# Patient Record
Sex: Female | Born: 1980 | Race: White | Hispanic: No | Marital: Single | Smoking: Current every day smoker
Health system: Southern US, Community
[De-identification: ages and names within clinical notes are randomized; demographics above are authoritative.]

## PROBLEM LIST (undated history)

## (undated) ENCOUNTER — Emergency Department (HOSPITAL_COMMUNITY): Admission: EM | Discharge status: Absent without leave | Payer: No Typology Code available for payment source

## (undated) DIAGNOSIS — F32A Depression, unspecified: Secondary | ICD-10-CM

## (undated) DIAGNOSIS — F329 Major depressive disorder, single episode, unspecified: Secondary | ICD-10-CM

## (undated) DIAGNOSIS — F410 Panic disorder [episodic paroxysmal anxiety] without agoraphobia: Secondary | ICD-10-CM

## (undated) DIAGNOSIS — B1920 Unspecified viral hepatitis C without hepatic coma: Secondary | ICD-10-CM

## (undated) DIAGNOSIS — F319 Bipolar disorder, unspecified: Secondary | ICD-10-CM

## (undated) DIAGNOSIS — F1910 Other psychoactive substance abuse, uncomplicated: Secondary | ICD-10-CM

## (undated) DIAGNOSIS — B009 Herpesviral infection, unspecified: Secondary | ICD-10-CM

## (undated) DIAGNOSIS — F988 Other specified behavioral and emotional disorders with onset usually occurring in childhood and adolescence: Secondary | ICD-10-CM

## (undated) HISTORY — PX: ANKLE SURGERY: SHX546

## (undated) HISTORY — PX: TUBAL LIGATION: SHX77

## (undated) HISTORY — PX: CHEST TUBE INSERTION: SHX231

---
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
## 2000-02-27 ENCOUNTER — Inpatient Hospital Stay (HOSPITAL_COMMUNITY): Admission: AD | Admit: 2000-02-27 | Discharge: 2000-02-27 | Payer: Self-pay | Admitting: Obstetrics

## 2000-03-01 ENCOUNTER — Inpatient Hospital Stay (HOSPITAL_COMMUNITY): Admission: AD | Admit: 2000-03-01 | Discharge: 2000-03-01 | Payer: Self-pay | Admitting: Obstetrics

## 2000-03-04 ENCOUNTER — Inpatient Hospital Stay (HOSPITAL_COMMUNITY): Admission: AD | Admit: 2000-03-04 | Discharge: 2000-03-04 | Payer: Self-pay | Admitting: Obstetrics

## 2000-03-11 ENCOUNTER — Inpatient Hospital Stay (HOSPITAL_COMMUNITY): Admission: AD | Admit: 2000-03-11 | Discharge: 2000-03-14 | Payer: Self-pay | Admitting: Obstetrics

## 2000-04-14 ENCOUNTER — Encounter: Payer: Self-pay | Admitting: *Deleted

## 2000-04-14 ENCOUNTER — Inpatient Hospital Stay (HOSPITAL_COMMUNITY): Admission: EM | Admit: 2000-04-14 | Discharge: 2000-04-19 | Payer: Self-pay

## 2000-04-16 ENCOUNTER — Encounter: Payer: Self-pay | Admitting: General Surgery

## 2000-04-16 ENCOUNTER — Encounter: Payer: Self-pay | Admitting: Orthopedic Surgery

## 2000-04-16 ENCOUNTER — Encounter: Payer: Self-pay | Admitting: Surgery

## 2000-07-18 ENCOUNTER — Emergency Department (HOSPITAL_COMMUNITY): Admission: EM | Admit: 2000-07-18 | Discharge: 2000-07-18 | Payer: Self-pay | Admitting: Emergency Medicine

## 2000-08-02 ENCOUNTER — Encounter: Admission: RE | Admit: 2000-08-02 | Discharge: 2000-09-12 | Payer: Self-pay | Admitting: Orthopedic Surgery

## 2001-05-21 ENCOUNTER — Other Ambulatory Visit: Admission: RE | Admit: 2001-05-21 | Discharge: 2001-05-21 | Payer: Self-pay | Admitting: Family Medicine

## 2001-05-22 ENCOUNTER — Other Ambulatory Visit: Admission: RE | Admit: 2001-05-22 | Discharge: 2001-05-22 | Payer: Self-pay | Admitting: Family Medicine

## 2001-11-19 ENCOUNTER — Encounter: Admission: RE | Admit: 2001-11-19 | Discharge: 2001-12-04 | Payer: Self-pay | Admitting: Orthopedic Surgery

## 2002-04-29 ENCOUNTER — Other Ambulatory Visit: Admission: RE | Admit: 2002-04-29 | Discharge: 2002-04-29 | Payer: Self-pay | Admitting: Family Medicine

## 2003-09-12 ENCOUNTER — Emergency Department (HOSPITAL_COMMUNITY): Admission: AC | Admit: 2003-09-12 | Discharge: 2003-09-12 | Payer: Self-pay

## 2004-06-21 ENCOUNTER — Other Ambulatory Visit: Admission: RE | Admit: 2004-06-21 | Discharge: 2004-06-21 | Payer: Self-pay | Admitting: *Deleted

## 2004-06-21 ENCOUNTER — Other Ambulatory Visit: Admission: RE | Admit: 2004-06-21 | Discharge: 2004-06-21 | Payer: Self-pay | Admitting: Family Medicine

## 2004-09-30 ENCOUNTER — Other Ambulatory Visit: Admission: RE | Admit: 2004-09-30 | Discharge: 2004-09-30 | Payer: Self-pay | Admitting: Family Medicine

## 2008-02-15 ENCOUNTER — Inpatient Hospital Stay (HOSPITAL_COMMUNITY): Admission: AD | Admit: 2008-02-15 | Discharge: 2008-02-15 | Payer: Self-pay | Admitting: Obstetrics & Gynecology

## 2008-02-18 ENCOUNTER — Inpatient Hospital Stay (HOSPITAL_COMMUNITY): Admission: AD | Admit: 2008-02-18 | Discharge: 2008-02-18 | Payer: Self-pay | Admitting: Obstetrics & Gynecology

## 2008-04-22 ENCOUNTER — Ambulatory Visit (HOSPITAL_COMMUNITY): Admission: RE | Admit: 2008-04-22 | Discharge: 2008-04-22 | Payer: Self-pay | Admitting: Obstetrics and Gynecology

## 2008-07-16 ENCOUNTER — Ambulatory Visit (HOSPITAL_COMMUNITY): Admission: RE | Admit: 2008-07-16 | Discharge: 2008-07-16 | Payer: Self-pay | Admitting: Obstetrics

## 2008-09-01 ENCOUNTER — Emergency Department (HOSPITAL_COMMUNITY): Admission: EM | Admit: 2008-09-01 | Discharge: 2008-09-01 | Payer: Self-pay | Admitting: Emergency Medicine

## 2008-11-18 ENCOUNTER — Inpatient Hospital Stay (HOSPITAL_COMMUNITY): Admission: RE | Admit: 2008-11-18 | Discharge: 2008-11-21 | Payer: Self-pay | Admitting: Obstetrics

## 2008-11-19 ENCOUNTER — Encounter (INDEPENDENT_AMBULATORY_CARE_PROVIDER_SITE_OTHER): Payer: Self-pay | Admitting: Obstetrics

## 2009-12-09 ENCOUNTER — Encounter: Admission: RE | Admit: 2009-12-09 | Discharge: 2009-12-09 | Payer: Self-pay | Admitting: Unknown Physician Specialty

## 2010-02-27 ENCOUNTER — Emergency Department (HOSPITAL_BASED_OUTPATIENT_CLINIC_OR_DEPARTMENT_OTHER)
Admission: EM | Admit: 2010-02-27 | Discharge: 2010-02-27 | Payer: Self-pay | Source: Home / Self Care | Admitting: Emergency Medicine

## 2010-03-10 ENCOUNTER — Emergency Department (HOSPITAL_BASED_OUTPATIENT_CLINIC_OR_DEPARTMENT_OTHER)
Admission: EM | Admit: 2010-03-10 | Discharge: 2010-03-10 | Disposition: A | Discharge status: Home / Self Care | Payer: Medicaid Other | Attending: Emergency Medicine | Admitting: Emergency Medicine

## 2010-03-10 DIAGNOSIS — G8929 Other chronic pain: Secondary | ICD-10-CM | POA: Insufficient documentation

## 2010-03-10 DIAGNOSIS — M79609 Pain in unspecified limb: Secondary | ICD-10-CM | POA: Insufficient documentation

## 2010-03-10 DIAGNOSIS — F17200 Nicotine dependence, unspecified, uncomplicated: Secondary | ICD-10-CM | POA: Insufficient documentation

## 2010-03-10 DIAGNOSIS — K602 Anal fissure, unspecified: Secondary | ICD-10-CM | POA: Insufficient documentation

## 2010-05-09 ENCOUNTER — Encounter: Payer: Medicaid Other | Attending: Physical Medicine & Rehabilitation

## 2010-05-09 ENCOUNTER — Ambulatory Visit: Payer: Medicaid Other | Admitting: Physical Medicine & Rehabilitation

## 2010-05-12 LAB — CBC
Hemoglobin: 10.3 g/dL — ABNORMAL LOW (ref 12.0–15.0)
MCHC: 33.4 g/dL (ref 30.0–36.0)
MCHC: 34 g/dL (ref 30.0–36.0)
MCHC: 34 g/dL (ref 30.0–36.0)
MCV: 88.6 fL (ref 78.0–100.0)
MCV: 89.4 fL (ref 78.0–100.0)
MCV: 90 fL (ref 78.0–100.0)
Platelets: 359 10*3/uL (ref 150–400)
Platelets: 412 10*3/uL — ABNORMAL HIGH (ref 150–400)
RBC: 3.42 MIL/uL — ABNORMAL LOW (ref 3.87–5.11)
RDW: 13 % (ref 11.5–15.5)
RDW: 13.4 % (ref 11.5–15.5)
WBC: 14.5 10*3/uL — ABNORMAL HIGH (ref 4.0–10.5)
WBC: 14.6 10*3/uL — ABNORMAL HIGH (ref 4.0–10.5)

## 2010-05-12 LAB — COMPREHENSIVE METABOLIC PANEL
ALT: 8 U/L (ref 0–35)
AST: 19 U/L (ref 0–37)
Calcium: 8.2 mg/dL — ABNORMAL LOW (ref 8.4–10.5)
Creatinine, Ser: 0.53 mg/dL (ref 0.4–1.2)
GFR calc Af Amer: >60 mL/min (ref >60)
Sodium: 134 mEq/L — ABNORMAL LOW (ref 135–145)
Total Protein: 5.2 g/dL — ABNORMAL LOW (ref 6.0–8.3)

## 2010-05-12 LAB — LACTATE DEHYDROGENASE: LDH: 148 U/L (ref 94–250)

## 2010-05-12 LAB — RPR: RPR Ser Ql: NONREACTIVE

## 2010-05-23 LAB — CBC
HCT: 40.3 % (ref 36.0–46.0)
MCHC: 33.7 g/dL (ref 30.0–36.0)
MCV: 89.3 fL (ref 78.0–100.0)
RBC: 4.51 MIL/uL (ref 3.87–5.11)
WBC: 11.6 10*3/uL — ABNORMAL HIGH (ref 4.0–10.5)

## 2010-05-23 LAB — GC/CHLAMYDIA PROBE AMP, GENITAL: GC Probe Amp, Genital: NEGATIVE

## 2010-05-23 LAB — HCG, QUANTITATIVE, PREGNANCY: hCG, Beta Chain, Quant, S: 205 m[IU]/mL — ABNORMAL HIGH (ref <5)

## 2010-05-23 LAB — WET PREP, GENITAL
Trich, Wet Prep: NONE SEEN
Yeast Wet Prep HPF POC: NONE SEEN

## 2010-05-23 LAB — POCT PREGNANCY, URINE: Preg Test, Ur: NEGATIVE
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
## 2010-06-24 NOTE — Op Note (Signed)
[place]  Patient:    Leslie [name], Leslie [name]                          MRN: [id number] Proc. Date: 04/14/00 Adm. Date:  [id number] Attending:  Trauma, Md                           Operative Report  PREOPERATIVE DIAGNOSIS:  Distal tibia and fibula fracture (plafond fracture).  POSTOPERATIVE DIAGNOSIS:  Distal tibia and fibula fracture (plafond fracture).  PROCEDURES: 1. Open reduction and internal fixation with external fixation of distal tibia    and fibula fracture. 2. Open reduction of fibula fracture. 3. Irrigation and debridement, open fracture.  SURGEON:  Sharlot Gowda., M.D.  ASSISTANT:  Jamelle Rushing, P.A.  TOURNIQUET TIME:  Approximately 1 hour 40 minutes.  DESCRIPTION OF PROCEDURE:  The patient had a severe fracture of the distal tibia, including the weightbearing surface of the tibia and fracture. Revision level of reduction was carried out to reduce what was really a fracture-dislocation.  This was followed by a placement of a Taylor calcaneal pin with the Orthofix fixator, followed by using the fixator as a template, filling the appropriate length and depth with screws.  The fixator was placed, followed by provisional reduction with longitudinal traction and forward movement of the tibia.  Reduction was confirmed to be essentially anatomic, AP, lateral, mortise positions.  The fibula was next approached, and there was a small 3 cm incision linear over the fibular fracture at the place where the fracture was open.  This was extended, irrigation with 3000 cc carried out, and there was no gross contamination visible.  The fibula was plated with a seven-hole plate.  Two of the intermediate screws could not be used secondary to comminution of the fracture, and reduction confirmed in three planes, three views, followed by a closure of the wound with 0 nylon, closure over a drain. The skin was closed using a stapling device.  Marcaine  _____ epinephrine was infiltrated in the skin.  Dressings applied over the pin sites.  Taken to the recovery room in stable condition. DD:  04/14/00 TD:  04/16/00 Job: [id number]

## 2010-07-07 ENCOUNTER — Ambulatory Visit: Payer: Medicaid Other | Admitting: Gastroenterology

## 2012-08-15 ENCOUNTER — Encounter (HOSPITAL_BASED_OUTPATIENT_CLINIC_OR_DEPARTMENT_OTHER): Payer: Self-pay

## 2012-08-15 ENCOUNTER — Emergency Department (HOSPITAL_BASED_OUTPATIENT_CLINIC_OR_DEPARTMENT_OTHER)
Admission: EM | Admit: 2012-08-15 | Discharge: 2012-08-15 | Disposition: A | Discharge status: Home / Self Care | Payer: Medicaid Other | Attending: Emergency Medicine | Admitting: Emergency Medicine

## 2012-08-15 DIAGNOSIS — F17200 Nicotine dependence, unspecified, uncomplicated: Secondary | ICD-10-CM | POA: Insufficient documentation

## 2012-08-15 DIAGNOSIS — Z113 Encounter for screening for infections with a predominantly sexual mode of transmission: Secondary | ICD-10-CM | POA: Insufficient documentation

## 2012-08-15 DIAGNOSIS — Z202 Contact with and (suspected) exposure to infections with a predominantly sexual mode of transmission: Secondary | ICD-10-CM | POA: Insufficient documentation

## 2012-08-15 DIAGNOSIS — F1910 Other psychoactive substance abuse, uncomplicated: Secondary | ICD-10-CM

## 2012-08-15 DIAGNOSIS — M549 Dorsalgia, unspecified: Secondary | ICD-10-CM | POA: Insufficient documentation

## 2012-08-15 DIAGNOSIS — R410 Disorientation, unspecified: Secondary | ICD-10-CM

## 2012-08-15 DIAGNOSIS — Z3202 Encounter for pregnancy test, result negative: Secondary | ICD-10-CM | POA: Insufficient documentation

## 2012-08-15 DIAGNOSIS — L293 Anogenital pruritus, unspecified: Secondary | ICD-10-CM | POA: Insufficient documentation

## 2012-08-15 DIAGNOSIS — R109 Unspecified abdominal pain: Secondary | ICD-10-CM | POA: Insufficient documentation

## 2012-08-15 DIAGNOSIS — F05 Delirium due to known physiological condition: Secondary | ICD-10-CM | POA: Insufficient documentation

## 2012-08-15 DIAGNOSIS — Z8659 Personal history of other mental and behavioral disorders: Secondary | ICD-10-CM | POA: Insufficient documentation

## 2012-08-15 HISTORY — DX: Other psychoactive substance abuse, uncomplicated: F19.10

## 2012-08-15 HISTORY — DX: Other specified behavioral and emotional disorders with onset usually occurring in childhood and adolescence: F98.8

## 2012-08-15 HISTORY — DX: Panic disorder (episodic paroxysmal anxiety): F41.0

## 2012-08-15 LAB — URINALYSIS, ROUTINE W REFLEX MICROSCOPIC
Bilirubin Urine: NEGATIVE
Nitrite: NEGATIVE
Specific Gravity, Urine: 1.013 (ref 1.005–1.030)
pH: 7 (ref 5.0–8.0)

## 2012-08-15 LAB — CBC WITH DIFFERENTIAL/PLATELET
Lymphocytes Relative: 16 % (ref 12–46)
Lymphs Abs: 3 10*3/uL (ref 0.7–4.0)
MCV: 85.8 fL (ref 78.0–100.0)
Neutro Abs: 14.1 10*3/uL — ABNORMAL HIGH (ref 1.7–7.7)
Neutrophils Relative %: 77 % (ref 43–77)
Platelets: 336 10*3/uL (ref 150–400)
RBC: 5.06 MIL/uL (ref 3.87–5.11)
WBC: 18.2 10*3/uL — ABNORMAL HIGH (ref 4.0–10.5)

## 2012-08-15 LAB — COMPREHENSIVE METABOLIC PANEL
ALT: 28 U/L (ref 0–35)
Alkaline Phosphatase: 83 U/L (ref 39–117)
CO2: 28 mEq/L (ref 19–32)
Chloride: 101 mEq/L (ref 96–112)
GFR calc Af Amer: >90 mL/min (ref >90)
GFR calc non Af Amer: >90 mL/min (ref >90)
Glucose, Bld: 124 mg/dL — ABNORMAL HIGH (ref 70–99)
Potassium: 4.4 mEq/L (ref 3.5–5.1)
Sodium: 142 mEq/L (ref 135–145)

## 2012-08-15 LAB — URINE MICROSCOPIC-ADD ON

## 2012-08-15 LAB — PREGNANCY, URINE: Preg Test, Ur: NEGATIVE

## 2012-08-15 LAB — WET PREP, GENITAL: WBC, Wet Prep HPF POC: NONE SEEN

## 2012-08-15 LAB — RAPID URINE DRUG SCREEN, HOSP PERFORMED
Barbiturates: NOT DETECTED
Cocaine: NOT DETECTED

## 2012-08-15 LAB — ACETAMINOPHEN LEVEL: Acetaminophen (Tylenol), Serum: <15 ug/mL (ref 10–30)

## 2012-08-15 NOTE — ED Notes (Signed)
Melissa from [facility] Child [employer] [facility] returned phone call. Melissa states she spoke with supervisor and has planned to schedule a meeting to meet with family tomorrow morning at their home and is trying to contact the child's father. Phone given to child's father in waiting room to speak with Melissa. Efraim Kaufmann states father is in agreement to not leave child with mother unsupervised tonight and that DSS social worker has planned to meet with family in the morning to evaluate & discuss the situation further. MD and PA-C providing care for patient made aware.

## 2012-08-15 NOTE — ED Notes (Signed)
Patient states she has back pain, denies injury, states she is having increased panic attacks and has been very spacey.  States she may have accidentially taken opiates yesterday.  Pt have current track marks on her bilateral wrists, states she can not remember when she last took heroin, "maybe one month or two" and does not know why the track marks on her arms are not getting better.  Requests medications for panic attacks and for sleeping.  States she has not been exposed to any std, won't make eye contact when questioned, states she just wants to be checked for itching.  Pt is very vague about her answers to questions and changes her answers to the questions.

## 2012-08-15 NOTE — ED Notes (Addendum)
Patient A&O x 4. Pt reports she would like resources for counseling and outpatient services. 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 Pt states she has tried other inpatient services and not been successful. Pt reports hx depression but denies and HI, SI, or other psychiatric complaints. Dahlia Client, PA-C and Dr. Bebe Shaggy made aware of patient requests.

## 2012-08-15 NOTE — ED Notes (Signed)
This RN spoke with Efraim Kaufmann, social worker from [facility] concerning patient care for patient's 32 year-old child. Melissa states she will speak with her on-call supervisor and discuss the situation. Melissa also states her intentions to speak with child's father to develop safety plan and discuss  follow-up. Melissa will call back after speaking with supervisor.
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
## 2012-08-15 NOTE — ED Notes (Signed)
[place] from [facility] Department Child Protective Services called concerning care for patient's 32 year-old daughter. Social worker on-call will return phone call.

## 2012-08-17 NOTE — ED Provider Notes (Signed)
Medical screening examination/treatment/procedure(s) were conducted as a shared visit with non-physician practitioner(s) and myself.  I personally evaluated the patient during the encounter   Joya Gaskins, MD 08/17/12 [phone]

## 2012-09-05 ENCOUNTER — Emergency Department (HOSPITAL_COMMUNITY)
Admission: EM | Admit: 2012-09-05 | Discharge: 2012-09-06 | Disposition: A | Discharge status: Home / Self Care | Payer: MEDICAID | Attending: Emergency Medicine | Admitting: Emergency Medicine

## 2012-09-05 ENCOUNTER — Encounter (HOSPITAL_COMMUNITY): Payer: Self-pay | Admitting: Emergency Medicine

## 2012-09-05 ENCOUNTER — Encounter (HOSPITAL_COMMUNITY): Payer: Self-pay | Admitting: *Deleted

## 2012-09-05 ENCOUNTER — Ambulatory Visit (HOSPITAL_COMMUNITY)
Admission: RE | Admit: 2012-09-05 | Discharge: 2012-09-05 | Disposition: A | Discharge status: Home / Self Care | Payer: MEDICAID | Attending: Psychiatry | Admitting: Psychiatry

## 2012-09-05 DIAGNOSIS — K5900 Constipation, unspecified: Secondary | ICD-10-CM | POA: Insufficient documentation

## 2012-09-05 DIAGNOSIS — F329 Major depressive disorder, single episode, unspecified: Secondary | ICD-10-CM

## 2012-09-05 DIAGNOSIS — Z79899 Other long term (current) drug therapy: Secondary | ICD-10-CM | POA: Insufficient documentation

## 2012-09-05 DIAGNOSIS — R61 Generalized hyperhidrosis: Secondary | ICD-10-CM | POA: Insufficient documentation

## 2012-09-05 DIAGNOSIS — R509 Fever, unspecified: Secondary | ICD-10-CM | POA: Insufficient documentation

## 2012-09-05 DIAGNOSIS — F3289 Other specified depressive episodes: Secondary | ICD-10-CM | POA: Insufficient documentation

## 2012-09-05 DIAGNOSIS — IMO0001 Reserved for inherently not codable concepts without codable children: Secondary | ICD-10-CM | POA: Insufficient documentation

## 2012-09-05 DIAGNOSIS — Z8659 Personal history of other mental and behavioral disorders: Secondary | ICD-10-CM | POA: Insufficient documentation

## 2012-09-05 DIAGNOSIS — F1910 Other psychoactive substance abuse, uncomplicated: Secondary | ICD-10-CM

## 2012-09-05 DIAGNOSIS — F410 Panic disorder [episodic paroxysmal anxiety] without agoraphobia: Secondary | ICD-10-CM | POA: Insufficient documentation

## 2012-09-05 DIAGNOSIS — F17200 Nicotine dependence, unspecified, uncomplicated: Secondary | ICD-10-CM | POA: Insufficient documentation

## 2012-09-05 DIAGNOSIS — Z3202 Encounter for pregnancy test, result negative: Secondary | ICD-10-CM | POA: Insufficient documentation

## 2012-09-05 DIAGNOSIS — F411 Generalized anxiety disorder: Secondary | ICD-10-CM | POA: Insufficient documentation

## 2012-09-05 DIAGNOSIS — M549 Dorsalgia, unspecified: Secondary | ICD-10-CM | POA: Insufficient documentation

## 2012-09-05 DIAGNOSIS — M79609 Pain in unspecified limb: Secondary | ICD-10-CM | POA: Insufficient documentation

## 2012-09-05 DIAGNOSIS — N390 Urinary tract infection, site not specified: Secondary | ICD-10-CM | POA: Insufficient documentation

## 2012-09-05 DIAGNOSIS — R259 Unspecified abnormal involuntary movements: Secondary | ICD-10-CM | POA: Insufficient documentation

## 2012-09-05 HISTORY — DX: Depression, unspecified: F32.A

## 2012-09-05 HISTORY — DX: Major depressive disorder, single episode, unspecified: F32.9

## 2012-09-05 LAB — URINE MICROSCOPIC-ADD ON

## 2012-09-05 LAB — COMPREHENSIVE METABOLIC PANEL
AST: 25 U/L (ref 0–37)
Alkaline Phosphatase: 86 U/L (ref 39–117)
BUN: 10 mg/dL (ref 6–23)
CO2: 23 mEq/L (ref 19–32)
Chloride: 102 mEq/L (ref 96–112)
Creatinine, Ser: 0.66 mg/dL (ref 0.50–1.10)
GFR calc non Af Amer: >90 mL/min (ref >90)
Potassium: 4.4 mEq/L (ref 3.5–5.1)
Total Bilirubin: 0.2 mg/dL — ABNORMAL LOW (ref 0.3–1.2)

## 2012-09-05 LAB — URINALYSIS, ROUTINE W REFLEX MICROSCOPIC
Glucose, UA: 100 mg/dL — AB
Hgb urine dipstick: NEGATIVE
Specific Gravity, Urine: 1.023 (ref 1.005–1.030)

## 2012-09-05 LAB — RAPID URINE DRUG SCREEN, HOSP PERFORMED
Amphetamines: POSITIVE — AB
Barbiturates: NOT DETECTED
Opiates: NOT DETECTED
Tetrahydrocannabinol: NOT DETECTED

## 2012-09-05 LAB — CBC
HCT: 45.5 % (ref 36.0–46.0)
MCV: 83.5 fL (ref 78.0–100.0)
Platelets: 316 10*3/uL (ref 150–400)
RBC: 5.45 MIL/uL — ABNORMAL HIGH (ref 3.87–5.11)
WBC: 14.6 10*3/uL — ABNORMAL HIGH (ref 4.0–10.5)

## 2012-09-05 LAB — POCT PREGNANCY, URINE: Preg Test, Ur: NEGATIVE

## 2012-09-05 MED ORDER — LORAZEPAM 1 MG PO TABS
1.0000 mg | ORAL_TABLET | Freq: Three times a day (TID) | ORAL | Status: DC | PRN
  Administered 2012-09-05: 1 mg via ORAL
  Filled 2012-09-05: qty 1

## 2012-09-05 MED ORDER — CLONIDINE HCL 0.1 MG PO TABS: 0.1000 mg | ORAL_TABLET | Freq: Every day | ORAL | Status: DC

## 2012-09-05 MED ORDER — METHOCARBAMOL 500 MG PO TABS
500.0000 mg | ORAL_TABLET | Freq: Three times a day (TID) | ORAL | Status: DC | PRN
  Administered 2012-09-05: 500 mg via ORAL
  Filled 2012-09-05: qty 1

## 2012-09-05 MED ORDER — NAPROXEN 500 MG PO TABS: 500.0000 mg | ORAL_TABLET | Freq: Two times a day (BID) | ORAL | Status: DC | PRN

## 2012-09-05 MED ORDER — ALUM & MAG HYDROXIDE-SIMETH 200-200-20 MG/5ML PO SUSP
30.0000 mL | ORAL | Status: DC | PRN
  Administered 2012-09-05: 30 mL via ORAL
  Filled 2012-09-05: qty 30

## 2012-09-05 MED ORDER — NITROFURANTOIN MONOHYD MACRO 100 MG PO CAPS
100.0000 mg | ORAL_CAPSULE | Freq: Two times a day (BID) | ORAL | Status: DC
  Administered 2012-09-05 – 2012-09-06 (×2): 100 mg via ORAL
  Filled 2012-09-05 (×3): qty 1

## 2012-09-05 MED ORDER — IBUPROFEN 200 MG PO TABS: 600.0000 mg | ORAL_TABLET | Freq: Three times a day (TID) | ORAL | Status: DC | PRN

## 2012-09-05 MED ORDER — NICOTINE 21 MG/24HR TD PT24
21.0000 mg | MEDICATED_PATCH | Freq: Every day | TRANSDERMAL | Status: DC
  Administered 2012-09-05: 21 mg via TRANSDERMAL
  Filled 2012-09-05: qty 1

## 2012-09-05 MED ORDER — LOPERAMIDE HCL 2 MG PO CAPS: 2.0000 mg | ORAL_CAPSULE | ORAL | Status: DC | PRN

## 2012-09-05 MED ORDER — ONDANSETRON HCL 4 MG PO TABS: 4.0000 mg | ORAL_TABLET | Freq: Three times a day (TID) | ORAL | Status: DC | PRN

## 2012-09-05 MED ORDER — CLONIDINE HCL 0.1 MG PO TABS: 0.1000 mg | ORAL_TABLET | ORAL | Status: DC

## 2012-09-05 MED ORDER — ZOLPIDEM TARTRATE 5 MG PO TABS
5.0000 mg | ORAL_TABLET | Freq: Every evening | ORAL | Status: DC | PRN
  Administered 2012-09-05: 5 mg via ORAL
  Filled 2012-09-05: qty 1

## 2012-09-05 MED ORDER — DICYCLOMINE HCL 20 MG PO TABS: 20.0000 mg | ORAL_TABLET | Freq: Four times a day (QID) | ORAL | Status: DC | PRN

## 2012-09-05 MED ORDER — CLONIDINE HCL 0.1 MG PO TABS
0.1000 mg | ORAL_TABLET | Freq: Four times a day (QID) | ORAL | Status: DC
  Administered 2012-09-05: 0.1 mg via ORAL
  Filled 2012-09-05: qty 1

## 2012-09-05 MED ORDER — HYDROXYZINE HCL 25 MG PO TABS: 25.0000 mg | ORAL_TABLET | Freq: Four times a day (QID) | ORAL | Status: DC | PRN
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
## 2012-09-05 NOTE — ED Provider Notes (Signed)
CSN: [id number]     Arrival date & time 09/05/12  4098 History  This chart was scribed for Ivonne Andrew, PA-C working with Loren Racer, MD by Greggory Stallion, ED scribe. This patient was seen in room WTR3/WLPT3 and the patient's care was started at 8:14 PM.   Chief Complaint  Patient presents with  . Medical Clearance   The history is provided by the patient. No language interpreter was used.    HPI Comments: History provided by the patient. Leslie [name] is a 32 y.o. female who presents to the Emergency Department for medical clearance. Pt states she was sent here from [facility]. She states she has been abusing suboxone, valium, klonopin, and cocaine. Pt states some of them are prescribed to her. She states she is minimizing use but would like treatment for them. Pt states she is also here for depression. She denies SI/HI. Pt states she is having back pain and leg pain. She denies injury. Pt states she is having fever, chills, sweats, anxiety, tremors and constipation. She denies abdominal pain, leg swelling and rash as associated symptoms.  She states it has been a long time since she used IV drugs. No other aggravating or alleviating factors. No other associated symptoms.   Past Medical History  Diagnosis Date  . Substance abuse   . Panic attack   . ADD (attention deficit disorder)   . Depression    Past Surgical History  Procedure Laterality Date  . Ankle surgery    . Chest tube insertion    . Tubal ligation     Family History  Problem Relation Age of Onset  . Hypertension Father   . Diabetes Other    History  Substance Use Topics  . Smoking status: Current Every Day Smoker -- 1.00 packs/day    Types: Cigarettes  . Smokeless tobacco: Not on file  . Alcohol Use: No     Comment: former   OB History   Grav Para Term Preterm Abortions TAB SAB Ect Mult Living                 Review of Systems  Constitutional: Positive for fever and chills.  Cardiovascular:  Negative for leg swelling.  Gastrointestinal: Positive for constipation. Negative for abdominal pain.  Musculoskeletal: Positive for myalgias and back pain.  Skin: Negative for rash.  Neurological: Positive for tremors.  Psychiatric/Behavioral: Negative for suicidal ideas. The patient is nervous/anxious.   All other systems reviewed and are negative.    Allergies  Cymbalta  Home Medications   Current Outpatient Rx  Name  Route  Sig  Dispense  Refill  . acetaminophen (TYLENOL) 500 MG tablet   Oral   Take 500 mg by mouth every 6 (six) hours as needed for pain.         Marland Kitchen amphetamine-dextroamphetamine (ADDERALL) 30 MG tablet   Oral   Take 30 mg by mouth daily.         . buprenorphine-naloxone (SUBOXONE) 8-2 MG SUBL SL tablet   Sublingual   Place under the tongue daily.         . clonazePAM (KLONOPIN) 0.5 MG tablet   Oral   Take 0.5 mg by mouth 2 (two) times daily as needed for anxiety.          BP 173/98  Pulse 116  Temp(Src) 99.1 F (37.3 C) (Oral)  Resp 24  Ht 5\' 4"  (1.626 m)  Wt 115 lb 8 oz (52.39 kg)  BMI 19.82  kg/m2  SpO2 100%  LMP 08/04/2012  Physical Exam  Nursing note and vitals reviewed. Constitutional: She is oriented to person, place, and time. She appears well-developed and well-nourished. No distress.  HENT:  Head: Normocephalic and atraumatic.  Eyes: EOM are normal.  Neck: Normal range of motion. Neck supple. No tracheal deviation present.  Cardiovascular: Normal rate, regular rhythm and normal heart sounds.   Pulmonary/Chest: Effort normal and breath sounds normal. No respiratory distress. She has no wheezes. She has no rales.  Musculoskeletal: Normal range of motion.  No swelling to bilateral legs.   Neurological: She is alert and oriented to person, place, and time.  Skin: Skin is warm and dry. No erythema.  Psychiatric: Her behavior is normal.    ED Course   Procedures   DIAGNOSTIC STUDIES: Oxygen Saturation is 100% on RA, normal  by my interpretation.    COORDINATION OF CARE: 8:31 PM-Discussed treatment plan which includes possible treatment as outpatient for substance abuse or moving to the back of the ED. Pt would like to stay for acute help with symptoms and for help with depression.   Results for orders placed during the hospital encounter of 09/05/12  CBC      Result Value Range   WBC 14.6 (*) 4.0 - 10.5 K/uL   RBC 5.45 (*) 3.87 - 5.11 MIL/uL   Hemoglobin 15.8 (*) 12.0 - 15.0 g/dL   HCT 16.1  09.6 - 04.5 %   MCV 83.5  78.0 - 100.0 fL   MCH 29.0  26.0 - 34.0 pg   MCHC 34.7  30.0 - 36.0 g/dL   RDW 40.9  81.1 - 91.4 %   Platelets 316  150 - 400 K/uL  COMPREHENSIVE METABOLIC PANEL      Result Value Range   Sodium 137  135 - 145 mEq/L   Potassium 4.4  3.5 - 5.1 mEq/L   Chloride 102  96 - 112 mEq/L   CO2 23  19 - 32 mEq/L   Glucose, Bld 92  70 - 99 mg/dL   BUN 10  6 - 23 mg/dL   Creatinine, Ser 7.82  0.50 - 1.10 mg/dL   Calcium 9.9  8.4 - 95.6 mg/dL   Total Protein 8.3  6.0 - 8.3 g/dL   Albumin 4.0  3.5 - 5.2 g/dL   AST 25  0 - 37 U/L   ALT 41 (*) 0 - 35 U/L   Alkaline Phosphatase 86  39 - 117 U/L   Total Bilirubin 0.2 (*) 0.3 - 1.2 mg/dL   GFR calc non Af Amer >90  >90 mL/min   GFR calc Af Amer >90  >90 mL/min  ETHANOL      Result Value Range   Alcohol, Ethyl (B) <11  0 - 11 mg/dL  URINE RAPID DRUG SCREEN (HOSP PERFORMED)      Result Value Range   Opiates NONE DETECTED  NONE DETECTED   Cocaine NONE DETECTED  NONE DETECTED   Benzodiazepines POSITIVE (*) NONE DETECTED   Amphetamines POSITIVE (*) NONE DETECTED   Tetrahydrocannabinol NONE DETECTED  NONE DETECTED   Barbiturates NONE DETECTED  NONE DETECTED  URINALYSIS, ROUTINE W REFLEX MICROSCOPIC      Result Value Range   Color, Urine YELLOW  YELLOW   APPearance TURBID (*) CLEAR   Specific Gravity, Urine 1.023  1.005 - 1.030   pH 7.0  5.0 - 8.0   Glucose, UA 100 (*) NEGATIVE mg/dL   Hgb urine dipstick NEGATIVE  NEGATIVE   Bilirubin Urine  NEGATIVE  NEGATIVE   Ketones, ur NEGATIVE  NEGATIVE mg/dL   Protein, ur NEGATIVE  NEGATIVE mg/dL   Urobilinogen, UA 0.2  0.0 - 1.0 mg/dL   Nitrite POSITIVE (*) NEGATIVE   Leukocytes, UA LARGE (*) NEGATIVE  URINE MICROSCOPIC-ADD ON      Result Value Range   WBC, UA 11-20  <3 WBC/hpf   Bacteria, UA MANY (*) RARE   Crystals CA OXALATE CRYSTALS (*) NEGATIVE  POCT PREGNANCY, URINE      Result Value Range   Preg Test, Ur NEGATIVE  NEGATIVE      1. Polysubstance abuse   2. Depression   3. UTI (lower urinary tract infection)      MDM  Pt seen and evaluated. Patient calm cooperative in no acute distress. Patient denies SI or HI. She is here voluntarily after being seen at [facility].    Patient moved to psych ED. Holding orders written.  Tele psych consult ordered.  Patient with signs of UTI. Macrobid started.     I personally performed the services described in this documentation, which was scribed in my presence. The recorded information has been reviewed and is accurate.   Angus Seller, PA-C 09/06/12 [phone]
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
## 2012-09-05 NOTE — ED Notes (Signed)
Pt is psychotic, she kept jumping and looking around the room and looking at the TV suspiciously, she said sometimes she understands the TV and others she doesn't. She said she has +ACH telling her to stay home which she said her house talks to her. She said she hears so much. She said she wanted to hurt certain people but denied voices telling her to do so and said she doesn't want to do it. She said she was treated for voices once with Seroquel but only took it twice. She has one previous suicide attempt

## 2012-09-05 NOTE — ED Notes (Signed)
Pt seen and wanded by security.
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
## 2012-09-05 NOTE — ED Notes (Signed)
Pt's sister has belongings

## 2012-09-05 NOTE — ED Notes (Signed)
Spoke to pharmacist, clonidine 0.1mg  at 2230 is duplicate order per pharmacist.

## 2012-09-05 NOTE — ED Notes (Signed)
Pt went to [facility] and was sent here for medical clearance  Pt has not been accepted by them and does not have a bed at this time  Pt has been abusing suboxone, valium, klonopin and cocaine  Has been minimizing use but is seeking treatment  Denies SI/HI

## 2012-09-05 NOTE — ED Notes (Signed)
Tried to call Misty Stanley, RN to get report she's in another room and will call me back.

## 2012-09-06 DIAGNOSIS — F1920 Other psychoactive substance dependence, uncomplicated: Secondary | ICD-10-CM

## 2012-09-06 MED ORDER — CEPHALEXIN 500 MG PO CAPS: 500.0000 mg | ORAL_CAPSULE | Freq: Four times a day (QID) | ORAL | Status: DC

## 2012-09-06 NOTE — ED Provider Notes (Signed)
Act team indicates pt has been assessed by psychiatry/psych team, and  That plan is that pt to be d/cd from ER, has been provided referrals for outpt rehab options.  On review labs, note made of uti. Will give rx for home.     Suzi Roots, MD 09/06/12 [phone]

## 2012-09-06 NOTE — Consult Note (Signed)
[facility] Psychiatry Consult   Reason for Consult:  Leslie [name] wants help getting off drugs and getting her life back Referring Physician:  ER MD  Leslie [name] is an 32 y.o. female.  Assessment: AXIS I:  polysubstance dependence,  AXIS II:  Deferred AXIS III:   Past Medical History  Diagnosis Date  . Substance abuse   . Panic attack   . ADD (attention deficit disorder)   . Depression    AXIS IV:  economic problems, occupational problems, other psychosocial or environmental problems, problems related to social environment, problems with access to health care services and problems with primary support group AXIS V:  51-60 moderate symptoms  Plan:  No evidence of imminent risk to self or others at present.   Patient does not meet criteria for psychiatric inpatient admission. Discussed crisis plan, support from social network, calling 911, coming to the Emergency Department, and calling Suicide Hotline. already has out patient followup and receives suboxone treatment, needs to follow up  with rehab  Subjective:   Leslie [name] is a 32 y.o. female patient admitted with depression and requesting help to get off drugs.  HPI:  Leslie [name] sees a therapist and a psychiatrist and is on Suboxone.  She recently lost her job and cannot afford to see her provicers anymore so she needs a new provider.  She has an extensive drug history using all classes of drugs and also dealing.  She has lost her children and is about to lose her house.  Consequently, she is stressed out.  She is not homicidal or homicidal  HPI Elements:   Location:  ER. Quality:  depression and stress from life circumstances. Severity:  moderate. Timing:  substance use is the underlying problem. Duration:  worse after she lost her job. Context:  no job, struggling marriage, about to lose her house, has lost her children.  Past Psychiatric History: Past Medical History  Diagnosis Date  . Substance abuse   . Panic attack   . ADD  (attention deficit disorder)   . Depression     reports that she has been smoking Cigarettes.  She has been smoking about 1.00 pack per day. She does not have any smokeless tobacco history on file. She reports that she uses illicit drugs (Cocaine, Opium, and Benzodiazepines). She reports that she does not drink alcohol. Family History  Problem Relation Age of Onset  . Hypertension Father   . Diabetes Other            Allergies:   Allergies  Allergen Reactions  . Cymbalta (Duloxetine Hcl)     Disoriented     Past Psychiatric History: Diagnosis:  Polysubstance dependence  Hospitalizations:  None reported  Outpatient Care:  Sees psychiatrist and therapist  Substance Abuse Care:  suboxone program  Self-Mutilation:  None reported  Suicidal Attempts:  None reported  Violent Behaviors:  none   Objective: Blood pressure 152/85, pulse 93, temperature 98.8 F (37.1 C), temperature source Oral, resp. rate 18, height 5\' 4"  (1.626 m), weight 52.39 kg (115 lb 8 oz), last menstrual period 08/04/2012, SpO2 100.00%.Body mass index is 19.82 kg/(m^2). Results for orders placed during the hospital encounter of 09/05/12 (from the past 72 hour(s))  URINE RAPID DRUG SCREEN (HOSP PERFORMED)     Status: Abnormal   Collection Time    09/05/12  7:59 PM      Result Value Range   Opiates NONE DETECTED  NONE DETECTED   Cocaine NONE DETECTED  NONE DETECTED   Benzodiazepines POSITIVE (*) NONE DETECTED   Amphetamines POSITIVE (*) NONE DETECTED   Tetrahydrocannabinol NONE DETECTED  NONE DETECTED   Barbiturates NONE DETECTED  NONE DETECTED   Comment:            DRUG SCREEN FOR MEDICAL PURPOSES     ONLY.  IF CONFIRMATION IS NEEDED     FOR ANY PURPOSE, NOTIFY LAB     WITHIN 5 DAYS.                LOWEST DETECTABLE LIMITS     FOR URINE DRUG SCREEN     Drug Class       Cutoff (ng/mL)     Amphetamine      1000     Barbiturate      200     Benzodiazepine   200     Tricyclics       300     Opiates           300     Cocaine          300     THC              50  URINALYSIS, ROUTINE W REFLEX MICROSCOPIC     Status: Abnormal   Collection Time    09/05/12  7:59 PM      Result Value Range   Color, Urine YELLOW  YELLOW   APPearance TURBID (*) CLEAR   Specific Gravity, Urine 1.023  1.005 - 1.030   pH 7.0  5.0 - 8.0   Glucose, UA 100 (*) NEGATIVE mg/dL   Hgb urine dipstick NEGATIVE  NEGATIVE   Bilirubin Urine NEGATIVE  NEGATIVE   Ketones, ur NEGATIVE  NEGATIVE mg/dL   Protein, ur NEGATIVE  NEGATIVE mg/dL   Urobilinogen, UA 0.2  0.0 - 1.0 mg/dL   Nitrite POSITIVE (*) NEGATIVE   Leukocytes, UA LARGE (*) NEGATIVE  URINE MICROSCOPIC-ADD ON     Status: Abnormal   Collection Time    09/05/12  7:59 PM      Result Value Range   WBC, UA 11-20  <3 WBC/hpf   Bacteria, UA MANY (*) RARE   Crystals CA OXALATE CRYSTALS (*) NEGATIVE  CBC     Status: Abnormal   Collection Time    09/05/12  8:05 PM      Result Value Range   WBC 14.6 (*) 4.0 - 10.5 K/uL   RBC 5.45 (*) 3.87 - 5.11 MIL/uL   Hemoglobin 15.8 (*) 12.0 - 15.0 g/dL   HCT 40.9  81.1 - 91.4 %   MCV 83.5  78.0 - 100.0 fL   MCH 29.0  26.0 - 34.0 pg   MCHC 34.7  30.0 - 36.0 g/dL   RDW 78.2  95.6 - 21.3 %   Platelets 316  150 - 400 K/uL  COMPREHENSIVE METABOLIC PANEL     Status: Abnormal   Collection Time    09/05/12  8:05 PM      Result Value Range   Sodium 137  135 - 145 mEq/L   Potassium 4.4  3.5 - 5.1 mEq/L   Chloride 102  96 - 112 mEq/L   CO2 23  19 - 32 mEq/L   Glucose, Bld 92  70 - 99 mg/dL   BUN 10  6 - 23 mg/dL   Creatinine, Ser 0.86  0.50 - 1.10 mg/dL   Calcium 9.9  8.4 - 57.8 mg/dL   Total Protein  8.3  6.0 - 8.3 g/dL   Albumin 4.0  3.5 - 5.2 g/dL   AST 25  0 - 37 U/L   ALT 41 (*) 0 - 35 U/L   Alkaline Phosphatase 86  39 - 117 U/L   Total Bilirubin 0.2 (*) 0.3 - 1.2 mg/dL   GFR calc non Af Amer >90  >90 mL/min   GFR calc Af Amer >90  >90 mL/min   Comment:            The eGFR has been calculated     using the CKD EPI  equation.     This calculation has not been     validated in all clinical     situations.     eGFR's persistently     <90 mL/min signify     possible Chronic Kidney Disease.  ETHANOL     Status: None   Collection Time    09/05/12  8:05 PM      Result Value Range   Alcohol, Ethyl (B) <11  0 - 11 mg/dL   Comment:            LOWEST DETECTABLE LIMIT FOR     SERUM ALCOHOL IS 11 mg/dL     FOR MEDICAL PURPOSES ONLY  POCT PREGNANCY, URINE     Status: None   Collection Time    09/05/12  8:06 PM      Result Value Range   Preg Test, Ur NEGATIVE  NEGATIVE   Comment:            THE SENSITIVITY OF THIS     METHODOLOGY IS >24 mIU/mL   Labs are reviewed and are pertinent for minor abnormalities probably associated with history of substance use.  Current Facility-Administered Medications  Medication Dose Route Frequency Provider Last Rate Last Dose  . alum & mag hydroxide-simeth (MAALOX/MYLANTA) 200-200-20 MG/5ML suspension 30 mL  30 mL Oral PRN Phill Mutter Dammen, PA-C   30 mL at 09/05/12 2257  . cloNIDine (CATAPRES) tablet 0.1 mg  0.1 mg Oral QID Loren Racer, MD   0.1 mg at 09/05/12 2258   Followed by  . [START ON 09/08/2012] cloNIDine (CATAPRES) tablet 0.1 mg  0.1 mg Oral BH-qamhs Loren Racer, MD       Followed by  . [START ON 09/10/2012] cloNIDine (CATAPRES) tablet 0.1 mg  0.1 mg Oral QAC breakfast Loren Racer, MD      . dicyclomine (BENTYL) tablet 20 mg  20 mg Oral Q6H PRN Angus Seller, PA-C      . hydrOXYzine (ATARAX/VISTARIL) tablet 25 mg  25 mg Oral Q6H PRN Angus Seller, PA-C      . ibuprofen (ADVIL,MOTRIN) tablet 600 mg  600 mg Oral Q8H PRN Phill Mutter Dammen, PA-C      . loperamide (IMODIUM) capsule 2-4 mg  2-4 mg Oral PRN Angus Seller, PA-C      . LORazepam (ATIVAN) tablet 1 mg  1 mg Oral Q8H PRN Angus Seller, PA-C   1 mg at 09/05/12 2259  . methocarbamol (ROBAXIN) tablet 500 mg  500 mg Oral Q8H PRN Angus Seller, PA-C   500 mg at 09/05/12 2258  . nicotine (NICODERM CQ -  dosed in mg/24 hours) patch 21 mg  21 mg Transdermal Daily Phill Mutter Dammen, PA-C   21 mg at 09/05/12 2257  . nitrofurantoin (macrocrystal-monohydrate) (MACROBID) capsule 100 mg  100 mg Oral Q12H Peter S Dammen, PA-C   100 mg at  09/05/12 2258  . ondansetron (ZOFRAN) tablet 4 mg  4 mg Oral Q8H PRN Angus Seller, PA-C      . zolpidem (AMBIEN) tablet 5 mg  5 mg Oral QHS PRN Angus Seller, PA-C   5 mg at 09/05/12 2258   Current Outpatient Prescriptions  Medication Sig Dispense Refill  . acetaminophen (TYLENOL) 500 MG tablet Take 500 mg by mouth every 6 (six) hours as needed for pain.      Marland Kitchen amphetamine-dextroamphetamine (ADDERALL) 30 MG tablet Take 30 mg by mouth daily.      . buprenorphine-naloxone (SUBOXONE) 8-2 MG SUBL SL tablet Place under the tongue daily.      . clonazePAM (KLONOPIN) 0.5 MG tablet Take 0.5 mg by mouth 2 (two) times daily as needed for anxiety.      . cephALEXin (KEFLEX) 500 MG capsule Take 1 capsule (500 mg total) by mouth 4 (four) times daily.  20 capsule  0    Psychiatric Specialty Exam:     Blood pressure 152/85, pulse 93, temperature 98.8 F (37.1 C), temperature source Oral, resp. rate 18, height 5\' 4"  (1.626 m), weight 52.39 kg (115 lb 8 oz), last menstrual period 08/04/2012, SpO2 100.00%.Body mass index is 19.82 kg/(m^2).  General Appearance: Casual  Eye Contact::  Good  Speech:  Clear and Coherent and Normal Rate  Volume:  Normal  Mood:  Anxious  Affect:  Appropriate  Thought Process:  Negative  Orientation:  Full (Time, Place, and Person)  Thought Content:  Negative  Suicidal Thoughts:  No  Homicidal Thoughts:  No  Memory:  Immediate;   Good Recent;   Good Remote;   Good  Judgement:  Fair  Insight:  Fair  Psychomotor Activity:  Normal  Concentration:  Good  Recall:  Good  Akathisia:  Negative  Handed:  Right  AIMS (if indicated):     Assets:  Desire for Improvement Physical Health  Sleep:      Treatment Plan Summary: Daily contact with patient  to assess and evaluate symptoms and progress in treatment Medication management will discharge and she will follow up with contacting drug rehab facility of her choice and her outpatient providers  Jazzie Trampe D 09/06/2012 8:41 AM

## 2012-09-06 NOTE — Progress Notes (Addendum)
CSW and rn provided patient with outpatient substance abuse resources. Pt psychiatrically stable or dc home per psychiatrist. Pt provided with cab voucher home.   Catha Gosselin, LCSWA  [phone] .09/06/2012 8:26am

## 2012-09-08 NOTE — ED Provider Notes (Signed)
Medical screening examination/treatment/procedure(s) were performed by non-physician practitioner and as supervising physician I was immediately available for consultation/collaboration.   Loren Racer, MD 09/08/12 [phone]

## 2012-09-17 LAB — URINE CULTURE

## 2012-09-18 NOTE — ED Notes (Signed)
+   Urine Culture Patient treated per protocol MD.  

## 2012-10-06 ENCOUNTER — Emergency Department (HOSPITAL_COMMUNITY)
Admission: EM | Admit: 2012-10-06 | Discharge: 2012-10-07 | Disposition: A | Discharge status: Home / Self Care | Payer: MEDICAID | Attending: Emergency Medicine | Admitting: Emergency Medicine

## 2012-10-06 ENCOUNTER — Encounter (HOSPITAL_COMMUNITY): Payer: Self-pay | Admitting: *Deleted

## 2012-10-06 DIAGNOSIS — F17200 Nicotine dependence, unspecified, uncomplicated: Secondary | ICD-10-CM | POA: Insufficient documentation

## 2012-10-06 DIAGNOSIS — F410 Panic disorder [episodic paroxysmal anxiety] without agoraphobia: Secondary | ICD-10-CM | POA: Insufficient documentation

## 2012-10-06 DIAGNOSIS — Z79899 Other long term (current) drug therapy: Secondary | ICD-10-CM | POA: Insufficient documentation

## 2012-10-06 DIAGNOSIS — Z3202 Encounter for pregnancy test, result negative: Secondary | ICD-10-CM | POA: Insufficient documentation

## 2012-10-06 DIAGNOSIS — F419 Anxiety disorder, unspecified: Secondary | ICD-10-CM

## 2012-10-06 LAB — CBC
HCT: 41.4 % (ref 36.0–46.0)
Hemoglobin: 13.8 g/dL (ref 12.0–15.0)
RDW: 12.9 % (ref 11.5–15.5)
WBC: 14.7 10*3/uL — ABNORMAL HIGH (ref 4.0–10.5)

## 2012-10-06 LAB — COMPREHENSIVE METABOLIC PANEL
Albumin: 3.4 g/dL — ABNORMAL LOW (ref 3.5–5.2)
Alkaline Phosphatase: 79 U/L (ref 39–117)
BUN: 13 mg/dL (ref 6–23)
Chloride: 103 mEq/L (ref 96–112)
Potassium: 4 mEq/L (ref 3.5–5.1)
Total Bilirubin: 0.1 mg/dL — ABNORMAL LOW (ref 0.3–1.2)

## 2012-10-06 LAB — POCT PREGNANCY, URINE: Preg Test, Ur: NEGATIVE

## 2012-10-06 LAB — RAPID URINE DRUG SCREEN, HOSP PERFORMED
Amphetamines: NOT DETECTED
Barbiturates: NOT DETECTED
Tetrahydrocannabinol: NOT DETECTED

## 2012-10-06 LAB — ETHANOL: Alcohol, Ethyl (B): <11 mg/dL (ref 0–11)

## 2012-10-06 MED ORDER — ALUM & MAG HYDROXIDE-SIMETH 200-200-20 MG/5ML PO SUSP: 30.0000 mL | ORAL | Status: DC | PRN

## 2012-10-06 MED ORDER — ACETAMINOPHEN 325 MG PO TABS: 650.0000 mg | ORAL_TABLET | ORAL | Status: DC | PRN

## 2012-10-06 MED ORDER — IBUPROFEN 200 MG PO TABS: 600.0000 mg | ORAL_TABLET | Freq: Three times a day (TID) | ORAL | Status: DC | PRN

## 2012-10-06 MED ORDER — ZOLPIDEM TARTRATE 5 MG PO TABS: 5.0000 mg | ORAL_TABLET | Freq: Every evening | ORAL | Status: DC | PRN

## 2012-10-06 MED ORDER — ONDANSETRON HCL 4 MG PO TABS: 4.0000 mg | ORAL_TABLET | Freq: Three times a day (TID) | ORAL | Status: DC | PRN

## 2012-10-06 MED ORDER — NICOTINE 21 MG/24HR TD PT24: 21.0000 mg | MEDICATED_PATCH | Freq: Every day | TRANSDERMAL | Status: DC | PRN

## 2012-10-06 NOTE — Progress Notes (Signed)
Patient ID: Leslie [name], female   DOB: Feb 19, 1980, 32 y.o.   MRN: [id number] Pt assessed by ACT/ Tammy Sours W-No SI No HI no psychosis.(See DR Taylor's consult 8/1 also).Seeking help with anxiety.NCCSRS reviewed .She had no evidence of abusing her prescriptions for suboxone ,clonazepam and adderall prior to losing her insurance. Her last clonazepam RX was 30 day rx  7/26 so she is in danger of W/D seizure as she has been on it over 1 year and she certainly is going to have withdrawal symptoms related to physical dependence.She has a physician she can see for care next week.She is given RX for # 28 0.5 mg clonazepam i po bid until she can see her physician.

## 2012-10-06 NOTE — ED Notes (Signed)
Report called to [facility] RN pt to go to room 43
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
## 2012-10-06 NOTE — ED Provider Notes (Addendum)
CSN: [id number]     Arrival date & time 10/06/12  2104 History   First MD Initiated Contact with Patient 10/06/12 2223     Chief Complaint  Patient presents with  . Medical Clearance   (Consider location/radiation/quality/duration/timing/severity/associated sxs/prior Treatment) HPI Comments: Leslie [name] is a 32 y.o. Female presents for evaluation of nervousness. Her symptoms are worsening since she stopped using her cocaine one month ago. Her children were taken away from her at that time. The Department of Social Services has recommended that she see a psychiatrist at that evaluation. She regular therapist and psychiatrist, but has not seen them in a couple of months. She's not using any medications currently. She denies substance abuse, currently. She denies recent illnesses, including fever, chills, nausea, vomiting, weakness, dizziness, change in bowel or urinary habits. She is not suicidal or homicidal. She states that she is homeless. She is spending time with the father of her baby. She would like to talk to a therapist and possibly see a psychiatrist for medicine treatment today.There are no other known modifying factors.   The history is provided by the patient.    Past Medical History  Diagnosis Date  . Substance abuse   . Panic attack   . ADD (attention deficit disorder)   . Depression    Past Surgical History  Procedure Laterality Date  . Ankle surgery    . Chest tube insertion    . Tubal ligation     Family History  Problem Relation Age of Onset  . Hypertension Father   . Diabetes Other    History  Substance Use Topics  . Smoking status: Current Every Day Smoker -- 1.00 packs/day    Types: Cigarettes  . Smokeless tobacco: Not on file  . Alcohol Use: No     Comment: former   OB History   Grav Para Term Preterm Abortions TAB SAB Ect Mult Living                 Review of Systems  All other systems reviewed and are negative.    Allergies  Cymbalta  Home  Medications   Current Outpatient Rx  Name  Route  Sig  Dispense  Refill  . acetaminophen (TYLENOL) 500 MG tablet   Oral   Take 500 mg by mouth every 6 (six) hours as needed for pain.         . Aspirin-Salicylamide-Caffeine (BC HEADACHE POWDER PO)   Oral   Take 1 Package by mouth every 6 (six) hours as needed (headache).         . clonazePAM (KLONOPIN) 0.5 MG tablet   Oral   Take 0.5 mg by mouth 2 (two) times daily as needed for anxiety.          BP 139/107  Pulse 113  Temp(Src) 98.4 F (36.9 C) (Oral)  Resp 20  SpO2 98% Physical Exam  Nursing note and vitals reviewed. Constitutional: She is oriented to person, place, and time. She appears well-developed and well-nourished.  HENT:  Head: Normocephalic and atraumatic.  Eyes: Conjunctivae and EOM are normal. Pupils are equal, round, and reactive to light.  Neck: Normal range of motion and phonation normal. Neck supple.  Cardiovascular: Normal rate, regular rhythm and intact distal pulses.   Pulmonary/Chest: Effort normal and breath sounds normal. She exhibits no tenderness.  Abdominal: Soft. She exhibits no distension. There is no tenderness. There is no guarding.  Musculoskeletal: Normal range of motion.  Neurological: She is alert  and oriented to person, place, and time. She has normal strength. She exhibits normal muscle tone.  Skin: Skin is warm and dry.  Psychiatric: Her behavior is normal. Judgment and thought content normal.  She is anxious    ED Course  Procedures (including critical care time)  Consult Psychiatry- 22:40  She was seen by psychiatry, who elected to write her a prescription for Klonopin. She will follow up with her regular psychiatry services  Labs Review Labs Reviewed  CBC - Abnormal; Notable for the following:    WBC 14.7 (*)    All other components within normal limits  COMPREHENSIVE METABOLIC PANEL - Abnormal; Notable for the following:    Albumin 3.4 (*)    ALT 49 (*)    Total  Bilirubin 0.1 (*)    All other components within normal limits  ETHANOL  URINE RAPID DRUG SCREEN (HOSP PERFORMED)  POCT PREGNANCY, URINE   Imaging Review No results found.  MDM   1. Anxiety    Anxiety with significant psychosocial stressors. No overt homicidal or suicidal ideation. She does not appear to need inpatient psychiatric treatment.    Flint Melter, MD 10/06/12 2320  Flint Melter, MD 10/07/12 [phone]

## 2012-10-06 NOTE — ED Notes (Addendum)
Pt states "feels like I am missing something" has lost home, children have been taken away and placed in separate homes. Pt has Child psychotherapist, not specific as to why she is here tonight, "wants to fix the problem." Denies SI/HI

## 2012-10-07 NOTE — ED Notes (Signed)
Upon entering to discharge pt, no one was in the room.

## 2012-10-09 ENCOUNTER — Emergency Department (HOSPITAL_COMMUNITY)
Admission: EM | Admit: 2012-10-09 | Discharge: 2012-10-10 | Disposition: A | Discharge status: Home / Self Care | Payer: MEDICAID | Attending: Emergency Medicine | Admitting: Emergency Medicine

## 2012-10-09 ENCOUNTER — Emergency Department (HOSPITAL_COMMUNITY): Payer: Medicaid Other

## 2012-10-09 ENCOUNTER — Encounter (HOSPITAL_COMMUNITY): Payer: Self-pay | Admitting: *Deleted

## 2012-10-09 ENCOUNTER — Emergency Department (HOSPITAL_COMMUNITY)
Admission: EM | Admit: 2012-10-09 | Discharge: 2012-10-09 | Disposition: A | Discharge status: Home / Self Care | Payer: Medicaid Other | Attending: Emergency Medicine | Admitting: Emergency Medicine

## 2012-10-09 DIAGNOSIS — F410 Panic disorder [episodic paroxysmal anxiety] without agoraphobia: Secondary | ICD-10-CM | POA: Insufficient documentation

## 2012-10-09 DIAGNOSIS — F17200 Nicotine dependence, unspecified, uncomplicated: Secondary | ICD-10-CM | POA: Insufficient documentation

## 2012-10-09 DIAGNOSIS — Z8619 Personal history of other infectious and parasitic diseases: Secondary | ICD-10-CM | POA: Insufficient documentation

## 2012-10-09 DIAGNOSIS — T148XXA Other injury of unspecified body region, initial encounter: Secondary | ICD-10-CM

## 2012-10-09 DIAGNOSIS — IMO0002 Reserved for concepts with insufficient information to code with codable children: Secondary | ICD-10-CM | POA: Insufficient documentation

## 2012-10-09 DIAGNOSIS — Z3202 Encounter for pregnancy test, result negative: Secondary | ICD-10-CM | POA: Insufficient documentation

## 2012-10-09 DIAGNOSIS — F1994 Other psychoactive substance use, unspecified with psychoactive substance-induced mood disorder: Secondary | ICD-10-CM

## 2012-10-09 DIAGNOSIS — Y9389 Activity, other specified: Secondary | ICD-10-CM | POA: Insufficient documentation

## 2012-10-09 DIAGNOSIS — Z87898 Personal history of other specified conditions: Secondary | ICD-10-CM

## 2012-10-09 DIAGNOSIS — F411 Generalized anxiety disorder: Secondary | ICD-10-CM | POA: Insufficient documentation

## 2012-10-09 DIAGNOSIS — F1510 Other stimulant abuse, uncomplicated: Secondary | ICD-10-CM | POA: Insufficient documentation

## 2012-10-09 DIAGNOSIS — Y92410 Unspecified street and highway as the place of occurrence of the external cause: Secondary | ICD-10-CM | POA: Insufficient documentation

## 2012-10-09 DIAGNOSIS — Z79899 Other long term (current) drug therapy: Secondary | ICD-10-CM | POA: Insufficient documentation

## 2012-10-09 DIAGNOSIS — F1910 Other psychoactive substance abuse, uncomplicated: Secondary | ICD-10-CM

## 2012-10-09 HISTORY — DX: Unspecified viral hepatitis C without hepatic coma: B19.20

## 2012-10-09 LAB — COMPREHENSIVE METABOLIC PANEL
ALT: 48 U/L — ABNORMAL HIGH (ref 0–35)
AST: 26 U/L (ref 0–37)
AST: 26 U/L (ref 0–37)
Albumin: 3.6 g/dL (ref 3.5–5.2)
Alkaline Phosphatase: 68 U/L (ref 39–117)
BUN: 20 mg/dL (ref 6–23)
Calcium: 9.2 mg/dL (ref 8.4–10.5)
Creatinine, Ser: 0.65 mg/dL (ref 0.50–1.10)
GFR calc Af Amer: >90 mL/min (ref >90)
Glucose, Bld: 101 mg/dL — ABNORMAL HIGH (ref 70–99)
Potassium: 3.8 mEq/L (ref 3.5–5.1)
Sodium: 140 mEq/L (ref 135–145)
Total Bilirubin: 0.2 mg/dL — ABNORMAL LOW (ref 0.3–1.2)
Total Protein: 7.3 g/dL (ref 6.0–8.3)

## 2012-10-09 LAB — CBC
HCT: 40.9 % (ref 36.0–46.0)
MCH: 29.2 pg (ref 26.0–34.0)
MCHC: 33.3 g/dL (ref 30.0–36.0)
MCV: 88 fL (ref 78.0–100.0)
Platelets: 338 10*3/uL (ref 150–400)
RDW: 12.9 % (ref 11.5–15.5)

## 2012-10-09 LAB — RAPID URINE DRUG SCREEN, HOSP PERFORMED
Amphetamines: POSITIVE — AB
Benzodiazepines: POSITIVE — AB
Cocaine: NOT DETECTED
Opiates: NOT DETECTED
Tetrahydrocannabinol: POSITIVE — AB

## 2012-10-09 LAB — CBC WITH DIFFERENTIAL/PLATELET
Basophils Absolute: 0.1 10*3/uL (ref 0.0–0.1)
Eosinophils Absolute: 0.4 10*3/uL (ref 0.0–0.7)
Lymphocytes Relative: 23 % (ref 12–46)
Lymphs Abs: 3 10*3/uL (ref 0.7–4.0)
Neutrophils Relative %: 69 % (ref 43–77)
Platelets: 363 10*3/uL (ref 150–400)
RBC: 4.82 MIL/uL (ref 3.87–5.11)
WBC: 13.3 10*3/uL — ABNORMAL HIGH (ref 4.0–10.5)

## 2012-10-09 LAB — URINALYSIS, ROUTINE W REFLEX MICROSCOPIC
Bilirubin Urine: NEGATIVE
Glucose, UA: NEGATIVE mg/dL
Hgb urine dipstick: NEGATIVE
Specific Gravity, Urine: 1.024 (ref 1.005–1.030)
Urobilinogen, UA: 0.2 mg/dL (ref 0.0–1.0)
pH: 6 (ref 5.0–8.0)

## 2012-10-09 LAB — LACTIC ACID, PLASMA: Lactic Acid, Venous: 0.9 mmol/L (ref 0.5–2.2)

## 2012-10-09 LAB — ETHANOL: Alcohol, Ethyl (B): <11 mg/dL (ref 0–11)

## 2012-10-09 LAB — SALICYLATE LEVEL: Salicylate Lvl: <2 mg/dL — ABNORMAL LOW (ref 2.8–20.0)

## 2012-10-09 MED ORDER — IBUPROFEN 400 MG PO TABS
600.0000 mg | ORAL_TABLET | Freq: Once | ORAL | Status: AC
  Administered 2012-10-09: 05:00:00 600 mg via ORAL
  Filled 2012-10-09 (×2): qty 1

## 2012-10-09 NOTE — ED Notes (Signed)
Blanket given to patient.
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
## 2012-10-09 NOTE — ED Notes (Signed)
Pt on menstrual and in and out cath done per protocol and urine yellow

## 2012-10-09 NOTE — ED Notes (Signed)
Pt is back from CT and informed we need to get urine sample.  Pt resting on side and c-collar on patient.  Moves all extremities and easily aroused.

## 2012-10-09 NOTE — ED Notes (Signed)
Per MD pt does not need a sitter.

## 2012-10-09 NOTE — ED Notes (Signed)
Arrives via EMS, voluntarily, wants detox from methamphetamine.

## 2012-10-09 NOTE — ED Notes (Signed)
Pt is very sleepy, states she wants help getting off of Meth, states she has not done it much, Pt states she is unable to void. Pt drinking a soda. Sitter at the bedside.  Pt denies SI/ HI
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
## 2012-10-09 NOTE — ED Notes (Signed)
Second telepsych in process.
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
## 2012-10-09 NOTE — ED Notes (Signed)
Pt eating tomato soup, crackers, and drinking soda, gave pt wet wash cloth to wash face.

## 2012-10-10 LAB — RAPID URINE DRUG SCREEN, HOSP PERFORMED
Amphetamines: POSITIVE — AB
Benzodiazepines: NOT DETECTED
Opiates: NOT DETECTED

## 2012-10-10 LAB — POCT PREGNANCY, URINE: Preg Test, Ur: NEGATIVE

## 2012-10-10 MED ORDER — CLONAZEPAM 0.5 MG PO TABS: 0.5000 mg | ORAL_TABLET | Freq: Two times a day (BID) | ORAL | Status: DC | PRN

## 2012-10-10 NOTE — ED Notes (Signed)
Pt crying, ready to leave, wants to smoke, Discharge paper being printed. Pt calling for a ride. Pt has no shoes, gave pt socks.

## 2012-10-10 NOTE — ED Provider Notes (Signed)
Pt stable for d/c home She has been seen by behavioral health Short course of klonopin ordered  Joya Gaskins, MD 10/10/12 [phone]
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
## 2012-10-10 NOTE — ED Notes (Signed)
Patient ambulatory to restroom with steady gait, clean catch instructions given and advised pt to bring specimen back to room as well.  

## 2012-12-22 ENCOUNTER — Emergency Department (HOSPITAL_BASED_OUTPATIENT_CLINIC_OR_DEPARTMENT_OTHER)
Admission: EM | Admit: 2012-12-22 | Discharge: 2012-12-22 | Disposition: A | Discharge status: Home / Self Care | Payer: Medicaid Other | Attending: Emergency Medicine | Admitting: Emergency Medicine

## 2012-12-22 ENCOUNTER — Encounter (HOSPITAL_BASED_OUTPATIENT_CLINIC_OR_DEPARTMENT_OTHER): Payer: Self-pay | Admitting: Emergency Medicine

## 2012-12-22 DIAGNOSIS — F410 Panic disorder [episodic paroxysmal anxiety] without agoraphobia: Secondary | ICD-10-CM | POA: Insufficient documentation

## 2012-12-22 DIAGNOSIS — Z8619 Personal history of other infectious and parasitic diseases: Secondary | ICD-10-CM | POA: Insufficient documentation

## 2012-12-22 DIAGNOSIS — R21 Rash and other nonspecific skin eruption: Secondary | ICD-10-CM

## 2012-12-22 DIAGNOSIS — Z87828 Personal history of other (healed) physical injury and trauma: Secondary | ICD-10-CM | POA: Insufficient documentation

## 2012-12-22 DIAGNOSIS — B86 Scabies: Secondary | ICD-10-CM

## 2012-12-22 DIAGNOSIS — F329 Major depressive disorder, single episode, unspecified: Secondary | ICD-10-CM | POA: Insufficient documentation

## 2012-12-22 DIAGNOSIS — F3289 Other specified depressive episodes: Secondary | ICD-10-CM | POA: Insufficient documentation

## 2012-12-22 DIAGNOSIS — F17200 Nicotine dependence, unspecified, uncomplicated: Secondary | ICD-10-CM | POA: Insufficient documentation

## 2012-12-22 MED ORDER — PERMETHRIN 5 % EX CREA: TOPICAL_CREAM | CUTANEOUS | Status: DC

## 2012-12-22 MED ORDER — TRIAMCINOLONE ACETONIDE 0.025 % EX CREA: 1.0000 "application " | TOPICAL_CREAM | Freq: Two times a day (BID) | CUTANEOUS | Status: DC

## 2012-12-22 NOTE — ED Notes (Signed)
Pt has itchy rash to hands, legs and face for a few days.  Itching worse at night.

## 2012-12-22 NOTE — ED Notes (Signed)
Pt angry, ignoring discharge instructions.  Pt left without signing signature pad.

## 2012-12-22 NOTE — ED Provider Notes (Signed)
Medical screening examination/treatment/procedure(s) were performed by non-physician practitioner and as supervising physician I was immediately available for consultation/collaboration.  EKG Interpretation   None         Junius Argyle, MD 12/22/12 1549
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
## 2013-01-15 ENCOUNTER — Encounter (HOSPITAL_BASED_OUTPATIENT_CLINIC_OR_DEPARTMENT_OTHER): Payer: Self-pay | Admitting: Emergency Medicine

## 2013-01-15 ENCOUNTER — Emergency Department (HOSPITAL_BASED_OUTPATIENT_CLINIC_OR_DEPARTMENT_OTHER)
Admission: EM | Admit: 2013-01-15 | Discharge: 2013-01-15 | Disposition: A | Discharge status: Home / Self Care | Payer: Medicaid Other | Attending: Emergency Medicine | Admitting: Emergency Medicine

## 2013-01-15 DIAGNOSIS — F411 Generalized anxiety disorder: Secondary | ICD-10-CM | POA: Insufficient documentation

## 2013-01-15 DIAGNOSIS — Z8619 Personal history of other infectious and parasitic diseases: Secondary | ICD-10-CM | POA: Insufficient documentation

## 2013-01-15 DIAGNOSIS — F17200 Nicotine dependence, unspecified, uncomplicated: Secondary | ICD-10-CM | POA: Insufficient documentation

## 2013-01-15 DIAGNOSIS — M549 Dorsalgia, unspecified: Secondary | ICD-10-CM

## 2013-01-15 DIAGNOSIS — F988 Other specified behavioral and emotional disorders with onset usually occurring in childhood and adolescence: Secondary | ICD-10-CM | POA: Insufficient documentation

## 2013-01-15 DIAGNOSIS — Z79899 Other long term (current) drug therapy: Secondary | ICD-10-CM | POA: Insufficient documentation

## 2013-01-15 DIAGNOSIS — IMO0002 Reserved for concepts with insufficient information to code with codable children: Secondary | ICD-10-CM | POA: Insufficient documentation

## 2013-01-15 DIAGNOSIS — Z791 Long term (current) use of non-steroidal anti-inflammatories (NSAID): Secondary | ICD-10-CM | POA: Insufficient documentation

## 2013-01-15 MED ORDER — DIAZEPAM 5 MG PO TABS: 5.0000 mg | ORAL_TABLET | Freq: Two times a day (BID) | ORAL | Status: AC

## 2013-01-15 MED ORDER — IBUPROFEN 800 MG PO TABS: 800.0000 mg | ORAL_TABLET | Freq: Three times a day (TID) | ORAL | Status: AC

## 2013-01-15 MED ORDER — HYDROCODONE-ACETAMINOPHEN 5-325 MG PO TABS: 1.0000 | ORAL_TABLET | Freq: Four times a day (QID) | ORAL | Status: DC | PRN
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
## 2013-02-06 ENCOUNTER — Emergency Department (HOSPITAL_COMMUNITY)
Admission: EM | Admit: 2013-02-06 | Discharge: 2013-02-06 | Disposition: A | Discharge status: Home / Self Care | Payer: Medicaid Other | Attending: Emergency Medicine | Admitting: Emergency Medicine

## 2013-02-06 ENCOUNTER — Emergency Department (HOSPITAL_COMMUNITY): Payer: Medicaid Other

## 2013-02-06 ENCOUNTER — Encounter (HOSPITAL_COMMUNITY): Payer: Self-pay | Admitting: Emergency Medicine

## 2013-02-06 DIAGNOSIS — F17200 Nicotine dependence, unspecified, uncomplicated: Secondary | ICD-10-CM | POA: Insufficient documentation

## 2013-02-06 DIAGNOSIS — F410 Panic disorder [episodic paroxysmal anxiety] without agoraphobia: Secondary | ICD-10-CM | POA: Insufficient documentation

## 2013-02-06 DIAGNOSIS — Z8619 Personal history of other infectious and parasitic diseases: Secondary | ICD-10-CM | POA: Insufficient documentation

## 2013-02-06 DIAGNOSIS — S233XXA Sprain of ligaments of thoracic spine, initial encounter: Secondary | ICD-10-CM

## 2013-02-06 DIAGNOSIS — Y939 Activity, unspecified: Secondary | ICD-10-CM | POA: Insufficient documentation

## 2013-02-06 DIAGNOSIS — F3289 Other specified depressive episodes: Secondary | ICD-10-CM | POA: Insufficient documentation

## 2013-02-06 DIAGNOSIS — S29012A Strain of muscle and tendon of back wall of thorax, initial encounter: Secondary | ICD-10-CM

## 2013-02-06 DIAGNOSIS — F988 Other specified behavioral and emotional disorders with onset usually occurring in childhood and adolescence: Secondary | ICD-10-CM | POA: Insufficient documentation

## 2013-02-06 DIAGNOSIS — Y92410 Unspecified street and highway as the place of occurrence of the external cause: Secondary | ICD-10-CM | POA: Insufficient documentation

## 2013-02-06 DIAGNOSIS — Z87828 Personal history of other (healed) physical injury and trauma: Secondary | ICD-10-CM | POA: Insufficient documentation

## 2013-02-06 DIAGNOSIS — F329 Major depressive disorder, single episode, unspecified: Secondary | ICD-10-CM | POA: Insufficient documentation

## 2013-02-06 DIAGNOSIS — S239XXA Sprain of unspecified parts of thorax, initial encounter: Secondary | ICD-10-CM | POA: Insufficient documentation

## 2013-02-06 DIAGNOSIS — Z79899 Other long term (current) drug therapy: Secondary | ICD-10-CM | POA: Insufficient documentation

## 2013-02-06 DIAGNOSIS — IMO0002 Reserved for concepts with insufficient information to code with codable children: Secondary | ICD-10-CM | POA: Insufficient documentation

## 2013-02-06 MED ORDER — PREDNISONE 50 MG PO TABS: 50.0000 mg | ORAL_TABLET | Freq: Every day | ORAL | Status: DC

## 2013-02-06 MED ORDER — OXYCODONE-ACETAMINOPHEN 5-325 MG PO TABS: 1.0000 | ORAL_TABLET | Freq: Four times a day (QID) | ORAL | Status: DC | PRN
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
## 2013-02-06 NOTE — Discharge Instructions (Signed)
Return here as needed,follow up with the doctor provided. Ice and heat to your back. Your x-rays were normal
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
## 2013-02-10 NOTE — ED Provider Notes (Signed)
Medical screening examination/treatment/procedure(s) were performed by non-physician practitioner and as supervising physician I was immediately available for consultation/collaboration.  EKG Interpretation   None         Yanice Maqueda W. Joyce Leckey, MD 02/10/13 1935 

## 2013-07-29 ENCOUNTER — Encounter (HOSPITAL_COMMUNITY): Payer: Self-pay | Admitting: *Deleted

## 2013-07-29 ENCOUNTER — Ambulatory Visit (HOSPITAL_COMMUNITY)
Admission: RE | Admit: 2013-07-29 | Discharge: 2013-07-29 | Discharge status: Against Medical Advice | Payer: 59 | Attending: Psychiatry | Admitting: Psychiatry

## 2013-07-29 DIAGNOSIS — B009 Herpesviral infection, unspecified: Secondary | ICD-10-CM

## 2013-07-29 DIAGNOSIS — F410 Panic disorder [episodic paroxysmal anxiety] without agoraphobia: Secondary | ICD-10-CM | POA: Insufficient documentation

## 2013-07-29 DIAGNOSIS — F1910 Other psychoactive substance abuse, uncomplicated: Secondary | ICD-10-CM | POA: Insufficient documentation

## 2013-07-29 DIAGNOSIS — F39 Unspecified mood [affective] disorder: Secondary | ICD-10-CM | POA: Insufficient documentation

## 2013-07-29 DIAGNOSIS — F988 Other specified behavioral and emotional disorders with onset usually occurring in childhood and adolescence: Secondary | ICD-10-CM | POA: Insufficient documentation

## 2013-07-29 DIAGNOSIS — B1920 Unspecified viral hepatitis C without hepatic coma: Secondary | ICD-10-CM | POA: Insufficient documentation

## 2013-07-29 HISTORY — DX: Herpesviral infection, unspecified: B00.9
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
## 2013-08-16 ENCOUNTER — Emergency Department (HOSPITAL_COMMUNITY)
Admission: EM | Admit: 2013-08-16 | Discharge: 2013-08-16 | Disposition: A | Discharge status: Home / Self Care | Payer: No Typology Code available for payment source

## 2013-08-16 ENCOUNTER — Ambulatory Visit (HOSPITAL_COMMUNITY)
Admission: AD | Admit: 2013-08-16 | Discharge: 2013-08-16 | Disposition: A | Discharge status: Home / Self Care | Payer: 59 | Attending: Psychiatry | Admitting: Psychiatry

## 2013-08-16 DIAGNOSIS — F329 Major depressive disorder, single episode, unspecified: Secondary | ICD-10-CM | POA: Insufficient documentation

## 2013-08-16 DIAGNOSIS — F3289 Other specified depressive episodes: Secondary | ICD-10-CM | POA: Insufficient documentation

## 2013-08-16 DIAGNOSIS — B1920 Unspecified viral hepatitis C without hepatic coma: Secondary | ICD-10-CM | POA: Insufficient documentation

## 2013-08-16 DIAGNOSIS — F1994 Other psychoactive substance use, unspecified with psychoactive substance-induced mood disorder: Secondary | ICD-10-CM | POA: Insufficient documentation

## 2013-08-16 DIAGNOSIS — F17200 Nicotine dependence, unspecified, uncomplicated: Secondary | ICD-10-CM | POA: Insufficient documentation

## 2013-08-16 DIAGNOSIS — F988 Other specified behavioral and emotional disorders with onset usually occurring in childhood and adolescence: Secondary | ICD-10-CM | POA: Insufficient documentation
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
## 2013-10-11 ENCOUNTER — Emergency Department (HOSPITAL_COMMUNITY)
Admission: EM | Admit: 2013-10-11 | Discharge: 2013-10-13 | Disposition: A | Discharge status: Other Acute Inpatient Hospital | Payer: 59 | Attending: Emergency Medicine | Admitting: Emergency Medicine

## 2013-10-11 ENCOUNTER — Encounter (HOSPITAL_COMMUNITY): Payer: Self-pay | Admitting: Emergency Medicine

## 2013-10-11 ENCOUNTER — Ambulatory Visit (HOSPITAL_COMMUNITY)
Admission: AD | Admit: 2013-10-11 | Discharge: 2013-10-11 | Disposition: A | Discharge status: Home / Self Care | Payer: 59 | Attending: Psychiatry | Admitting: Psychiatry

## 2013-10-11 DIAGNOSIS — Z87828 Personal history of other (healed) physical injury and trauma: Secondary | ICD-10-CM | POA: Insufficient documentation

## 2013-10-11 DIAGNOSIS — R440 Auditory hallucinations: Secondary | ICD-10-CM

## 2013-10-11 DIAGNOSIS — F1220 Cannabis dependence, uncomplicated: Secondary | ICD-10-CM | POA: Insufficient documentation

## 2013-10-11 DIAGNOSIS — IMO0001 Reserved for inherently not codable concepts without codable children: Secondary | ICD-10-CM | POA: Insufficient documentation

## 2013-10-11 DIAGNOSIS — F319 Bipolar disorder, unspecified: Secondary | ICD-10-CM | POA: Insufficient documentation

## 2013-10-11 DIAGNOSIS — Z7289 Other problems related to lifestyle: Secondary | ICD-10-CM | POA: Insufficient documentation

## 2013-10-11 DIAGNOSIS — F411 Generalized anxiety disorder: Secondary | ICD-10-CM | POA: Insufficient documentation

## 2013-10-11 DIAGNOSIS — Z8619 Personal history of other infectious and parasitic diseases: Secondary | ICD-10-CM | POA: Insufficient documentation

## 2013-10-11 DIAGNOSIS — B1920 Unspecified viral hepatitis C without hepatic coma: Secondary | ICD-10-CM | POA: Insufficient documentation

## 2013-10-11 DIAGNOSIS — B009 Herpesviral infection, unspecified: Secondary | ICD-10-CM | POA: Insufficient documentation

## 2013-10-11 DIAGNOSIS — F3113 Bipolar disorder, current episode manic without psychotic features, severe: Secondary | ICD-10-CM | POA: Diagnosis present

## 2013-10-11 DIAGNOSIS — Z658 Other specified problems related to psychosocial circumstances: Secondary | ICD-10-CM | POA: Insufficient documentation

## 2013-10-11 DIAGNOSIS — IMO0002 Reserved for concepts with insufficient information to code with codable children: Secondary | ICD-10-CM | POA: Insufficient documentation

## 2013-10-11 DIAGNOSIS — R443 Hallucinations, unspecified: Secondary | ICD-10-CM | POA: Insufficient documentation

## 2013-10-11 DIAGNOSIS — Z5987 Material hardship due to limited financial resources, not elsewhere classified: Secondary | ICD-10-CM | POA: Insufficient documentation

## 2013-10-11 DIAGNOSIS — F312 Bipolar disorder, current episode manic severe with psychotic features: Secondary | ICD-10-CM | POA: Diagnosis present

## 2013-10-11 DIAGNOSIS — F315 Bipolar disorder, current episode depressed, severe, with psychotic features: Secondary | ICD-10-CM | POA: Diagnosis present

## 2013-10-11 DIAGNOSIS — Z598 Other problems related to housing and economic circumstances: Secondary | ICD-10-CM | POA: Insufficient documentation

## 2013-10-11 DIAGNOSIS — Z559 Problems related to education and literacy, unspecified: Secondary | ICD-10-CM | POA: Insufficient documentation

## 2013-10-11 DIAGNOSIS — Z008 Encounter for other general examination: Secondary | ICD-10-CM | POA: Insufficient documentation

## 2013-10-11 DIAGNOSIS — F17200 Nicotine dependence, unspecified, uncomplicated: Secondary | ICD-10-CM | POA: Insufficient documentation

## 2013-10-11 DIAGNOSIS — F988 Other specified behavioral and emotional disorders with onset usually occurring in childhood and adolescence: Secondary | ICD-10-CM | POA: Insufficient documentation

## 2013-10-11 DIAGNOSIS — Z79899 Other long term (current) drug therapy: Secondary | ICD-10-CM | POA: Insufficient documentation

## 2013-10-11 LAB — RAPID URINE DRUG SCREEN, HOSP PERFORMED
AMPHETAMINES: NOT DETECTED
Barbiturates: NOT DETECTED
Benzodiazepines: NOT DETECTED
COCAINE: NOT DETECTED
OPIATES: NOT DETECTED
Tetrahydrocannabinol: NOT DETECTED

## 2013-10-11 LAB — CBC
HCT: 39.7 % (ref 36.0–46.0)
Hemoglobin: 13.4 g/dL (ref 12.0–15.0)
MCH: 30.6 pg (ref 26.0–34.0)
MCHC: 33.8 g/dL (ref 30.0–36.0)
MCV: 90.6 fL (ref 78.0–100.0)
PLATELETS: 316 10*3/uL (ref 150–400)
RBC: 4.38 MIL/uL (ref 3.87–5.11)
RDW: 12.2 % (ref 11.5–15.5)
WBC: 14.1 10*3/uL — ABNORMAL HIGH (ref 4.0–10.5)

## 2013-10-11 LAB — COMPREHENSIVE METABOLIC PANEL
ALT: 57 U/L — AB (ref 0–35)
AST: 36 U/L (ref 0–37)
Albumin: 3.8 g/dL (ref 3.5–5.2)
Alkaline Phosphatase: 79 U/L (ref 39–117)
Anion gap: 12 (ref 5–15)
BUN: 15 mg/dL (ref 6–23)
CALCIUM: 9.2 mg/dL (ref 8.4–10.5)
CO2: 26 mEq/L (ref 19–32)
Chloride: 100 mEq/L (ref 96–112)
Creatinine, Ser: 0.58 mg/dL (ref 0.50–1.10)
GFR calc Af Amer: >90 mL/min (ref >90)
GFR calc non Af Amer: >90 mL/min (ref >90)
Glucose, Bld: 97 mg/dL (ref 70–99)
Potassium: 4.4 mEq/L (ref 3.7–5.3)
SODIUM: 138 meq/L (ref 137–147)
TOTAL PROTEIN: 7.7 g/dL (ref 6.0–8.3)
Total Bilirubin: <0.2 mg/dL — ABNORMAL LOW (ref 0.3–1.2)

## 2013-10-11 LAB — ETHANOL: Alcohol, Ethyl (B): <11 mg/dL (ref 0–11)

## 2013-10-11 MED ORDER — QUETIAPINE FUMARATE ER 200 MG PO TB24
200.0000 mg | ORAL_TABLET | Freq: Every day | ORAL | Status: DC
  Administered 2013-10-12: 200 mg via ORAL
  Filled 2013-10-11 (×2): qty 1

## 2013-10-11 MED ORDER — QUETIAPINE FUMARATE 50 MG PO TABS: 50.0000 mg | ORAL_TABLET | Freq: Once | ORAL | Status: DC

## 2013-10-11 MED ORDER — HYDROXYZINE HCL 25 MG PO TABS: 25.0000 mg | ORAL_TABLET | ORAL | Status: DC | PRN

## 2013-10-11 MED ORDER — QUETIAPINE FUMARATE 50 MG PO TABS
50.0000 mg | ORAL_TABLET | Freq: Once | ORAL | Status: AC
  Administered 2013-10-12: 50 mg via ORAL
  Filled 2013-10-11: qty 1

## 2013-10-11 NOTE — BH Assessment (Addendum)
Relayed results of assessment to Maryjean Morn, PA. Per Leonette Most, [place] he would like pt to be transferred to [facility] for medical clearance and collaboration on final disposition recommendations.   Informed Tim, tech about need for [employer].   Called [facility] ED Charge RN to discuss plan with Ardine Eng.  Discussed plan with RN.  Discussed plan with pt. She is agreeable. Father has left and asks if pt is discharged for her to call Mardelle Matte for a ride. Pt has been informed of this.   Clista Bernhardt, [facility] Triage Specialist 10/11/2013 8:31 PM
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
## 2013-10-11 NOTE — ED Notes (Signed)
Pt states she went to [facility] and was sent here for medical clearance  Pt states she went there because she has had a hard few years and she is having a hard time dealing with things  Pt denies SI/HI at this time
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
## 2013-10-11 NOTE — ED Notes (Deleted)
Bed: WTR6 Expected date:  Expected time:  Means of arrival:  Comments: 

## 2013-10-11 NOTE — ED Notes (Signed)
Pt does not have a bed at [facility]

## 2013-10-12 ENCOUNTER — Encounter (HOSPITAL_COMMUNITY): Payer: Self-pay | Admitting: Registered Nurse

## 2013-10-12 DIAGNOSIS — R440 Auditory hallucinations: Secondary | ICD-10-CM | POA: Diagnosis present

## 2013-10-12 DIAGNOSIS — F29 Unspecified psychosis not due to a substance or known physiological condition: Secondary | ICD-10-CM

## 2013-10-12 LAB — URINALYSIS, ROUTINE W REFLEX MICROSCOPIC
Bilirubin Urine: NEGATIVE
Glucose, UA: NEGATIVE mg/dL
Ketones, ur: NEGATIVE mg/dL
Leukocytes, UA: NEGATIVE
Nitrite: NEGATIVE
PROTEIN: NEGATIVE mg/dL
SPECIFIC GRAVITY, URINE: 1.022 (ref 1.005–1.030)
UROBILINOGEN UA: 0.2 mg/dL (ref 0.0–1.0)
pH: 6 (ref 5.0–8.0)

## 2013-10-12 LAB — URINE MICROSCOPIC-ADD ON

## 2013-10-12 MED ORDER — BENZTROPINE MESYLATE 1 MG PO TABS
1.0000 mg | ORAL_TABLET | Freq: Two times a day (BID) | ORAL | Status: DC
  Administered 2013-10-12 – 2013-10-13 (×3): 1 mg via ORAL
  Filled 2013-10-12 (×3): qty 1

## 2013-10-12 MED ORDER — HALOPERIDOL 5 MG PO TABS
5.0000 mg | ORAL_TABLET | Freq: Two times a day (BID) | ORAL | Status: DC
  Administered 2013-10-12 – 2013-10-13 (×3): 5 mg via ORAL
  Filled 2013-10-12 (×3): qty 1

## 2013-10-12 NOTE — ED Notes (Signed)
Pt presents with complaint of depression & anxiety, pt reports she is having a hard time dealing with the loss of her children, ages 72 & 4.  Children stay with their grandparents.  Denies SI or HI, hearing voice by the name of [place], pt reports.  Diagnosed with Bipolar DO in the past, feeling hopeless.  Pt cooperative but anxious.

## 2013-10-12 NOTE — ED Provider Notes (Signed)
Medical screening examination/treatment/procedure(s) were performed by non-physician practitioner and as supervising physician I was immediately available for consultation/collaboration.   EKG Interpretation None       Martha K Linker, MD 10/12/13 2322 
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
## 2013-10-12 NOTE — ED Notes (Signed)
Pt sitting up eating dinner at present, denies SI or HI, will continue to monitor for safety.

## 2013-10-12 NOTE — Progress Notes (Signed)
  Pt's referral has been faxed to the following facilities: [facility]- per [facility] reviewing referrals [facility]- per Darreld Mclean can fax [facility]- per [place] can fax [facility]- per [place] can fax  The following facilities contacted are at capacity:  [facility]- per Fayrene Helper- per [facility]- per Lynnell Catalan- per Vania Rea- per [facility]- per [facility]- per Lonell Grandchild  [facility]- per Elmore Guise- per [facility]- per [facility]- per [facility]- per Dionicio Stall- per Darl Pikes  [facility]- per [facility]  [facility]- per Lenor Coffin [facility] Disposition MHT

## 2013-10-13 ENCOUNTER — Inpatient Hospital Stay (HOSPITAL_COMMUNITY)
Admission: AD | Admit: 2013-10-13 | Discharge: 2013-10-19 | DRG: 885 | Disposition: A | Discharge status: Home / Self Care | Payer: Federal, State, Local not specified - Other | Source: Intra-hospital | Attending: Psychiatry | Admitting: Psychiatry

## 2013-10-13 ENCOUNTER — Encounter (HOSPITAL_COMMUNITY): Payer: Self-pay

## 2013-10-13 DIAGNOSIS — Z8249 Family history of ischemic heart disease and other diseases of the circulatory system: Secondary | ICD-10-CM | POA: Diagnosis not present

## 2013-10-13 DIAGNOSIS — F909 Attention-deficit hyperactivity disorder, unspecified type: Secondary | ICD-10-CM | POA: Diagnosis present

## 2013-10-13 DIAGNOSIS — F411 Generalized anxiety disorder: Secondary | ICD-10-CM | POA: Diagnosis present

## 2013-10-13 DIAGNOSIS — F1210 Cannabis abuse, uncomplicated: Secondary | ICD-10-CM | POA: Diagnosis present

## 2013-10-13 DIAGNOSIS — G478 Other sleep disorders: Secondary | ICD-10-CM | POA: Diagnosis present

## 2013-10-13 DIAGNOSIS — IMO0002 Reserved for concepts with insufficient information to code with codable children: Secondary | ICD-10-CM

## 2013-10-13 DIAGNOSIS — F319 Bipolar disorder, unspecified: Secondary | ICD-10-CM

## 2013-10-13 DIAGNOSIS — R4589 Other symptoms and signs involving emotional state: Secondary | ICD-10-CM | POA: Diagnosis present

## 2013-10-13 DIAGNOSIS — Z598 Other problems related to housing and economic circumstances: Secondary | ICD-10-CM

## 2013-10-13 DIAGNOSIS — F410 Panic disorder [episodic paroxysmal anxiety] without agoraphobia: Secondary | ICD-10-CM | POA: Diagnosis present

## 2013-10-13 DIAGNOSIS — Z833 Family history of diabetes mellitus: Secondary | ICD-10-CM | POA: Diagnosis not present

## 2013-10-13 DIAGNOSIS — R45851 Suicidal ideations: Secondary | ICD-10-CM | POA: Diagnosis not present

## 2013-10-13 DIAGNOSIS — F1410 Cocaine abuse, uncomplicated: Secondary | ICD-10-CM | POA: Diagnosis present

## 2013-10-13 DIAGNOSIS — F3113 Bipolar disorder, current episode manic without psychotic features, severe: Secondary | ICD-10-CM | POA: Diagnosis present

## 2013-10-13 DIAGNOSIS — F39 Unspecified mood [affective] disorder: Secondary | ICD-10-CM | POA: Diagnosis present

## 2013-10-13 DIAGNOSIS — F312 Bipolar disorder, current episode manic severe with psychotic features: Secondary | ICD-10-CM | POA: Diagnosis present

## 2013-10-13 DIAGNOSIS — F1110 Opioid abuse, uncomplicated: Secondary | ICD-10-CM

## 2013-10-13 DIAGNOSIS — F3160 Bipolar disorder, current episode mixed, unspecified: Secondary | ICD-10-CM | POA: Diagnosis present

## 2013-10-13 DIAGNOSIS — Z609 Problem related to social environment, unspecified: Secondary | ICD-10-CM

## 2013-10-13 DIAGNOSIS — Z5987 Material hardship due to limited financial resources, not elsewhere classified: Secondary | ICD-10-CM

## 2013-10-13 DIAGNOSIS — Z5989 Other problems related to housing and economic circumstances: Secondary | ICD-10-CM | POA: Diagnosis not present

## 2013-10-13 DIAGNOSIS — F315 Bipolar disorder, current episode depressed, severe, with psychotic features: Secondary | ICD-10-CM

## 2013-10-13 DIAGNOSIS — R4689 Other symptoms and signs involving appearance and behavior: Secondary | ICD-10-CM

## 2013-10-13 DIAGNOSIS — F17200 Nicotine dependence, unspecified, uncomplicated: Secondary | ICD-10-CM | POA: Diagnosis present

## 2013-10-13 DIAGNOSIS — F3162 Bipolar disorder, current episode mixed, moderate: Secondary | ICD-10-CM

## 2013-10-13 MED ORDER — ACETAMINOPHEN 325 MG PO TABS
650.0000 mg | ORAL_TABLET | Freq: Four times a day (QID) | ORAL | Status: DC | PRN
  Administered 2013-10-16 – 2013-10-17 (×3): 650 mg via ORAL
  Filled 2013-10-13 (×3): qty 2

## 2013-10-13 MED ORDER — NICOTINE 21 MG/24HR TD PT24
21.0000 mg | MEDICATED_PATCH | Freq: Once | TRANSDERMAL | Status: DC
Start: 2013-10-13 — End: 2013-10-13
  Administered 2013-10-13: 21 mg via TRANSDERMAL
  Filled 2013-10-13: qty 1

## 2013-10-13 MED ORDER — HALOPERIDOL 5 MG PO TABS
5.0000 mg | ORAL_TABLET | Freq: Two times a day (BID) | ORAL | Status: DC
  Administered 2013-10-13 – 2013-10-16 (×6): 5 mg via ORAL
  Filled 2013-10-13 (×10): qty 1

## 2013-10-13 MED ORDER — ALUM & MAG HYDROXIDE-SIMETH 200-200-20 MG/5ML PO SUSP: 30.0000 mL | ORAL | Status: DC | PRN

## 2013-10-13 MED ORDER — HYDROXYZINE HCL 25 MG PO TABS
25.0000 mg | ORAL_TABLET | Freq: Three times a day (TID) | ORAL | Status: DC | PRN
  Administered 2013-10-17: 25 mg via ORAL
  Filled 2013-10-13 (×2): qty 1
  Filled 2013-10-13: qty 42

## 2013-10-13 MED ORDER — BENZTROPINE MESYLATE 1 MG PO TABS
1.0000 mg | ORAL_TABLET | Freq: Two times a day (BID) | ORAL | Status: DC
  Administered 2013-10-13 – 2013-10-19 (×12): 1 mg via ORAL
  Filled 2013-10-13 (×18): qty 1

## 2013-10-13 MED ORDER — MAGNESIUM HYDROXIDE 400 MG/5ML PO SUSP: 30.0000 mL | Freq: Every day | ORAL | Status: DC | PRN
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
## 2013-10-13 NOTE — BH Assessment (Signed)
Per Binnie Rail, [facility], adult unit is currently at capacity. Contacted the following facilities for placement:  CURRENTLY AT CAPACITY: [facility], per [facility], per [facility], per [facility], per [facility], per Glor[facility], per [facility], per [facility], Per [facility], per [facility], per [facility], per [facility], per [facility], per [facility], per Kennieth Rad, per [facility], per Claudine  INFORMATION HAS BEEN SENT TO THESE FACILITIES FOR WAIT LIST: [facility]   [address] [place], [place], [facility] Triage Specialist [phone]

## 2013-10-13 NOTE — Progress Notes (Signed)
  CARE MANAGEMENT ED NOTE 10/13/2013  Patient:  Leslie [name], Leslie [name]   Account Number:  [id number]  Date Initiated:  10/13/2013  Documentation initiated by:  Edd Arbour  Subjective/Objective Assessment:   33 yr old female from [place]   Dx Psychotic  disorder, smoker     Subjective/Objective Assessment Detail:   UDS & etoh within normal  Pt has not been to pcp [facility] "in a while" reports "foreign" providers "change"  Pt does not confirm with CM if she will or will not return to [facility]  Pt very talkative Pt inquired about d/c home referred her to [facility] ED RN     Action/Plan:   ED CM spoke with pt to attempt to clarify if she is still seeing [facility] providers Provided pt with [employer] resources see note below   Action/Plan Detail:   Anticipated DC Date:  10/13/2013     Status Recommendation to Physician:   Result of Recommendation:    Other ED Services  Consult Working Plan    DC Planning Services  Other  Outpatient Services - Pt will follow up  PCP issues    Choice offered to / List presented to:            Status of service:  Completed, signed off  ED Comments:   ED Comments Detail:  CM spoke with pt who confirms self pay [facility] resident with no pcp. CM discussed and provided written information for self pay pcps, importance of pcp for f/u care, www.needymeds.org, discounted pharmacies and other [employer] such as [employer], [employer], affordable care act,  financial assistance, DSS and  health department Reviewed resources for [employer] self pay pcps like Jovita Kussmaul, family medicine at [address], [facility] family practice, general medical clinics, [facility] urgent care plus others, medication resources, [facility] out patient pharmacies and housing Pt voiced understanding and appreciation of resources provided  Provided [facility] contact information

## 2013-10-13 NOTE — Progress Notes (Signed)
Writer faxed referrals to the following facilities with open beds:  [facility], per Catlynn [phone] fax  [facility], per Teranda [phone] Fax  [facility], per Wendy [phone] Fax  [facility], per Tosha [phone] Fax  [facility], per Amy [phone] Fax  [facility], per Pat [phone] Fax   The following facilities are at capacity:  [facility], per Margaret  [facility], Erinn  [facility], per Wendy  [facility], per Carol  [facility], per Susie  [facility], per Christinia  [facility], per Kayla  [facility], per Erinn  [facility], perr Matt  [facility], per Lorey  [facility], per Tracey   No answer  [facility]  [facility]       
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
## 2013-10-13 NOTE — Progress Notes (Signed)
Patient has been accepted to [facility] Bed 400-1.  Writer informed the nurse.  The nurse will arrange transportation through [place].

## 2013-10-13 NOTE — ED Notes (Signed)
Patient has been up and walking most of the shift.  She has made frequent requests to discharge stating she is fine.  She then will talk about hearing voices, but is not sure if they are in her head or coming through the vents.  She has been reassured that she is in a safe place and will likely need to go to [facility] for a few more days of observation and medication management.  She is agreeable to this and has been cooperative.

## 2013-10-13 NOTE — Tx Team (Signed)
Initial Interdisciplinary Treatment Plan   PATIENT STRESSORS: Legal issue Occupational concerns Substance abuse   PROBLEM LIST: Problem List/Patient Goals Date to be addressed Date deferred Reason deferred Estimated date of resolution  psychosis 10/13/13     Suicidal ideation 10/13/13     Substance abuse 10/13/13     depression 10/13/13                                    DISCHARGE CRITERIA:  Ability to meet basic life and health needs Adequate post-discharge living arrangements Improved stabilization in mood, thinking, and/or behavior Medical problems require only outpatient monitoring Motivation to continue treatment in a less acute level of care Need for constant or close observation no longer present Reduction of life-threatening or endangering symptoms to within safe limits Safe-care adequate arrangements made Verbal commitment to aftercare and medication compliance  PRELIMINARY DISCHARGE PLAN: Outpatient therapy Return to previous living arrangement  PATIENT/FAMIILY INVOLVEMENT: This treatment plan has been presented to and reviewed with the patient, Leslie [name], and/or family member.  The patient and family have been given the opportunity to ask questions and make suggestions.  Loura Halt K 10/13/2013, 9:01 PM

## 2013-10-14 ENCOUNTER — Encounter (HOSPITAL_COMMUNITY): Payer: Self-pay | Admitting: Psychiatry

## 2013-10-14 DIAGNOSIS — F319 Bipolar disorder, unspecified: Secondary | ICD-10-CM

## 2013-10-14 DIAGNOSIS — R4689 Other symptoms and signs involving appearance and behavior: Secondary | ICD-10-CM

## 2013-10-14 DIAGNOSIS — R45851 Suicidal ideations: Secondary | ICD-10-CM

## 2013-10-14 DIAGNOSIS — R4589 Other symptoms and signs involving emotional state: Secondary | ICD-10-CM | POA: Diagnosis present

## 2013-10-14 LAB — TSH: TSH: 2.82 u[IU]/mL (ref 0.350–4.500)

## 2013-10-14 LAB — PREGNANCY, URINE: Preg Test, Ur: NEGATIVE

## 2013-10-14 MED ORDER — TRAZODONE HCL 100 MG PO TABS
100.0000 mg | ORAL_TABLET | Freq: Every day | ORAL | Status: DC
  Administered 2013-10-15 – 2013-10-18 (×4): 100 mg via ORAL
  Filled 2013-10-14 (×7): qty 1

## 2013-10-14 MED ORDER — NICOTINE 21 MG/24HR TD PT24
21.0000 mg | MEDICATED_PATCH | Freq: Every day | TRANSDERMAL | Status: DC
Start: 2013-10-14 — End: 2013-10-19
  Administered 2013-10-14 – 2013-10-19 (×6): 21 mg via TRANSDERMAL
  Filled 2013-10-14 (×8): qty 1

## 2013-10-14 MED ORDER — LITHIUM CARBONATE 300 MG PO CAPS
300.0000 mg | ORAL_CAPSULE | Freq: Two times a day (BID) | ORAL | Status: DC
  Administered 2013-10-14 – 2013-10-18 (×9): 300 mg via ORAL
  Filled 2013-10-14 (×14): qty 1

## 2013-10-14 NOTE — Progress Notes (Signed)
D: Writer greeted pt. Pt was informed of the indications of her newly scheduled meds for tonight.  Pt voluntarily took her scheduled medications. Pt required no further interventions at this time.  A: Writer administered scheduled medications to pt. Continued support and availability as needed was extended to this pt. Staff continue to monitor pt with q64min checks.  R: No adverse drug reactions noted. Pt receptive to treatment. Pt remains safe at this time.

## 2013-10-14 NOTE — Progress Notes (Signed)
D: Patient denies SI/HI and A/V hallucinations; patient reports is good; reports appetite is good ; reports energy level is normal ; reports ability to concentrate as good; rates depression as 5/10; rates hopelessness 5/10; rates anxiety as 5/10; reports goal for today is to become aware of the anger inside and start letting it go  A: Monitored q 15 minutes; patient encouraged to attend groups; patient educated about medications; patient given medications per physician orders; patient encouraged to express feelings and/or concerns  R: Patient is cooperative but flat; patient forwards information when asked; patient is attending groups and engaging; patient's speech can be rapid at times and she can be tangential
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
## 2013-10-14 NOTE — Progress Notes (Signed)
The focus of this group is to educate the patient on the purpose and policies of crisis stabilization and provide a format to answer questions about their admission.  The group details unit policies and expectations of patients while admitted. Patient did not attend this group. 

## 2013-10-14 NOTE — Progress Notes (Signed)
Adult Psychoeducational Group Note  Date:  10/14/2013 Time:  9:53 PM  Group Topic/Focus:  Wrap-Up Group:   The focus of this group is to help patients review their daily goal of treatment and discuss progress on daily workbooks.  Participation Level:  Minimal  Participation Quality:  Appropriate  Affect:  Appropriate  Cognitive:  Oriented  Insight: Limited  Engagement in Group:  Engaged  Modes of Intervention:  Socialization and Support  Additional Comments:  Patient attended and participated in group group tonight. She reports that her day went well.  She has been positive all day. She want to straighten her life back out. Today she went to her groups, went for meals, went outside and socialized in the day room.   Lita Mains [facility] 10/14/2013, 9:53 PM
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
## 2013-10-14 NOTE — Progress Notes (Signed)
D:  +ve AH- not command. Pt denies SI/HI/VH. Pt is  cooperative. Pt is in denial about her situation, pt blames everything and everyone but herself for her problems.   A: Pt was offered support and encouragement. Pt was given scheduled medications. Pt was encourage to attend groups. Q 15 minute checks were done for safety.   R:Pt attends groups and interacts well with peers and staff. Pt is taking medication. Pt receptive to treatment and safety maintained on unit.

## 2013-10-14 NOTE — [facility] Suicide Risk Assessment (Signed)
[facility] INPATIENT:  Family/Significant Other Suicide Prevention Education  Suicide Prevention Education:  Education Completed; Norberto Sorenson (pt's mother) [phone] has been identified by the patient as the family member/significant other with whom the patient will be residing, and identified as the person(s) who will aid the patient in the event of a mental health crisis (suicidal ideations/suicide attempt).  With written consent from the patient, the family member/significant other has been provided the following suicide prevention education, prior to the and/or following the discharge of the patient.  The suicide prevention education provided includes the following:  Suicide risk factors  Suicide prevention and interventions  National Suicide Hotline telephone number  [facility] assessment telephone number  [facility] Emergency Assistance 911  [facility] and/or Residential Mobile Crisis Unit telephone number  Request made of family/significant other to:  Remove weapons (e.g., guns, rifles, knives), all items previously/currently identified as safety concern.    Remove drugs/medications (over-the-counter, prescriptions, illicit drugs), all items previously/currently identified as a safety concern.  The family member/significant other verbalizes understanding of the suicide prevention education information provided.  The family member/significant other agrees to remove the items of safety concern listed above.  Smart, Madox Corkins LCSWA 10/14/2013, 1:45 PM
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
## 2013-10-14 NOTE — Progress Notes (Signed)
The focus of this group is to educate the patient on the purpose and policies of crisis stabilization and provide a format to answer questions about their admission.  The group details unit policies and expectations of patients while admitted. Patient attended this group. 
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
## 2013-10-15 NOTE — [facility] Group Notes (Signed)
[facility] LCSW Aftercare Discharge Planning Group Note   10/15/2013 10:27 AM  Participation Quality:  Engaged  Mood/Affect:  Appropriate  Depression Rating:  denies  Anxiety Rating:  denies  Thoughts of Suicide:  No Will you contract for safety?   NA  Current AVH:  No  Plan for Discharge/Comments:  Gene denies all symptoms today.  She reports that her father visited last night, and it was a good visit.  "He gave me money.  He and I get along better than my mother and me.  We are too much alike."  States her parents are raising her kids.  When another patient was talking about patience and how difficult it can be to wait, she stated playing with her children gives her pleasure, but the comment was unrelated to patience.  Transportation Means: family  Supports: family  [place], Baldo Daub

## 2013-10-15 NOTE — Progress Notes (Signed)
[facility] MD Progress Note  10/15/2013 8:20 AM Leslie [name]  MRN:  [id number] Subjective: " I am feeling better"  Objective: Patient continues to be anxious ,with pressured speech ,hyperactive,circumstantial with flight of ideas and loose associations . She does report  She feels calmer than yesterday on the Lithium and is fixated on discharge. She reports AH of mumbling ,unable to figure out what they are saying. Denies SI/HI/VH. Denies side effects of medications. Reports sleep as good and appetite as good.  Per staff patient continues to be hyperactive ,manic ,but improving.   Diagnosis:   DSM5: Primary psychiatric diagnosis:  Bipolar disorder Type I ,with mixed features  Secondary psychiatric diagnosis:  Cannabis use disorder per history  Stimulant (cocaine) use disorder per history  Benzodiazepine use disorder per history   Non psychiatric diagnosis:  Herpes per history  Hepatitis C per history.         Total Time spent with patient: 30 minutes   ADL's:  Intact  Sleep: Fair  Appetite:  Fair  Suicidal Ideation:  Denies  Homicidal Ideation:  Denies   AEB (as evidenced by):  Psychiatric Specialty Exam: Physical Exam  Constitutional: She is oriented to person, place, and time. She appears well-developed and well-nourished.  HENT:  Head: Normocephalic and atraumatic.  Eyes: Conjunctivae are normal. Pupils are equal, round, and reactive to light.  Neck: Normal range of motion.  Cardiovascular: Normal rate.   Respiratory: Effort normal.  GI: Soft.  Neurological: She is alert and oriented to person, place, and time.  Skin: Skin is warm and dry.  Psychiatric: Thought content normal. Her mood appears anxious. Her speech is rapid and/or pressured. She is hyperactive. Cognition and memory are normal. She expresses impulsivity. She exhibits a depressed mood.    Review of Systems  Constitutional: Negative.   HENT: Negative.   Eyes: Negative.   Respiratory:  Negative.   Cardiovascular: Negative.   Gastrointestinal: Negative.   Genitourinary: Negative.   Musculoskeletal: Negative.   Skin: Negative.   Neurological: Negative.   Psychiatric/Behavioral: The patient is nervous/anxious.     Blood pressure 109/74, pulse 103, temperature 98.2 F (36.8 C), temperature source Oral, resp. rate 18, height  (1.6 m), weight 56.7 kg (125 lb), last menstrual period 10/07/2013.Body mass index is 22.15 kg/(m^2).  General Appearance: Casual  Eye Contact::  Good  Speech:  Pressured  Volume:  Normal  Mood:  Anxious  Affect:  Labile  Thought Process:  Loose and Tangential  Orientation:  Full (Time, Place, and Person)  Thought Content:  Hallucinations: Auditory  Suicidal Thoughts:  No  Homicidal Thoughts:  No  Memory:  Immediate;   Fair Recent;   Fair Remote;   Fair  Judgement:  Fair  Insight:  Lacking  Psychomotor Activity:  Increased  Concentration:  Fair  Recall:  [employer] of Knowledge:Fair  Language: Good  Akathisia:  No    AIMS (if indicated):   0  Assets:  Communication Skills Desire for Improvement  Sleep:  Number of Hours: 6.5   Musculoskeletal: Strength & Muscle Tone: within normal limits Gait & Station: normal Patient leans: N/A  Current Medications: Current Facility-Administered Medications  Medication Dose Route Frequency Provider Last Rate Last Dose  . acetaminophen (TYLENOL) tablet 650 mg  650 mg Oral Q6H PRN Nelly Rout, MD      . alum & mag hydroxide-simeth (MAALOX/MYLANTA) 200-200-20 MG/5ML suspension 30 mL  30 mL Oral Q4H PRN Nelly Rout, MD      .  benztropine (COGENTIN) tablet 1 mg  1 mg Oral BID Nelly Rout, MD   1 mg at 10/15/13 0756  . haloperidol (HALDOL) tablet 5 mg  5 mg Oral BID Nelly Rout, MD   5 mg at 10/15/13 0756  . hydrOXYzine (ATARAX/VISTARIL) tablet 25 mg  25 mg Oral Q8H PRN Nelly Rout, MD      . lithium carbonate capsule 300 mg  300 mg Oral BID WC Jomarie Longs, MD   300 mg at 10/15/13  0756  . magnesium hydroxide (MILK OF MAGNESIA) suspension 30 mL  30 mL Oral Daily PRN Nelly Rout, MD      . nicotine (NICODERM CQ - dosed in mg/24 hours) patch 21 mg  21 mg Transdermal Daily Jomarie Longs, MD   21 mg at 10/15/13 0756  . traZODone (DESYREL) tablet 100 mg  100 mg Oral QHS Jomarie Longs, MD        Lab Results:  Results for orders placed during the hospital encounter of 10/13/13 (from the past 48 hour(s))  PREGNANCY, URINE     Status: None   Collection Time    10/14/13  3:18 PM      Result Value Ref Range   Preg Test, Ur NEGATIVE  NEGATIVE   Comment:            THE SENSITIVITY OF THIS     METHODOLOGY IS >20 mIU/mL.     Performed at [facility]  TSH     Status: None   Collection Time    10/14/13  7:47 PM      Result Value Ref Range   TSH 2.820  0.350 - 4.500 uIU/mL   Comment: Performed at [facility]    Physical Findings: AIMS: Facial and Oral Movements Muscles of Facial Expression: None, normal Lips and Perioral Area: None, normal Jaw: None, normal Tongue: None, normal,Extremity Movements Upper (arms, wrists, hands, fingers): None, normal Lower (legs, knees, ankles, toes): None, normal, Trunk Movements Neck, shoulders, hips: None, normal, Overall Severity Severity of abnormal movements (highest score from questions above): None, normal Incapacitation due to abnormal movements: None, normal Patient's awareness of abnormal movements (rate only patient's report): No Awareness, Dental Status Current problems with teeth and/or dentures?: No Does patient usually wear dentures?: No  CIWA:  CIWA-Ar Total: 0 COWS:  COWS Total Score: 1  Treatment Plan Summary: Daily contact with patient to assess and evaluate symptoms and progress in treatment Medication management  Plan: Patient has been improving on the Lithium started yesterday ,but continues to need inpatient stay for stabilization. Will continue patient on haldol as scheduled po  ,for psychosis.  Will continue  trial of lithium 300 mg po bid for mood swings. Li level on Saturday AM.(10/18/13) TSH reviewed -10/14/13 -wnl. Will continue  Trazodone for sleep.  Will make available prns for anxiety as well as agitation.     Medical Decision Making Problem Points:  Established problem, stable/improving (1) and Review of last therapy session (1) Data Points:  Order Aims Assessment (2) Review and summation of old records (2) Review of medication regiment & side effects (2)  I certify that inpatient services furnished can reasonably be expected to improve the patient's condition.   Shontelle Muska MD 10/15/2013, 8:20 AM

## 2013-10-15 NOTE — Plan of Care (Signed)
Problem: Ineffective individual coping Goal: LTG: Patient will report a decrease in negative feelings Outcome: Progressing Pt stated she thought the medications were working because she felt a little better.  Goal: STG: Pt will be able to identify effective and ineffective STG: Pt will be able to identify effective and ineffective coping patterns  Outcome: Progressing Pt stated she will try to work on getting angry, and she did not get angry today, per her own admission. Goal: STG: Patient will remain free from self harm Outcome: Progressing Pt has not caused any harm to self per 15 min checks

## 2013-10-15 NOTE — Progress Notes (Signed)
D: Patient denies SI/HI and A/V hallucinations; patient reports sleep is good; reports appetite is good; reports energy level is normal ; reports ability to concentrate is good; rates depression as 0-1/10; rates hopelessness 3/10; rates anxiety as 3/10; patient reports that she is ready to go home and that she thinks the medications is starting to help and also its helping with the mumbling; patient reports her goal for today is to listen better and she will achieve this by staying focused on positive outcomes for her situation per self inventory  A: Monitored q 15 minutes; patient encouraged to attend groups; patient educated about medications; patient given medications per physician orders; patient encouraged to express feelings and/or concerns  R: Patient's speech is still rapid and pressures at times; patient is minimizing her symptoms and just concentrating on going home; patient also is attending groups but is forwards little and insight is poor; patient is taking all medications as prescribed and tolerating medications

## 2013-10-15 NOTE — Clinical Social Work Note (Signed)
CSW met with pt individually today to check in. Pt shared that she will be contacting her mother today in order to work on living arrangements. "I am hoping that my aunt will take me in. She said that she would and my mom can help me out with that." Pt not wanting to go to shelter upon d/c. She is still willing to return to [facility] for med management/therapy services. CSW will check in with pt in the morning.   [place], [place] 10/15/2013 2:22 PM

## 2013-10-15 NOTE — Progress Notes (Signed)
Adult Psychoeducational Group Note  Date:  10/15/2013 Time:  9:56 PM  Group Topic/Focus:  Wrap-Up Group:   The focus of this group is to help patients review their daily goal of treatment and discuss progress on daily workbooks.  Participation Level:  Active  Participation Quality:  Appropriate  Affect:  Appropriate  Cognitive:  Appropriate  Insight: Appropriate  Engagement in Group:  Engaged  Modes of Intervention:  Discussion  Additional Comments:  The patient expressed that group was about having patients.The patient said that should could not express anything more about patients.   Octavio Manns 10/15/2013, 9:56 PM

## 2013-10-15 NOTE — [facility] Group Notes (Signed)
[facility] LCSW Group Therapy  10/15/2013 2:11 PM  Type of Therapy:  Group Therapy  Participation Level:  Active  Participation Quality:  Drowsy  Affect:  Lethargic  Cognitive:  Oriented  Insight:  Limited  Engagement in Therapy:  Improving  Modes of Intervention:  Discussion, Education, Exploration, Rapport Building, Socialization and Support  Summary of Progress/Problems: MHA Speaker came to talk about his personal journey with substance abuse and mental illness. Leslie [name] processed ways by which to relate to the speaker. MHA speaker provided handouts and educational information pertaining to groups and services offered by the [facility]. She was drowsy during majority of group but was able to ask some follow-up questions regarding the Mental health association groups offered.    Smart, [name] LCSWA  10/15/2013, 2:11 PM

## 2013-10-15 NOTE — Consult Note (Signed)
Case discussed, agree with plan 

## 2013-10-16 MED ORDER — HALOPERIDOL 5 MG PO TABS
5.0000 mg | ORAL_TABLET | Freq: Every day | ORAL | Status: DC
  Administered 2013-10-17 – 2013-10-19 (×3): 5 mg via ORAL
  Filled 2013-10-16 (×5): qty 1

## 2013-10-16 MED ORDER — HALOPERIDOL 5 MG PO TABS
10.0000 mg | ORAL_TABLET | Freq: Every evening | ORAL | Status: DC
  Administered 2013-10-16 – 2013-10-18 (×3): 10 mg via ORAL
  Filled 2013-10-16 (×5): qty 2

## 2013-10-16 NOTE — Progress Notes (Signed)
[facility] MD Progress Note  10/16/2013 8:32 AM Leslie [name]  MRN:  [id number] Subjective: " I am feel OK"  Objective: Patient continues to be hyperactive ,manic with pressured speech, elevated mood ,circumstantial speech with flight of ideas. Patient reports sleep as better. She reports anxiety as reduced, but she appears to be restless with psychomotor agitation . She denies VH/HI/SI.  Denies side effects of medications.  Per staff patient continues to be hyperactive ,manic ,but improving.   Diagnosis:   DSM5: Primary psychiatric diagnosis:  Bipolar disorder Type I ,with mixed features  Secondary psychiatric diagnosis:  Cannabis use disorder per history  Stimulant (cocaine) use disorder per history  Benzodiazepine use disorder per history   Non psychiatric diagnosis:  Herpes per history  Hepatitis C per history.         Total Time spent with patient: 30 minutes   ADL's:  Intact  Sleep: Fair  Appetite:  Fair  Suicidal Ideation:  Denies  Homicidal Ideation:  Denies   AEB (as evidenced by):  Psychiatric Specialty Exam: Physical Exam  Constitutional: She is oriented to person, place, and time. She appears well-developed and well-nourished.  HENT:  Head: Normocephalic and atraumatic.  Eyes: Conjunctivae are normal. Pupils are equal, round, and reactive to light.  Neck: Normal range of motion.  Cardiovascular: Normal rate.   Respiratory: Effort normal.  GI: Soft.  Neurological: She is alert and oriented to person, place, and time.  Skin: Skin is warm and dry.  Psychiatric: Thought content normal. Her mood appears anxious. Her speech is rapid and/or pressured. She is hyperactive. Cognition and memory are normal. She expresses impulsivity. She exhibits a depressed mood.    Review of Systems  Constitutional: Negative.   HENT: Negative.   Eyes: Negative.   Respiratory: Negative.   Cardiovascular: Negative.   Gastrointestinal: Negative.   Genitourinary:  Negative.   Musculoskeletal: Negative.   Skin: Negative.   Neurological: Negative.   Psychiatric/Behavioral: The patient is nervous/anxious.     Blood pressure 109/74, pulse 103, temperature 98.2 F (36.8 C), temperature source Oral, resp. rate 18, height  (1.6 m), weight 56.7 kg (125 lb), last menstrual period 10/07/2013.Body mass index is 22.15 kg/(m^2).  General Appearance: Casual  Eye Contact::  Good  Speech:  Pressured  Volume:  Normal  Mood:  Anxious  Affect:  Labile  Thought Process:  Loose and Tangential  Orientation:  Full (Time, Place, and Person)  Thought Content:  Hallucinations: Auditory improving  Suicidal Thoughts:  No  Homicidal Thoughts:  No  Memory:  Immediate;   Fair Recent;   Fair Remote;   Fair  Judgement:  Fair  Insight:  Lacking  Psychomotor Activity:  Increased  Concentration:  Fair  Recall:  [employer] of Knowledge:Fair  Language: Good  Akathisia:  No    AIMS (if indicated):   0  Assets:  Communication Skills Desire for Improvement  Sleep:  Number of Hours: 6.5   Musculoskeletal: Strength & Muscle Tone: within normal limits Gait & Station: normal Patient leans: N/A  Current Medications: Current Facility-Administered Medications  Medication Dose Route Frequency Provider Last Rate Last Dose  . acetaminophen (TYLENOL) tablet 650 mg  650 mg Oral Q6H PRN Nelly Rout, MD      . alum & mag hydroxide-simeth (MAALOX/MYLANTA) 200-200-20 MG/5ML suspension 30 mL  30 mL Oral Q4H PRN Nelly Rout, MD      . benztropine (COGENTIN) tablet 1 mg  1 mg Oral BID Nelly Rout, MD  1 mg at 10/16/13 1610  . haloperidol (HALDOL) tablet 10 mg  10 mg Oral QPM Jomarie Longs, MD      . Melene Muller ON 10/17/2013] haloperidol (HALDOL) tablet 5 mg  5 mg Oral Daily Hanan Moen, MD      . hydrOXYzine (ATARAX/VISTARIL) tablet 25 mg  25 mg Oral Q8H PRN Nelly Rout, MD      . lithium carbonate capsule 300 mg  300 mg Oral BID WC Jomarie Longs, MD   300 mg at 10/16/13  0813  . magnesium hydroxide (MILK OF MAGNESIA) suspension 30 mL  30 mL Oral Daily PRN Nelly Rout, MD      . nicotine (NICODERM CQ - dosed in mg/24 hours) patch 21 mg  21 mg Transdermal Daily Jomarie Longs, MD   21 mg at 10/16/13 0814  . traZODone (DESYREL) tablet 100 mg  100 mg Oral QHS Jomarie Longs, MD   100 mg at 10/15/13 2122    Lab Results:  Results for orders placed during the hospital encounter of 10/13/13 (from the past 48 hour(s))  PREGNANCY, URINE     Status: None   Collection Time    10/14/13  3:18 PM      Result Value Ref Range   Preg Test, Ur NEGATIVE  NEGATIVE   Comment:            THE SENSITIVITY OF THIS     METHODOLOGY IS >20 mIU/mL.     Performed at [facility]  TSH     Status: None   Collection Time    10/14/13  7:47 PM      Result Value Ref Range   TSH 2.820  0.350 - 4.500 uIU/mL   Comment: Performed at [facility]    Physical Findings: AIMS: Facial and Oral Movements Muscles of Facial Expression: None, normal Lips and Perioral Area: None, normal Jaw: None, normal Tongue: None, normal,Extremity Movements Upper (arms, wrists, hands, fingers): None, normal Lower (legs, knees, ankles, toes): None, normal, Trunk Movements Neck, shoulders, hips: None, normal, Overall Severity Severity of abnormal movements (highest score from questions above): None, normal Incapacitation due to abnormal movements: None, normal Patient's awareness of abnormal movements (rate only patient's report): No Awareness, Dental Status Current problems with teeth and/or dentures?: No Does patient usually wear dentures?: No  CIWA:  CIWA-Ar Total: 0 COWS:  COWS Total Score: 1  Treatment Plan Summary: Daily contact with patient to assess and evaluate symptoms and progress in treatment Medication management  Plan: Patient has been improving on the Lithium started yesterday ,but continues to need inpatient stay for stabilization. Will increase haldol to 15  mg po daily  ,for psychosis.  Will continue  trial of lithium 300 mg po bid for mood swings. Li level on Saturday AM.(10/18/13) TSH reviewed -10/14/13 -wnl. Will continue  Trazodone for sleep.  Will make available prns for anxiety as well as agitation.  CSW will work on disposition.  Patient to be discharged to her uncle's place per patient report. Patient to participate in therapeutic milieu.    Medical Decision Making Problem Points:  Established problem, stable/improving (1) and Review of last therapy session (1) Data Points:  Order Aims Assessment (2) Review and summation of old records (2) Review of medication regiment & side effects (2)  I certify that inpatient services furnished can reasonably be expected to improve the patient's condition.   Orine Goga MD 10/16/2013, 8:32 AM

## 2013-10-16 NOTE — [facility] Group Notes (Signed)
[facility] LCSW Group Therapy  10/16/2013 3:40 PM   Type of Therapy:  Group Therapy  Participation Level:  Active  Participation Quality:  Attentive  Affect:  Appropriate  Cognitive:  Appropriate  Insight:  Improving  Engagement in Therapy:  Engaged  Modes of Intervention:  Clarification, Education, Exploration and Socialization  Summary of Progress/Problems: Today's group focused on relapse prevention.  We defined the term, and then brainstormed on ways to prevent relapse.  Amberly started out strong.  She defined relapse "it when you fall off the wagon with drugs, alcohol or cigarettes" and shared that it has been an issue for her.  However, she became quiet and at some point in the first 15 minutes she got up and left, and did not return.  Daryel Gerald B 10/16/2013 , 3:40 PM

## 2013-10-16 NOTE — Psychosocial Assessment (Signed)
Adult Psychoeducational Group Note  Date:  10/16/2013 Time:  8:48 PM  Group Topic/Focus:  Wrap-Up Group:   The focus of this group is to help patients review their daily goal of treatment and discuss progress on daily workbooks.  Participation Level:  Active  Participation Quality:  Appropriate  Affect:  Appropriate  Cognitive:  Appropriate  Insight: Appropriate  Engagement in Group:  Engaged  Modes of Intervention:  Discussion  Additional Comments:    Edmonia Caprio 10/16/2013, 8:48 PM

## 2013-10-16 NOTE — [facility] Group Notes (Signed)
[facility] Group Notes:  (Nursing/MHT/Case Management/Adjunct)  Date:  10/16/2013  Time:  11:53 AM  Type of Therapy:  Nurse Education  Participation Level:  Minimal  Participation Quality:  Resistant  Affect:  Flat  Cognitive:  Alert  Insight:  None  Engagement in Group:  Resistant  Modes of Intervention:  Discussion and Education  Summary of Progress/Problems: Writer spoke to the theme of the day, which is leisure and lifestyle activity. Focusing primarily on medication compliance, nutrition, and better ways for patients to take better care of themselves. Patient came to group late but was non-disruptive when entering. Patient did not speak during group.  Marzetta Board E 10/16/2013, 11:53 AM

## 2013-10-16 NOTE — Progress Notes (Signed)
Patient ID: Leslie [name], female   DOB: September 21, 1980, 33 y.o.   MRN: [id number]  D: Pt. Denies SI/HI and A/V Hallucinations. Patient reported some mild discomfort in her ankles and received PRN Tylenol. Patient reported relief upon reassessment. Patient rates her depression and hopelessness 5/10 for the day. Patient reports anxiety at 3/10 for the day. Patient reports that her goal for the day is to have patience.  A: Support and encouragement provided to the patient to come to writer with any questions or concerns. Scheduled medications administered to patient per physician's orders.  R: Patient is receptive and cooperative with Clinical research associate but minimal. Patient is seen in the milieu and is attending groups. Q15 minute checks are maintained for safety.

## 2013-10-16 NOTE — Progress Notes (Signed)
D: Pt is currently denying any anxiety or depression. " I'm just tired". Pt presents with a flat affect this evening. Pt denied any SI/HI/AVH. Pt minimally participated within the milieu this evening.  A: Writer administered scheduled and prn medications to pt. Continued support and availability as needed was extended to this pt. Staff continue to monitor pt with q35min checks.  R: No adverse drug reactions noted. Pt receptive to treatment. Pt remains safe at this time.

## 2013-10-17 MED ORDER — IBUPROFEN 200 MG PO TABS
400.0000 mg | ORAL_TABLET | Freq: Four times a day (QID) | ORAL | Status: DC | PRN
  Administered 2013-10-17: 400 mg via ORAL
  Filled 2013-10-17: qty 2

## 2013-10-17 NOTE — Progress Notes (Signed)
[facility] Group Notes:  (Nursing/MHT/Case Management/Adjunct)  Date:  10/17/2013  Time:  9:18 PM  Type of Therapy:  Psychoeducational Skills  Participation Level:  Active  Participation Quality:  Resistant  Affect:  Depressed  Cognitive:  Appropriate  Insight:  Lacking  Engagement in Group:  Limited  Modes of Intervention:  Education  Summary of Progress/Problems: The patient stated that she had a good day overall and that she anticipates being discharged tomorrow. The patient states that she continues to have bad dreams and that they will continue to occur. Patient states that it was helpful for her to go outside to get fresh air. The patient was unable to talk about the theme of the day (relapse prevention) and verbalized that she will be returning to live with her uncle which she feels will lead her to relapse in the future.   Hazle Coca S 10/17/2013, 9:18 PM

## 2013-10-17 NOTE — Tx Team (Addendum)
Interdisciplinary Treatment Plan Update (Adult)   Date: 10/17/2013  Time Reviewed: 9:48 AM  Progress in Treatment:  Attending groups: Yes  Participating in groups:  Yes  Taking medication as prescribed: Yes  Tolerating medication: Yes  Family/Significant othe contact made: SPE completed with pt's mother.  Patient understands diagnosis: Yes, AEB seeking treatment for bipolar disorder/mania/AH, passive SI, and medication stabilization.  Discussing patient identified problems/goals with staff: Yes  Medical problems stabilized or resolved: Yes  Denies suicidal/homicidal ideation: Yes during self report.  Patient has not harmed self or Others: Yes  New problem(s) identified:  Discharge Plan or Barriers: Pt now plans to return to uncle's temporarily and will be moving in with an aunt in the next few days. She will follow-up at [facility] for med management and mh services. Pt refusing referral for any other services.  Additional comments: n/a  Reason for Continuation of Hospitalization: Medication Management Estimated length of stay: 1 days (d/c scheduled for Saturday after level taken per Dr. Elna Breslow) For review of initial/current patient goals, please see plan of care.  Attendees:  Patient:    Family:    Physician: Dr. Elna Breslow MD 10/17/2013 9:48 AM   Nursing: Lowanda Foster RN 10/17/2013 9:48 AM   Clinical Social Worker Basil Blakesley Smart, LCSWA  10/17/2013 9:48 AM  Other: Daryel Gerald, LCSW 10/17/2013 9:48 AM   Other: Mardella Layman RN 10/17/2013 9:49 AM   Other:Ann Tomasa Rand, LCSW 10/17/2013 9:49 AM   Other:  Natalia Leatherwood. CSW Intern 10/17/2013 9:49 AM   Scribe for Treatment Team:  Trula Slade LCSWA 10/17/2013 9:48 AM

## 2013-10-17 NOTE — Progress Notes (Signed)
[facility] MD Progress Note  10/17/2013 12:43 PM Leslie [name]  MRN:  [id number] Subjective: " I am feel OK"  Objective:Patient seen and chart reviewed. Patient is less manic today . Patient does have pressured speech ,but improving ,more logical,linear today. Patient is less irritable today. Denies AH/VH/SI/HI.  Denies side effects of medications.  Per staff patient is calm ,compliant on medications. Patient does endorse ankle pain. Will make prn's available.   Diagnosis:   DSM5: Primary psychiatric diagnosis:  Bipolar disorder Type I ,with mixed features  Secondary psychiatric diagnosis:  Cannabis use disorder per history  Stimulant (cocaine) use disorder per history  Benzodiazepine use disorder per history   Non psychiatric diagnosis:  Herpes per history  Hepatitis C per history.         Total Time spent with patient: 30 minutes   ADL's:  Intact  Sleep: Fair  Appetite:  Fair  Suicidal Ideation:  Denies  Homicidal Ideation:  Denies   AEB (as evidenced by):  Psychiatric Specialty Exam: Physical Exam  Constitutional: She is oriented to person, place, and time. She appears well-developed and well-nourished.  HENT:  Head: Normocephalic and atraumatic.  Eyes: Conjunctivae are normal. Pupils are equal, round, and reactive to light.  Neck: Normal range of motion.  Cardiovascular: Normal rate.   Respiratory: Effort normal.  GI: Soft.  Neurological: She is alert and oriented to person, place, and time.  Skin: Skin is warm and dry.  Psychiatric: Thought content normal. Her mood appears anxious. Her speech is rapid and/or pressured. She is hyperactive. Cognition and memory are normal. She expresses impulsivity. She exhibits a depressed mood.    Review of Systems  Constitutional: Negative.   HENT: Negative.   Eyes: Negative.   Respiratory: Negative.   Cardiovascular: Negative.   Gastrointestinal: Negative.   Genitourinary: Negative.   Musculoskeletal: Negative.    Skin: Negative.   Neurological: Negative.   Psychiatric/Behavioral: The patient is nervous/anxious.     Blood pressure 98/80, pulse 89, temperature 98.1 F (36.7 C), temperature source Oral, resp. rate 16, height  (1.6 m), weight 56.7 kg (125 lb), last menstrual period 10/07/2013.Body mass index is 22.15 kg/(m^2).  General Appearance: Casual  Eye Contact::  Good  Speech:  Pressured  Volume:  Normal  Mood:  Anxious  Affect:  Labile  Thought Process:  more goal directed  Orientation:  Full (Time, Place, and Person)  Thought Content:  Hallucinations: Auditory improving  Suicidal Thoughts:  No  Homicidal Thoughts:  No  Memory:  Immediate;   Fair Recent;   Fair Remote;   Fair  Judgement:  Fair  Insight:  Lacking  Psychomotor Activity:  Increased  Concentration:  Fair  Recall:  [employer] of Knowledge:Fair  Language: Good  Akathisia:  No    AIMS (if indicated):   0  Assets:  Communication Skills Desire for Improvement  Sleep:  Number of Hours: 6.5   Musculoskeletal: Strength & Muscle Tone: within normal limits Gait & Station: normal Patient leans: N/A  Current Medications: Current Facility-Administered Medications  Medication Dose Route Frequency Provider Last Rate Last Dose  . alum & mag hydroxide-simeth (MAALOX/MYLANTA) 200-200-20 MG/5ML suspension 30 mL  30 mL Oral Q4H PRN Nelly Rout, MD      . benztropine (COGENTIN) tablet 1 mg  1 mg Oral BID Nelly Rout, MD   1 mg at 10/17/13 0752  . haloperidol (HALDOL) tablet 10 mg  10 mg Oral QPM Arjay Jaskiewicz, MD   10 mg at  10/16/13 1708  . haloperidol (HALDOL) tablet 5 mg  5 mg Oral Daily Jensyn Shave, MD   5 mg at 10/17/13 0752  . hydrOXYzine (ATARAX/VISTARIL) tablet 25 mg  25 mg Oral Q8H PRN Nelly Rout, MD   25 mg at 10/17/13 0752  . ibuprofen (ADVIL,MOTRIN) tablet 400 mg  400 mg Oral Q6H PRN Jomarie Longs, MD      . lithium carbonate capsule 300 mg  300 mg Oral BID WC Jomarie Longs, MD   300 mg at 10/17/13  0752  . magnesium hydroxide (MILK OF MAGNESIA) suspension 30 mL  30 mL Oral Daily PRN Nelly Rout, MD      . nicotine (NICODERM CQ - dosed in mg/24 hours) patch 21 mg  21 mg Transdermal Daily Jomarie Longs, MD   21 mg at 10/17/13 0752  . traZODone (DESYREL) tablet 100 mg  100 mg Oral QHS Jomarie Longs, MD   100 mg at 10/16/13 2116    Lab Results:  No results found for this or any previous visit (from the past 48 hour(s)).  Physical Findings: AIMS: Facial and Oral Movements Muscles of Facial Expression: None, normal Lips and Perioral Area: None, normal Jaw: None, normal Tongue: None, normal,Extremity Movements Upper (arms, wrists, hands, fingers): None, normal Lower (legs, knees, ankles, toes): None, normal, Trunk Movements Neck, shoulders, hips: None, normal, Overall Severity Severity of abnormal movements (highest score from questions above): None, normal Incapacitation due to abnormal movements: None, normal Patient's awareness of abnormal movements (rate only patient's report): No Awareness, Dental Status Current problems with teeth and/or dentures?: No Does patient usually wear dentures?: No  CIWA:  CIWA-Ar Total: 0 COWS:  COWS Total Score: 1  Treatment Plan Summary: Daily contact with patient to assess and evaluate symptoms and progress in treatment Medication management  Plan: Patient has been improving on the Lithium . Will order Li level for tomorrow AM . If levels therapeutic and patient continues to be stable,she can be discharged tomorrow. Please refer to csw notes. Will continue  haldol  15 mg po daily  ,for psychosis.  Will continue  trial of lithium 300 mg po bid for mood swings.  TSH reviewed -10/14/13 -wnl. Will continue  Trazodone for sleep.  Will make available prns for anxiety as well as agitation.  CSW will work on disposition.  Patient to be discharged to her uncle's place per patient report. Patient to participate in therapeutic milieu.    Medical  Decision Making Problem Points:  Established problem, stable/improving (1) and Review of last therapy session (1) Data Points:  Order Aims Assessment (2) Review and summation of old records (2) Review of medication regiment & side effects (2)  I certify that inpatient services furnished can reasonably be expected to improve the patient's condition.   Kennett Symes MD 10/17/2013, 12:43 PM
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
## 2013-10-17 NOTE — Progress Notes (Signed)
[facility] Adult Case Management Discharge Plan :  Will you be returning to the same living situation after discharge: Yes,  home with uncle until room available at aunt's home At discharge, do you have transportation home?:Yes,  mother or family member on Saturday 9/12 Do you have the ability to pay for your medications:Yes,  mental health  Release of information consent forms completed and submitted to Medical Records by CSW.  Patient to Follow up at: Follow-up Information   Follow up with [facility].    Walk in between 8am-9am Monday through Friday for hospital follow-up/medication management/assessment for therapy services.   Patient denies SI/HI:   Yes,  during group/self report.    Safety Planning and Suicide Prevention discussed:  Yes,  SPE completed with pt's mother. SPI pamphlet provided to pt and she was encouraged to share information with support network, ask questions, and talk about any concerns relating to SPE.  Smart, Leslie [name] LCSWA  10/17/2013, 10:11 AM

## 2013-10-17 NOTE — [facility] Group Notes (Signed)
[facility] LCSW Group Therapy  10/17/2013  1:05 PM  Type of Therapy:  Group therapy  Participation Level:  Active  Participation Quality:  Attentive  Affect:  Flat  Cognitive:  Oriented  Insight:  Limited  Engagement in Therapy:  Limited  Modes of Intervention:  Discussion, Socialization  Summary of Progress/Problems:  Chaplain was here to lead a group on theme of care. Leslie [name] was attentive during today's group. She presented with agitated mood, pressured and tangential speech. [name] shared that she feels that Care means "trusing others and taking care of myself." After making her statement about not experiencing care from her family right now, she left group and did not return.  [employer], LCSWA   10/17/2013 1:56 PM

## 2013-10-17 NOTE — [facility] Group Notes (Signed)
[facility] LCSW Aftercare Discharge Planning Group Note   10/17/2013 11:53 AM  Participation Quality:  Engaged  Mood/Affect:  Appropriate  Depression Rating:  3  Anxiety Rating:  0  Thoughts of Suicide:  No Will you contract for safety?   NA  Current AVH:  No  Plan for Discharge/Comments:  This is the first group that Leslie [name] has stayed for.  She began by complaining about feeling sedated, and I explained how medication works.  She went on to talk about the obstacles and challenges she faces [felony, unemployment, no money, lack of supports] but not in a complaining way.  Rather, she took responsibility for her poor choices, and simply stated that others are choosing to not support her and let her sink or swim on her own Leslie [name] was referring specifically to her mother, I believe.]  Will return to stay with uncle at d/c.  Transportation Means: family  Supports: family  Leslie [name], Leslie [name]

## 2013-10-18 DIAGNOSIS — F1210 Cannabis abuse, uncomplicated: Secondary | ICD-10-CM

## 2013-10-18 DIAGNOSIS — F1310 Sedative, hypnotic or anxiolytic abuse, uncomplicated: Secondary | ICD-10-CM

## 2013-10-18 DIAGNOSIS — F1410 Cocaine abuse, uncomplicated: Secondary | ICD-10-CM

## 2013-10-18 LAB — LITHIUM LEVEL: Lithium Lvl: 0.37 mEq/L — ABNORMAL LOW (ref 0.80–1.40)

## 2013-10-18 MED ORDER — LITHIUM CARBONATE 300 MG PO CAPS
300.0000 mg | ORAL_CAPSULE | Freq: Once | ORAL | Status: AC
  Administered 2013-10-18: 300 mg via ORAL
  Filled 2013-10-18: qty 1

## 2013-10-18 MED ORDER — LITHIUM CARBONATE 300 MG PO CAPS
600.0000 mg | ORAL_CAPSULE | Freq: Two times a day (BID) | ORAL | Status: DC
Start: 2013-10-18 — End: 2013-10-19
  Administered 2013-10-18 – 2013-10-19 (×2): 600 mg via ORAL
  Filled 2013-10-18 (×6): qty 2

## 2013-10-18 NOTE — [facility] Group Notes (Signed)
[facility] Group Notes:  (Clinical Social Work)  10/18/2013  11:15-12:00PM  Summary of Progress/Problems:   The main focus of today's process group was to discuss patients' feelings about hospitalization, the stigma attached to mental health, and sources of motivation to stay well.  We then worked to identify a specific plan to avoid future hospitalizations when discharged from the hospital for this admission.  The patient expressed anger and frustration that she is not getting out of the hospital today.  She understands that her medication dosage is being increased, but states that it is detrimental to her to be apart from her children, that she missed her once-a-week visitation.  She became very angry and circumstantial in her speech and had to be cut off and the discussion redirected.  Type of Therapy:  Group Therapy - Process  Participation Level:  Active  Participation Quality:  Attentive  Affect:  Blunted and Depressed  Cognitive:  Appropriate  Insight:  Developing/Improving  Engagement in Therapy:  Developing/Improving  Modes of Intervention:  Exploration, Discussion  Ambrose Mantle, LCSW 10/18/2013, 1:13 PM

## 2013-10-18 NOTE — [facility] Group Notes (Addendum)
[facility] Group Notes:  (Nursing/MHT/Case Management/Adjunct)  Date:  10/18/2013  Time:  10:07 AM  Type of Therapy:  Self inventory review  Participation Level:  Active  Participation Quality:  Appropriate  Affect:  Irritable  Cognitive:  Disorganized and Lacking  Insight:  Lacking  Engagement in Group:  Lacking and Limited  Modes of Intervention:  Discussion, Education and Exploration  Summary of Progress/Problems:  Leslie [name] 10/18/2013, 10:07 AM

## 2013-10-18 NOTE — Progress Notes (Signed)
Attempted to speak with pt 1:1 who was minimally receptive. Little to no eye contact and patient went about her business as this Clinical research associate attempted to assess herl. She reports being tired and just wanting to go to bed as soon as possible. She remains guarded and suspicious. Medicated per orders with education provided. Support and reassurance offered. She denies SI/HI/AVH at this time and voices no pain or problems. Pt remains safe and focused on discharge. Lawrence Marseilles

## 2013-10-18 NOTE — Progress Notes (Signed)
Report received from B. McNichols RN. Patient currently lying in bed asleep, no distress noted. Safety maintained on unit with 15 min checks. 

## 2013-10-18 NOTE — Progress Notes (Signed)
D. Pt has been up and has been active while in the milieu this evening, attended and participated in evening wrap-up group. Pt spoke about having a good day and spoke about getting a lithium blood level in the morning and is hopeful for discharge. Pt received medications without incident and does not verbalize any SI. A. Support and encouragement provided. R. Safety maintained, will continue to monitor.

## 2013-10-18 NOTE — Plan of Care (Signed)
Problem: Ineffective individual coping Goal: STG: Patient will participate in after care plan Pt plans to follow-up at [facility] for med management/assessment for therapy services. She would rather go to shelter in [place] than return home with her uncle. CSW provided pt with shelter info. Goal met. [place], [place] 10/14/2013 10:15 AM  Outcome: Progressing Patient has been involved in discharge plan and is able to verbalize arrangements for when she leave.  Problem: Diagnosis: Increased Risk For Suicide Attempt Goal: STG-Patient Will Comply With Medication Regime Outcome: Progressing Patient has been med compliant.

## 2013-10-18 NOTE — Progress Notes (Signed)
Patient ID: Leslie [name], femaleMAXINE [name]-Jan-1982, 33 y.o.   MRN: [id number] D. Patient presents with irritable mood, affect congruent.Leslie [name] presents with pressured speech, and her thoughts remain tangential at times. She states '' I want to go home. I want to get back to see my kids. '' Writer in with MD to discuss patients progress. She was informed that lithium level sub therapeutic- and it was explained to the patient that she was unable to be discharged at this time.Patient became agitated, irritable yelling stating '' well what if I don't even want to take this lithium, what if these pills are just a science experiment! The only chance I will get to see me kids is tomorrow and I want to be discharged now! '' . A. Medications given as ordered. Support and encouragement provided.Pt care discussed with Dr. Lynnae Sandhoff. . R. Patient is safe. She is in no acute distress at this time, no further voiced concerns at this time. Will continue to monitor q 15 minutes for safety.

## 2013-10-18 NOTE — [facility] Group Notes (Signed)
[facility] Group Notes:  (Nursing/MHT/Case Management/Adjunct)  Date:  10/18/2013  Time:  10:04 AM  Type of Therapy:  Psychoeducational Skills  Participation Level:  Active  Participation Quality:  Appropriate  Affect:  Labile and Not Congruent  Cognitive:  Disorganized  Insight:  Lacking  Engagement in Group:  Distracting  Modes of Intervention:  Discussion, Education and Exploration  Summary of Progress/Problems:  Leslie [name] 10/18/2013, 10:04 AM

## 2013-10-18 NOTE — Progress Notes (Signed)
Patient ID: Leslie [name], female   DOB: 28-Jun-1980, 33 y.o.   MRN: [id number] [facility] MD Progress Note  10/18/2013 11:36 AM Leslie [name]  MRN:  [id number] Subjective: " I am feel OK" i need to be discharged.  Objective:Patient seen and chart reviewed. Patient remains hpyerverabal and got irritable when we talked about her lithium level subnormal. Says she is doing fine. Somewhat circumstantial and remains focused on discharge to visit her kids. Explained that we have notes from her primary inpatient doctor that Lithium level need to be checked. Patient does have pressured speech ,but improving, does talk cooperatively till we touch the topic of possible not being discharged. linear today. Denies AH/VH/SI/HI.  Denies side effects of medications.  Per staff patient is calm ,compliant on medications. Patient does endorse ankle pain. Will make prn's available.   Diagnosis:   DSM5: Primary psychiatric diagnosis:  Bipolar disorder Type I ,with mixed features  Secondary psychiatric diagnosis:  Cannabis use disorder per history  Stimulant (cocaine) use disorder per history  Benzodiazepine use disorder per history   Non psychiatric diagnosis:  Herpes per history  Hepatitis C per history.         Total Time spent with patient: 30 minutes   ADL's:  Intact  Sleep: Fair  Appetite:  Fair  Suicidal Ideation:  Denies  Homicidal Ideation:  Denies   AEB (as evidenced by):  Psychiatric Specialty Exam: Physical Exam  Constitutional: She is oriented to person, place, and time. She appears well-developed and well-nourished.  HENT:  Head: Normocephalic and atraumatic.  Eyes: Conjunctivae are normal. Pupils are equal, round, and reactive to light.  Neck: Normal range of motion.  Cardiovascular: Normal rate.   Respiratory: Effort normal.  GI: Soft.  Neurological: She is alert and oriented to person, place, and time.  Skin: Skin is warm and dry.  Psychiatric: Thought content normal.  Her mood appears anxious. Her speech is rapid and/or pressured. She is hyperactive. Cognition and memory are normal. She expresses impulsivity. She exhibits a depressed mood.    Review of Systems  Constitutional: Negative for fever.  Cardiovascular: Negative for chest pain.  Gastrointestinal: Negative for nausea and diarrhea.  Musculoskeletal: Negative for myalgias.  Skin: Negative.   Neurological: Negative for dizziness, tremors and headaches.  Psychiatric/Behavioral: Negative for suicidal ideas and hallucinations. The patient is nervous/anxious.     Blood pressure 96/66, pulse 107, temperature 97.9 F (36.6 C), temperature source Oral, resp. rate 16, height  (1.6 m), weight 125 lb (56.7 kg), last menstrual period 10/07/2013.Body mass index is 22.15 kg/(m^2).  General Appearance: Casual  Eye Contact::  Good  Speech:  Pressured  Volume:  Normal  Mood:  Anxious  Affect:  Labile  Thought Process:  more goal directed  Orientation:  Full (Time, Place, and Person)  Thought Content:  Hallucinations: Auditory improving  Suicidal Thoughts:  No  Homicidal Thoughts:  No  Memory:  Immediate;   Fair Recent;   Fair Remote;   Fair  Judgement:  Fair  Insight:  Lacking  Psychomotor Activity:  Increased  Concentration:  Fair  Recall:  [employer] of Knowledge:Fair  Language: Good  Akathisia:  No    AIMS (if indicated):   0  Assets:  Communication Skills Desire for Improvement  Sleep:  Number of Hours: 6.75   Musculoskeletal: Strength & Muscle Tone: within normal limits Gait & Station: normal Patient leans: N/A  Current Medications: Current Facility-Administered Medications  Medication Dose Route Frequency Provider Last  Rate Last Dose  . alum & mag hydroxide-simeth (MAALOX/MYLANTA) 200-200-20 MG/5ML suspension 30 mL  30 mL Oral Q4H PRN Nelly Rout, MD      . benztropine (COGENTIN) tablet 1 mg  1 mg Oral BID Nelly Rout, MD   1 mg at 10/18/13 0806  . haloperidol (HALDOL)  tablet 10 mg  10 mg Oral QPM Saramma Eappen, MD   10 mg at 10/17/13 1557  . haloperidol (HALDOL) tablet 5 mg  5 mg Oral Daily Jomarie Longs, MD   5 mg at 10/18/13 0806  . hydrOXYzine (ATARAX/VISTARIL) tablet 25 mg  25 mg Oral Q8H PRN Nelly Rout, MD   25 mg at 10/17/13 0752  . ibuprofen (ADVIL,MOTRIN) tablet 400 mg  400 mg Oral Q6H PRN Jomarie Longs, MD   400 mg at 10/17/13 1558  . lithium carbonate capsule 300 mg  300 mg Oral Once Thresa Ross, MD      . lithium carbonate capsule 600 mg  600 mg Oral BID WC Thresa Ross, MD      . magnesium hydroxide (MILK OF MAGNESIA) suspension 30 mL  30 mL Oral Daily PRN Nelly Rout, MD      . nicotine (NICODERM CQ - dosed in mg/24 hours) patch 21 mg  21 mg Transdermal Daily Jomarie Longs, MD   21 mg at 10/18/13 1610  . traZODone (DESYREL) tablet 100 mg  100 mg Oral QHS Jomarie Longs, MD   100 mg at 10/17/13 2115    Lab Results:  Results for orders placed during the hospital encounter of 10/13/13 (from the past 48 hour(s))  LITHIUM LEVEL     Status: Abnormal   Collection Time    10/18/13  6:30 AM      Result Value Ref Range   Lithium Lvl 0.37 (*) 0.80 - 1.40 mEq/L   Comment: Performed at [facility]    Physical Findings: AIMS: Facial and Oral Movements Muscles of Facial Expression: None, normal Lips and Perioral Area: None, normal Jaw: None, normal Tongue: None, normal,Extremity Movements Upper (arms, wrists, hands, fingers): None, normal Lower (legs, knees, ankles, toes): None, normal, Trunk Movements Neck, shoulders, hips: None, normal, Overall Severity Severity of abnormal movements (highest score from questions above): None, normal Incapacitation due to abnormal movements: None, normal Patient's awareness of abnormal movements (rate only patient's report): No Awareness, Dental Status Current problems with teeth and/or dentures?: No Does patient usually wear dentures?: No  CIWA:  CIWA-Ar Total: 0 COWS:  COWS  Total Score: 1  Treatment Plan Summary: Daily contact with patient to assess and evaluate symptoms and progress in treatment Medication management  Plan: Patient has been improving on the Lithium  But lithium level below normal and she remains somewhat irrational and hyperverbal. Will increase lithium 600 bid. If mood continues to improve then possible discharge tomorrow.  Will continue  haldol  15 mg po daily  ,for psychosis.  Will continue  trial of lithium 300 mg po bid for mood swings.  TSH reviewed -10/14/13 -wnl. Will continue  Trazodone for sleep.  Will make available prns for anxiety as well as agitation.  CSW will work on disposition.  Patient to be discharged to her uncle's place per patient report. Patient to participate in therapeutic milieu.    Medical Decision Making Problem Points:  Established problem, stable/improving (1) and Review of last therapy session (1) Data Points:  Order Aims Assessment (2) Review and summation of old records (2) Review of medication regiment & side effects (  2)  I certify that inpatient services furnished can reasonably be expected to improve the patient's condition.   Gilmore Laroche, Roland Prine MD 10/18/2013, 11:36 AM

## 2013-10-19 DIAGNOSIS — F3160 Bipolar disorder, current episode mixed, unspecified: Principal | ICD-10-CM

## 2013-10-19 MED ORDER — HALOPERIDOL 5 MG PO TABS: 5.0000 mg | ORAL_TABLET | Freq: Every morning | ORAL | Status: DC

## 2013-10-19 MED ORDER — HALOPERIDOL 10 MG PO TABS: 10.0000 mg | ORAL_TABLET | Freq: Every evening | ORAL | Status: DC

## 2013-10-19 MED ORDER — HYDROXYZINE HCL 25 MG PO TABS: 25.0000 mg | ORAL_TABLET | Freq: Three times a day (TID) | ORAL | Status: DC | PRN

## 2013-10-19 MED ORDER — LITHIUM CARBONATE 600 MG PO CAPS: 600.0000 mg | ORAL_CAPSULE | Freq: Two times a day (BID) | ORAL | Status: DC

## 2013-10-19 MED ORDER — BENZTROPINE MESYLATE 1 MG PO TABS: 1.0000 mg | ORAL_TABLET | Freq: Two times a day (BID) | ORAL | Status: DC

## 2013-10-19 MED ORDER — TRAZODONE HCL 100 MG PO TABS: 100.0000 mg | ORAL_TABLET | Freq: Every day | ORAL | Status: DC

## 2013-10-19 NOTE — [facility] Group Notes (Signed)
[facility] Group Notes:  (Clinical Social Work)  10/19/2013   11:15am-12:00pm  Summary of Progress/Problems:  The main focus of today's process group was to listen to a variety of genres of music and to identify that different types of music provoke different responses.  The patient then was able to identify personally what was soothing for them, as well as energizing.  Handouts were used to record feelings evoked, as well as how patient can personally use this knowledge in sleep habits, with depression, and with other symptoms.  The patient expressed understanding of concepts, as well as knowledge of how each type of music affected her and how this can be used at home as a wellness/recovery tool.  Type of Therapy:  Music Therapy   Participation Level:  Active  Participation Quality:  Attentive and Sharing  Affect:  Blunted  Cognitive:  Oriented  Insight:  Engaged  Engagement in Therapy:  Engaged  Modes of Intervention:   Activity, Exploration  Maicie Vanderloop Grossman-Orr, LCSW 10/19/2013, 12:30pm    

## 2013-10-19 NOTE — [facility] Group Notes (Signed)
[facility] Group Notes: healthy coping skills  Date:  10/19/2013  Time:  10:40 AM  Type of Therapy:  Nurse Education  Participation Level:  Active  Participation Quality:  Appropriate  Affect:  Appropriate  Cognitive:  Alert  Insight:  Appropriate  Engagement in Group:  Engaged  Modes of Intervention:  Education  Summary of Progress/Problems:  Nicole Cella 10/19/2013, 10:40 AM

## 2013-10-19 NOTE — Progress Notes (Signed)
Psychoeducational Group Note  Date:  10/19/2013 Time:  0030  Group Topic/Focus:  Wrap-Up Group:   The focus of this group is to help patients review their daily goal of treatment and discuss progress on daily workbooks.  Participation Level: Did Not Attend  Participation Quality:  Not Applicable  Affect:  Not Applicable  Cognitive:  Not Applicable  Insight:  Not Applicable  Engagement in Group: Not Applicable  Additional Comments:  The patient did not attend group last evening since she was asleep in her bed.   Karisha Marlin S 10/19/2013, 12:29 AM
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
## 2013-10-19 NOTE — Progress Notes (Signed)
Pt resting in bed with eyes closed. No distress noted. Will continue to monitor closely and evaluate for stabilization.  

## 2013-10-19 NOTE — [facility] Group Notes (Signed)
[facility] Group Notes:  Self inventory  Date:  10/19/2013  Time:  0930  Type of Therapy:  Nurse Education  Participation Level:  Active  Participation Quality:  Appropriate  Affect:  Appropriate  Cognitive:  Appropriate  Insight:  Appropriate  Engagement in Group:  Engaged  Modes of Intervention:  Discussion  Summary of Progress/Problems:  Nicole Cella 10/19/2013, 10:39 AM

## 2013-10-19 NOTE — Progress Notes (Signed)
Patient ID: Leslie [name], female   DOB: [birth year], 33 y.o.   MRN: [id number] Discharge Note. Discharge orders received from MD. Patient denies any SI/HI /A/V Hallucinations, she states ''I'm ready to go home so I can see my kids. '' No signs of acute decompensation. Pt states '' The medications have helped me, i think the lithium is working good.'' Discharge instructions reviewed at length , AVS copy reviewed and patient verbalized understanding. Crisis services reviewed at length. Medications reviewed, Rx given to patient with 14 day free supply of medication. All belongings returned. Pt escorted from unit to the care of family in lobby.
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
## 2013-10-22 NOTE — Progress Notes (Signed)
Patient Discharge Instructions:  After Visit Summary (AVS):   Faxed to:  10/22/13 Psychiatric Admission Assessment Note:   Faxed to:  10/22/13 Suicide Risk Assessment - Discharge Assessment:   Faxed to:  10/22/13 Faxed/Sent to the Next Level Care provider:  10/22/13 Faxed to [facility] @ [phone]   Jerelene Redden, 10/22/2013, 2:27 PM

## 2013-11-20 ENCOUNTER — Emergency Department (HOSPITAL_COMMUNITY)
Admission: EM | Admit: 2013-11-20 | Discharge: 2013-11-21 | Disposition: A | Discharge status: Other Acute Inpatient Hospital | Payer: Medicaid Other | Attending: Emergency Medicine | Admitting: Emergency Medicine

## 2013-11-20 ENCOUNTER — Encounter (HOSPITAL_COMMUNITY): Payer: Self-pay | Admitting: Emergency Medicine

## 2013-11-20 DIAGNOSIS — F29 Unspecified psychosis not due to a substance or known physiological condition: Secondary | ICD-10-CM | POA: Diagnosis present

## 2013-11-20 DIAGNOSIS — Z79899 Other long term (current) drug therapy: Secondary | ICD-10-CM | POA: Insufficient documentation

## 2013-11-20 DIAGNOSIS — F23 Brief psychotic disorder: Secondary | ICD-10-CM

## 2013-11-20 DIAGNOSIS — F419 Anxiety disorder, unspecified: Secondary | ICD-10-CM | POA: Insufficient documentation

## 2013-11-20 DIAGNOSIS — Z8619 Personal history of other infectious and parasitic diseases: Secondary | ICD-10-CM | POA: Insufficient documentation

## 2013-11-20 DIAGNOSIS — R440 Auditory hallucinations: Secondary | ICD-10-CM

## 2013-11-20 DIAGNOSIS — Z3202 Encounter for pregnancy test, result negative: Secondary | ICD-10-CM | POA: Insufficient documentation

## 2013-11-20 DIAGNOSIS — Z720 Tobacco use: Secondary | ICD-10-CM | POA: Insufficient documentation

## 2013-11-20 DIAGNOSIS — R000 Tachycardia, unspecified: Secondary | ICD-10-CM | POA: Insufficient documentation

## 2013-11-20 DIAGNOSIS — F329 Major depressive disorder, single episode, unspecified: Secondary | ICD-10-CM | POA: Insufficient documentation

## 2013-11-20 LAB — CK: Total CK: 68 U/L (ref 7–177)

## 2013-11-20 LAB — COMPREHENSIVE METABOLIC PANEL
ALBUMIN: 4.2 g/dL (ref 3.5–5.2)
ALK PHOS: 58 U/L (ref 39–117)
ALT: 44 U/L — AB (ref 0–35)
AST: 26 U/L (ref 0–37)
Anion gap: 13 (ref 5–15)
BUN: 13 mg/dL (ref 6–23)
CALCIUM: 9.1 mg/dL (ref 8.4–10.5)
CO2: 22 mEq/L (ref 19–32)
Chloride: 99 mEq/L (ref 96–112)
Creatinine, Ser: 0.55 mg/dL (ref 0.50–1.10)
GFR calc Af Amer: >90 mL/min (ref >90)
GFR calc non Af Amer: >90 mL/min (ref >90)
Glucose, Bld: 98 mg/dL (ref 70–99)
POTASSIUM: 3.8 meq/L (ref 3.7–5.3)
SODIUM: 134 meq/L — AB (ref 137–147)
Total Bilirubin: 0.2 mg/dL — ABNORMAL LOW (ref 0.3–1.2)
Total Protein: 7.9 g/dL (ref 6.0–8.3)

## 2013-11-20 LAB — CBC WITH DIFFERENTIAL/PLATELET
BASOS ABS: 0.1 10*3/uL (ref 0.0–0.1)
Basophils Relative: 1 % (ref 0–1)
EOS ABS: 0.2 10*3/uL (ref 0.0–0.7)
EOS PCT: 2 % (ref 0–5)
HCT: 38.5 % (ref 36.0–46.0)
Hemoglobin: 13.2 g/dL (ref 12.0–15.0)
Lymphocytes Relative: 17 % (ref 12–46)
Lymphs Abs: 2 10*3/uL (ref 0.7–4.0)
MCH: 30.3 pg (ref 26.0–34.0)
MCHC: 34.3 g/dL (ref 30.0–36.0)
MCV: 88.3 fL (ref 78.0–100.0)
Monocytes Absolute: 0.6 10*3/uL (ref 0.1–1.0)
Monocytes Relative: 5 % (ref 3–12)
Neutro Abs: 9.2 10*3/uL — ABNORMAL HIGH (ref 1.7–7.7)
Neutrophils Relative %: 75 % (ref 43–77)
PLATELETS: 304 10*3/uL (ref 150–400)
RBC: 4.36 MIL/uL (ref 3.87–5.11)
RDW: 12 % (ref 11.5–15.5)
WBC: 12.1 10*3/uL — ABNORMAL HIGH (ref 4.0–10.5)

## 2013-11-20 LAB — RAPID URINE DRUG SCREEN, HOSP PERFORMED
AMPHETAMINES: NOT DETECTED
BARBITURATES: NOT DETECTED
BENZODIAZEPINES: NOT DETECTED
Cocaine: NOT DETECTED
Opiates: NOT DETECTED
Tetrahydrocannabinol: NOT DETECTED

## 2013-11-20 LAB — ETHANOL

## 2013-11-20 LAB — SALICYLATE LEVEL: Salicylate Lvl: <2 mg/dL — ABNORMAL LOW (ref 2.8–20.0)

## 2013-11-20 LAB — ACETAMINOPHEN LEVEL: Acetaminophen (Tylenol), Serum: <15 ug/mL (ref 10–30)

## 2013-11-20 LAB — POC URINE PREG, ED: PREG TEST UR: NEGATIVE

## 2013-11-20 MED ORDER — HYDROXYZINE HCL 25 MG PO TABS: 25.0000 mg | ORAL_TABLET | Freq: Three times a day (TID) | ORAL | Status: DC | PRN

## 2013-11-20 MED ORDER — LORAZEPAM 2 MG/ML IJ SOLN
INTRAMUSCULAR | Status: AC
  Administered 2013-11-20: 1 mg via INTRAMUSCULAR
  Filled 2013-11-20: qty 1

## 2013-11-20 MED ORDER — IBUPROFEN 200 MG PO TABS: 600.0000 mg | ORAL_TABLET | Freq: Three times a day (TID) | ORAL | Status: DC | PRN

## 2013-11-20 MED ORDER — DIPHENHYDRAMINE HCL 50 MG/ML IJ SOLN
INTRAMUSCULAR | Status: AC
  Administered 2013-11-20: 25 mg via INTRAMUSCULAR
  Filled 2013-11-20: qty 1

## 2013-11-20 MED ORDER — TRAZODONE HCL 100 MG PO TABS: 100.0000 mg | ORAL_TABLET | Freq: Every day | ORAL | Status: DC

## 2013-11-20 MED ORDER — BENZTROPINE MESYLATE 1 MG PO TABS
1.0000 mg | ORAL_TABLET | Freq: Two times a day (BID) | ORAL | Status: DC
  Administered 2013-11-20 – 2013-11-21 (×2): 1 mg via ORAL
  Filled 2013-11-20 (×2): qty 1

## 2013-11-20 MED ORDER — NICOTINE 21 MG/24HR TD PT24
21.0000 mg | MEDICATED_PATCH | Freq: Every day | TRANSDERMAL | Status: DC
  Administered 2013-11-20: 21 mg via TRANSDERMAL
  Filled 2013-11-20: qty 1

## 2013-11-20 MED ORDER — HALOPERIDOL 5 MG PO TABS
5.0000 mg | ORAL_TABLET | Freq: Every morning | ORAL | Status: DC
  Administered 2013-11-20: 5 mg via ORAL
  Filled 2013-11-20: qty 1

## 2013-11-20 MED ORDER — LITHIUM CARBONATE 300 MG PO CAPS: 600.0000 mg | ORAL_CAPSULE | Freq: Two times a day (BID) | ORAL | Status: DC

## 2013-11-20 MED ORDER — HALOPERIDOL LACTATE 5 MG/ML IJ SOLN
INTRAMUSCULAR | Status: AC
  Administered 2013-11-20: 5 mg via INTRAMUSCULAR
  Filled 2013-11-20: qty 1

## 2013-11-20 MED ORDER — HALOPERIDOL 5 MG PO TABS: 10.0000 mg | ORAL_TABLET | Freq: Every evening | ORAL | Status: DC

## 2013-11-20 MED ORDER — LORAZEPAM 1 MG PO TABS
1.0000 mg | ORAL_TABLET | Freq: Three times a day (TID) | ORAL | Status: DC | PRN
  Filled 2013-11-20: qty 1

## 2013-11-20 MED ORDER — LORAZEPAM 2 MG/ML IJ SOLN
2.0000 mg | Freq: Once | INTRAMUSCULAR | Status: AC
  Administered 2013-11-20: 2 mg via INTRAVENOUS
  Filled 2013-11-20: qty 1

## 2013-11-20 MED ORDER — SODIUM CHLORIDE 0.9 % IV BOLUS (SEPSIS)
1000.0000 mL | Freq: Once | INTRAVENOUS | Status: AC
  Administered 2013-11-20: 1000 mL via INTRAVENOUS

## 2013-11-20 MED ORDER — ACETAMINOPHEN 325 MG PO TABS: 650.0000 mg | ORAL_TABLET | ORAL | Status: DC | PRN

## 2013-11-20 MED ORDER — HALOPERIDOL LACTATE 5 MG/ML IJ SOLN
5.0000 mg | Freq: Four times a day (QID) | INTRAMUSCULAR | Status: DC | PRN
  Administered 2013-11-20: 5 mg via INTRAMUSCULAR

## 2013-11-20 MED ORDER — ONDANSETRON HCL 4 MG PO TABS: 4.0000 mg | ORAL_TABLET | Freq: Three times a day (TID) | ORAL | Status: DC | PRN

## 2013-11-20 NOTE — ED Notes (Signed)
Patient admitted to room 35 from [facility]. Patient with history of mental illness, panic attacks, ADD, Depression, and Hepatitis C. Upon arrival, patient resting quietly in room, however, at present, patient alert and occasionally walking about hallways. Patient currently denies SI/HI. Patient is experiencing both auditory and visual hallucinations. Patient states, "[place] are [place]?, Do you hear them talking?, Am I the only one seeing things here?" Patient affect and mood are anxious. Verbalizations are loud and rapid and pressured at times. Patient refused cogentin and haldol; Dr. Ladona Ridgelaylor advised.

## 2013-11-20 NOTE — ED Notes (Signed)
Bed: NF62WA05 Expected date:  Expected time:  Means of arrival:  Comments: EMS- Hallucinations, medical Clearance

## 2013-11-20 NOTE — ED Notes (Signed)
Pt taken to restroom to get sample upon arrival. Pt talking in circles, not making sense. [employer] at door.

## 2013-11-20 NOTE — ED Notes (Signed)
Per EMS pt called because she was not felling well. Upon assessment pt has flight of ideas and hallucinations; "there is someone in the back of the truck." Pt excessively loud but follows commands. Pt hx of mental illness.
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
## 2013-11-20 NOTE — ED Notes (Signed)
[facility] 1050. Patient denied SI/HI, but was positive for auditory and visual hallucinations. IVC papers were served to patient on unit due to patient status.  Patient became increasingly agitated with loud verbalizations and need for constant redirection. At 1350, patient received 25 mg IM Benadryl, 1 mg IM Ativan, and 5 mg IM Haldol. Since that time, patient has been sleeping in patient room. [facility] as patient will be transferring to [phone] after she awakens.

## 2013-11-20 NOTE — ED Notes (Signed)
Bel[facility].

## 2013-11-20 NOTE — ED Notes (Signed)
Unsuccessful IV x2 by this RN. Lanae CrumblyN Davis at  Bedside attempting IV.
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
## 2013-11-20 NOTE — ED Notes (Signed)
Pt belongings taken out of room and put in locker 27. Bag consists of silver necklace, black flip flops, black pants, blue bra, brown shirt, tan coat.

## 2013-11-20 NOTE — ED Notes (Signed)
Pt continues to converse with staff "Where is the queen? I am not going to that [place]. I am seeing cotton all over. He can get his own cotton."
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
## 2013-11-21 ENCOUNTER — Encounter (HOSPITAL_COMMUNITY): Payer: Self-pay

## 2013-11-21 ENCOUNTER — Inpatient Hospital Stay (HOSPITAL_COMMUNITY)
Admission: AD | Admit: 2013-11-21 | Discharge: 2013-11-26 | DRG: 885 | Disposition: A | Discharge status: Home / Self Care | Payer: 59 | Source: Intra-hospital | Attending: Psychiatry | Admitting: Psychiatry

## 2013-11-21 DIAGNOSIS — F1110 Opioid abuse, uncomplicated: Secondary | ICD-10-CM | POA: Diagnosis present

## 2013-11-21 DIAGNOSIS — F3160 Bipolar disorder, current episode mixed, unspecified: Secondary | ICD-10-CM

## 2013-11-21 DIAGNOSIS — Z9114 Patient's other noncompliance with medication regimen: Secondary | ICD-10-CM | POA: Diagnosis present

## 2013-11-21 DIAGNOSIS — F1010 Alcohol abuse, uncomplicated: Secondary | ICD-10-CM | POA: Diagnosis present

## 2013-11-21 DIAGNOSIS — F17210 Nicotine dependence, cigarettes, uncomplicated: Secondary | ICD-10-CM | POA: Diagnosis present

## 2013-11-21 DIAGNOSIS — F1390 Sedative, hypnotic, or anxiolytic use, unspecified, uncomplicated: Secondary | ICD-10-CM

## 2013-11-21 DIAGNOSIS — F1510 Other stimulant abuse, uncomplicated: Secondary | ICD-10-CM | POA: Diagnosis present

## 2013-11-21 DIAGNOSIS — F1210 Cannabis abuse, uncomplicated: Secondary | ICD-10-CM

## 2013-11-21 DIAGNOSIS — F312 Bipolar disorder, current episode manic severe with psychotic features: Secondary | ICD-10-CM

## 2013-11-21 DIAGNOSIS — F309 Manic episode, unspecified: Secondary | ICD-10-CM | POA: Diagnosis present

## 2013-11-21 DIAGNOSIS — F1520 Other stimulant dependence, uncomplicated: Secondary | ICD-10-CM

## 2013-11-21 DIAGNOSIS — F1410 Cocaine abuse, uncomplicated: Secondary | ICD-10-CM | POA: Diagnosis present

## 2013-11-21 DIAGNOSIS — F410 Panic disorder [episodic paroxysmal anxiety] without agoraphobia: Secondary | ICD-10-CM | POA: Diagnosis present

## 2013-11-21 DIAGNOSIS — F1910 Other psychoactive substance abuse, uncomplicated: Secondary | ICD-10-CM

## 2013-11-21 DIAGNOSIS — Z8249 Family history of ischemic heart disease and other diseases of the circulatory system: Secondary | ICD-10-CM

## 2013-11-21 DIAGNOSIS — F29 Unspecified psychosis not due to a substance or known physiological condition: Secondary | ICD-10-CM | POA: Diagnosis present

## 2013-11-21 DIAGNOSIS — R45851 Suicidal ideations: Secondary | ICD-10-CM | POA: Diagnosis present

## 2013-11-21 DIAGNOSIS — F1590 Other stimulant use, unspecified, uncomplicated: Secondary | ICD-10-CM

## 2013-11-21 DIAGNOSIS — Z833 Family history of diabetes mellitus: Secondary | ICD-10-CM

## 2013-11-21 DIAGNOSIS — B1920 Unspecified viral hepatitis C without hepatic coma: Secondary | ICD-10-CM | POA: Diagnosis present

## 2013-11-21 DIAGNOSIS — F1320 Sedative, hypnotic or anxiolytic dependence, uncomplicated: Secondary | ICD-10-CM

## 2013-11-21 DIAGNOSIS — F1420 Cocaine dependence, uncomplicated: Secondary | ICD-10-CM

## 2013-11-21 MED ORDER — HALOPERIDOL 5 MG PO TABS
5.0000 mg | ORAL_TABLET | Freq: Four times a day (QID) | ORAL | Status: DC | PRN
  Administered 2013-11-24: 5 mg via ORAL
  Filled 2013-11-21: qty 1

## 2013-11-21 MED ORDER — HYDROXYZINE HCL 25 MG PO TABS: 25.0000 mg | ORAL_TABLET | Freq: Three times a day (TID) | ORAL | Status: DC | PRN

## 2013-11-21 MED ORDER — ONDANSETRON 4 MG PO TBDP: 4.0000 mg | ORAL_TABLET | Freq: Four times a day (QID) | ORAL | Status: AC | PRN

## 2013-11-21 MED ORDER — HALOPERIDOL LACTATE 5 MG/ML IJ SOLN
INTRAMUSCULAR | Status: AC
  Administered 2013-11-21: 5 mg via INTRAMUSCULAR
  Filled 2013-11-21: qty 1

## 2013-11-21 MED ORDER — LORAZEPAM 2 MG/ML IJ SOLN
INTRAMUSCULAR | Status: AC
  Administered 2013-11-21: 2 mg via INTRAMUSCULAR
  Filled 2013-11-21: qty 1

## 2013-11-21 MED ORDER — DIPHENHYDRAMINE HCL 25 MG PO CAPS: 50.0000 mg | ORAL_CAPSULE | Freq: Four times a day (QID) | ORAL | Status: DC | PRN

## 2013-11-21 MED ORDER — NICOTINE 21 MG/24HR TD PT24
21.0000 mg | MEDICATED_PATCH | Freq: Every day | TRANSDERMAL | Status: DC
  Administered 2013-11-24 – 2013-11-25 (×2): 21 mg via TRANSDERMAL
  Filled 2013-11-21 (×5): qty 1

## 2013-11-21 MED ORDER — ADULT MULTIVITAMIN W/MINERALS CH
1.0000 | ORAL_TABLET | Freq: Every day | ORAL | Status: DC
  Administered 2013-11-23 – 2013-11-26 (×4): 1 via ORAL
  Filled 2013-11-21 (×8): qty 1

## 2013-11-21 MED ORDER — NICOTINE 21 MG/24HR TD PT24
21.0000 mg | MEDICATED_PATCH | Freq: Every day | TRANSDERMAL | Status: DC
  Filled 2013-11-21: qty 1

## 2013-11-21 MED ORDER — MAGNESIUM HYDROXIDE 400 MG/5ML PO SUSP: 30.0000 mL | Freq: Every day | ORAL | Status: DC | PRN

## 2013-11-21 MED ORDER — DIPHENHYDRAMINE HCL 50 MG/ML IJ SOLN
50.0000 mg | Freq: Four times a day (QID) | INTRAMUSCULAR | Status: DC | PRN
  Administered 2013-11-22: 50 mg via INTRAMUSCULAR

## 2013-11-21 MED ORDER — LITHIUM CARBONATE 300 MG PO CAPS
600.0000 mg | ORAL_CAPSULE | Freq: Two times a day (BID) | ORAL | Status: DC
  Administered 2013-11-23 – 2013-11-26 (×7): 600 mg via ORAL
  Filled 2013-11-21 (×14): qty 2

## 2013-11-21 MED ORDER — HYDROXYZINE HCL 25 MG PO TABS
25.0000 mg | ORAL_TABLET | ORAL | Status: DC | PRN
  Administered 2013-11-22 – 2013-11-23 (×2): 25 mg via ORAL
  Filled 2013-11-21 (×2): qty 1

## 2013-11-21 MED ORDER — HYDROXYZINE HCL 25 MG PO TABS
25.0000 mg | ORAL_TABLET | Freq: Three times a day (TID) | ORAL | Status: DC | PRN
  Filled 2013-11-21: qty 10

## 2013-11-21 MED ORDER — LORAZEPAM 2 MG/ML IJ SOLN
2.0000 mg | Freq: Once | INTRAMUSCULAR | Status: AC
  Administered 2013-11-21 – 2013-11-22 (×2): 2 mg via INTRAMUSCULAR

## 2013-11-21 MED ORDER — LOPERAMIDE HCL 2 MG PO CAPS: 2.0000 mg | ORAL_CAPSULE | ORAL | Status: AC | PRN

## 2013-11-21 MED ORDER — BENZTROPINE MESYLATE 1 MG PO TABS
1.0000 mg | ORAL_TABLET | Freq: Two times a day (BID) | ORAL | Status: DC
  Filled 2013-11-21 (×6): qty 1

## 2013-11-21 MED ORDER — NICOTINE 21 MG/24HR TD PT24
21.0000 mg | MEDICATED_PATCH | Freq: Once | TRANSDERMAL | Status: DC
  Filled 2013-11-21: qty 1

## 2013-11-21 MED ORDER — BENZTROPINE MESYLATE 1 MG PO TABS
1.0000 mg | ORAL_TABLET | Freq: Two times a day (BID) | ORAL | Status: DC
  Administered 2013-11-23 – 2013-11-26 (×7): 1 mg via ORAL
  Filled 2013-11-21 (×12): qty 1

## 2013-11-21 MED ORDER — VITAMIN B-1 100 MG PO TABS
100.0000 mg | ORAL_TABLET | Freq: Every day | ORAL | Status: DC
  Administered 2013-11-23 – 2013-11-26 (×4): 100 mg via ORAL
  Filled 2013-11-21 (×6): qty 1

## 2013-11-21 MED ORDER — ALUM & MAG HYDROXIDE-SIMETH 200-200-20 MG/5ML PO SUSP: 30.0000 mL | ORAL | Status: DC | PRN

## 2013-11-21 MED ORDER — HALOPERIDOL LACTATE 5 MG/ML IJ SOLN
5.0000 mg | Freq: Four times a day (QID) | INTRAMUSCULAR | Status: DC | PRN
  Administered 2013-11-21: 5 mg via INTRAMUSCULAR
  Filled 2013-11-21: qty 1

## 2013-11-21 MED ORDER — HALOPERIDOL LACTATE 5 MG/ML IJ SOLN
5.0000 mg | Freq: Four times a day (QID) | INTRAMUSCULAR | Status: DC | PRN
  Administered 2013-11-21: 5 mg via INTRAMUSCULAR
  Filled 2013-11-21 (×2): qty 1

## 2013-11-21 MED ORDER — HALOPERIDOL 5 MG PO TABS
5.0000 mg | ORAL_TABLET | Freq: Two times a day (BID) | ORAL | Status: DC
  Administered 2013-11-23 – 2013-11-24 (×3): 5 mg via ORAL
  Filled 2013-11-21 (×8): qty 1

## 2013-11-21 MED ORDER — HALOPERIDOL 5 MG PO TABS
10.0000 mg | ORAL_TABLET | Freq: Every evening | ORAL | Status: DC
  Administered 2013-11-23: 10 mg via ORAL
  Filled 2013-11-21 (×4): qty 2

## 2013-11-21 MED ORDER — ACETAMINOPHEN 325 MG PO TABS
650.0000 mg | ORAL_TABLET | Freq: Four times a day (QID) | ORAL | Status: DC | PRN
Start: 2013-11-21 — End: 2013-11-26

## 2013-11-21 MED ORDER — HALOPERIDOL 5 MG PO TABS
5.0000 mg | ORAL_TABLET | Freq: Every morning | ORAL | Status: DC
  Administered 2013-11-23: 5 mg via ORAL
  Filled 2013-11-21 (×6): qty 1

## 2013-11-21 MED ORDER — LORAZEPAM 1 MG PO TABS
1.0000 mg | ORAL_TABLET | Freq: Four times a day (QID) | ORAL | Status: AC | PRN
  Administered 2013-11-22 – 2013-11-24 (×3): 1 mg via ORAL
  Filled 2013-11-21 (×3): qty 1

## 2013-11-21 MED ORDER — TRAZODONE HCL 100 MG PO TABS
100.0000 mg | ORAL_TABLET | Freq: Every day | ORAL | Status: DC
  Administered 2013-11-24 – 2013-11-25 (×3): 100 mg via ORAL
  Filled 2013-11-21 (×6): qty 1

## 2013-11-21 MED ORDER — DIPHENHYDRAMINE HCL 50 MG/ML IJ SOLN
50.0000 mg | Freq: Four times a day (QID) | INTRAMUSCULAR | Status: DC | PRN
  Administered 2013-11-22: 50 mg via INTRAMUSCULAR
  Filled 2013-11-21 (×3): qty 1

## 2013-11-21 MED ORDER — THIAMINE HCL 100 MG/ML IJ SOLN: 100.0000 mg | Freq: Once | INTRAMUSCULAR | Status: DC

## 2013-11-21 MED ORDER — HALOPERIDOL LACTATE 5 MG/ML IJ SOLN: 5.0000 mg | Freq: Once | INTRAMUSCULAR | Status: AC
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
## 2013-11-21 NOTE — ED Notes (Signed)
Patient agitated. Patient states [facility] States that she sees ghosts under her house and she is not crazy and can make her own decisions.

## 2013-11-21 NOTE — [facility] Group Notes (Signed)
[facility]   11/21/2013 9:18 AM  Participation Quality:  Invited. Did not attend.    Hyatt,Candace

## 2013-11-21 NOTE — Progress Notes (Signed)
Admission Note:   D:33 yr female who presents IVC in no acute distress for the treatment of Psychosis and bizarre behavior . Pt presents agitated and argumentative, and rude.  Pt denies SI/HI/AVH . Pt stated she was experiencing Paranormal behaviors at her grandmothers house (seeing and hearing things under her house), but she was not "crazy". Pt blames everyone, but herself for the problems that are going on in her life. Pt was very irritated during admission and stated writer treated her bad. Pt stated "I am not here to be interrogated". Pt very argumentative with Clinical research associatewriter.   A:Skin was assessed and found to be clear of any abnormal marks apart from a scars on Ll outer leg, Rl inner leg. POC and unit policies explained and understanding verbalized. Consents obtained. Food and fluids offered, and fluids accepted.  R: Pt had no additional questions or concerns.
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
## 2013-11-21 NOTE — Progress Notes (Signed)
[facility] Group Notes:  (Nursing/MHT/Case Management/Adjunct)  Date:  11/21/2013  Time:  1:45 PM  Type of Therapy:  Therapeutic Activity  Participation Level:  Did Not Attend   Caswell CorwinOwen, Davien Malone C 11/21/2013, 1:45 PM
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
## 2013-11-21 NOTE — Progress Notes (Signed)
Patient has continued to sleep without complaint. No further outbursts. Meds held to allow patient to rest. She remains safe at present. Lawrence MarseillesFriedman, Malicia Blasdel Eakes

## 2013-11-21 NOTE — Tx Team (Signed)
Interdisciplinary Treatment Plan Update (Adult)  Date: 11/21/2013 Time Reviewed: 8:20 AM  Progress in Treatment:  Attending groups: No Participating in groups: No  Taking medication as prescribed: Yes  Tolerating medication: Yes  Family/Significant other contact made:No Patient understands diagnosis: No  LImited insight Discussing patient identified problems/goals with staff: Yes See initial care plan Medical problems stabilized or resolved: Yes  Denies suicidal/homicidal ideation: . Yes In tx team Patient has not harmed self or Others: Yes  New problem(s) identified:  Discharge Plan or Barriers: return home, follow up outpt Additional comments:   Reason for Continuation of Hospitalization: Medication management, paranoia, disorganized thinking, mood lability Estimated length of stay: 4-5 days   Attendees:  Patient:  11/21/2013 8:20 AM   Family:  10/16/20158:20 AM   Physician: Dr. Elna BreslowEappen MD  10/16/20158:20 AM  Nursing: Merian CapronMarian Friedman, RN 11/21/2013 8:20 AM  Clinical Social Worker: Daryel Geraldodney Shabnam Ladd, LCSW  10/16/20158:20 AM  Clinical Social Worker: Charleston Ropesandace Hyatt, CSW Intern 10/16/20158:20 AM  Other:  10/16/20158:20 AM  Other:  10/16/20158:20 AM  Other:  10/16/20158:20 AM  Scribe for Treatment Team:  The ServiceMaster CompanyCandace Hyatt, CSW Intern 11/21/2013 8:20 AM

## 2013-11-21 NOTE — [facility] Suicide Risk Assessment (Signed)
   Nursing information obtained from:    Demographic factors:    Current Mental Status:    Loss Factors:    Historical Factors:    Risk Reduction Factors:    Total Time spent with patient: 30 minutes  CLINICAL FACTORS:   Unstable or Poor Therapeutic Relationship Previous Psychiatric Diagnoses and Treatments  Psychiatric Specialty Exam: Physical Exam Please see H&P.   ROS Please see H&P.   Blood pressure 106/86, pulse 91, temperature 98.4 F (36.9 C), temperature source Oral, resp. rate 18, height 5\' 2"  (1.575 m), weight 58.968 kg (130 lb), last menstrual period 10/22/2013.Body mass index is 23.77 kg/(m^2).   Please see H&P FOR MSE.     SUICIDE RISK:   Moderate:  Frequent suicidal ideation with limited intensity, and duration, some specificity in terms of plans, no associated intent, good self-control, limited dysphoria/symptomatology, some risk factors present, and identifiable protective factors, including available and accessible social support.  PLAN OF CARE:Please see H&P.   I certify that inpatient services furnished can reasonably be expected to improve the patient's condition.  Lavoris Canizales md 11/21/2013, 1:22 PM

## 2013-11-21 NOTE — ED Notes (Signed)
Patient denies SI, HI, AVH at present. States "I'm not going to there" when [facility].   Q 15 safety checks continue.

## 2013-11-21 NOTE — [facility] Group Notes (Signed)
Adult Psychoeducational Group Note  Date:  11/21/2013 Time:  9:46 PM  Group Topic/Focus:  Wrap-Up Group:   The focus of this group is to help patients review their daily goal of treatment and discuss progress on daily workbooks.  Participation Level:  Did Not Attend  Participation Quality:  None  Affect:  None  Cognitive:  None  Insight: None  Engagement in Group:  None  Modes of Intervention:  Discussion  Additional Comments:  Mayana did not attend group.  Caroll RancherLindsay, Abou Sterkel A 11/21/2013, 9:46 PM

## 2013-11-21 NOTE — Progress Notes (Signed)
Patient has been sleeping since admin of emergency IM medications. Patient awoke briefly to speak with CM. Order had been received for EKG to be completed Saturday however this writer asked patient to complete EKG now. Patient refused stating, "I just need to go back to sleep." Will pass on in shift report. Lawrence MarseillesFriedman, Norris Brumbach Eakes

## 2013-11-22 DIAGNOSIS — R45851 Suicidal ideations: Secondary | ICD-10-CM

## 2013-11-22 DIAGNOSIS — F1914 Other psychoactive substance abuse with psychoactive substance-induced mood disorder: Secondary | ICD-10-CM

## 2013-11-22 DIAGNOSIS — F319 Bipolar disorder, unspecified: Secondary | ICD-10-CM

## 2013-11-22 LAB — LITHIUM LEVEL: Lithium Lvl: <0.25 mEq/L — ABNORMAL LOW (ref 0.80–1.40)

## 2013-11-22 LAB — LIPID PANEL
Cholesterol: 182 mg/dL (ref 0–200)
HDL: 52 mg/dL (ref >39)
LDL CALC: 104 mg/dL — AB (ref 0–99)
TRIGLYCERIDES: 131 mg/dL (ref <150)
Total CHOL/HDL Ratio: 3.5 RATIO
VLDL: 26 mg/dL (ref 0–40)

## 2013-11-22 LAB — HEMOGLOBIN A1C
Hgb A1c MFr Bld: 5.5 % (ref <5.7)
Mean Plasma Glucose: 111 mg/dL (ref <117)

## 2013-11-22 MED ORDER — OLANZAPINE 10 MG PO TBDP
10.0000 mg | ORAL_TABLET | Freq: Once | ORAL | Status: AC
  Administered 2013-11-22: 10 mg via ORAL
  Filled 2013-11-22: qty 1

## 2013-11-22 MED ORDER — DIPHENHYDRAMINE HCL 50 MG/ML IJ SOLN
INTRAMUSCULAR | Status: AC
  Administered 2013-11-22: 50 mg via INTRAMUSCULAR
  Filled 2013-11-22: qty 1

## 2013-11-22 MED ORDER — OLANZAPINE 10 MG PO TBDP
ORAL_TABLET | ORAL | Status: AC
  Administered 2013-11-22: 10 mg via ORAL
  Filled 2013-11-22: qty 1

## 2013-11-22 MED ORDER — LORAZEPAM 2 MG/ML IJ SOLN
INTRAMUSCULAR | Status: AC
  Administered 2013-11-22: 2 mg via INTRAMUSCULAR
  Filled 2013-11-22: qty 1

## 2013-11-22 MED ORDER — DIPHENHYDRAMINE HCL 50 MG/ML IJ SOLN
50.0000 mg | Freq: Once | INTRAMUSCULAR | Status: DC
  Filled 2013-11-22: qty 1

## 2013-11-22 MED ORDER — HALOPERIDOL LACTATE 5 MG/ML IJ SOLN
5.0000 mg | Freq: Once | INTRAMUSCULAR | Status: AC
  Administered 2013-11-22: 5 mg via INTRAMUSCULAR

## 2013-11-22 MED ORDER — HALOPERIDOL LACTATE 5 MG/ML IJ SOLN
INTRAMUSCULAR | Status: AC
  Filled 2013-11-22: qty 2

## 2013-11-22 MED ORDER — LORAZEPAM 2 MG/ML IJ SOLN
2.0000 mg | Freq: Once | INTRAMUSCULAR | Status: AC
  Administered 2013-11-22: 2 mg via INTRAMUSCULAR

## 2013-11-22 NOTE — Progress Notes (Signed)
Patient ID: [name], female   DOB: [birth year], 33 y.o.   MRN: [id number] D: Patient irritable and loud this morning.  She presented to medication window sobbing, "when is the doctor going to get here?" She has pressured, rapid and loud speech.  She is agitated and anxious.  Patient refused all medication this morning.  She states, "I'm getting out of here today.  Tonight is my daughter's birthday.  No amount of your medication is going to help me.  I'm not going to sleep all day. You're stupid if you think I'm going to take anything."  Informed patient that an injection may be necessary if she refuses po medications.  Cannot assess for SI/HI/AVH as patient is not cooperative at this time. A: Educated patient on the importance of taking her medications.  Safety checks completed every 15 minutes per protocol.  Encourage and redirect patient as needed. R: Patient is isolating in her room.  She is not receptive to any of staff's information.

## 2013-11-22 NOTE — Progress Notes (Signed)
Patient woke up this mane crying hysterically. Writer could not understand what she was saying. Talking incoherently and making no sense. Offered and accepted PO PRN of Vistaril 25 mg for anxiety. Reassured. Support and encouragement offered. Will continue to monitor patient.

## 2013-11-22 NOTE — Progress Notes (Signed)
Patient ID: [name], female   DOB: 02-Mar-1980, 33 y.o.   MRN: [id number] D: Patient agitated and pacing the hallway. She got up and left group after becoming upset over, "not getting any advice for how to get out of here."  She spoke with doctor and she became abusive verbally.  [facility] and it is the understanding that she needs to take her medication.  She continues to refuse her po meds.  MD ordered one time dose of zyprexa 10 mg which she agreed to take.  An injection of 5 mg haldol and 50 mg benedryl was drawn up for patient.   She states, "I don't be doped up all day.  I have to go to my daughter's birthday party tonight."  Patient is loud, uncooperative and agitated.  Her speech is disorganized and tangental.  She reluctantly agreed to take the injection. Patient was given gatorade and snack per her request. Patient resting quietly at this time. A: Continue to assess patient and redirect as necessary. R: Patient remains subdued and quiet.

## 2013-11-22 NOTE — Progress Notes (Signed)
Psychoeducational Group Note  Date:  11/22/2013 Time:  2347  Group Topic/Focus:  Wrap-Up Group:   The focus of this group is to help patients review their daily goal of treatment and discuss progress on daily workbooks.  Participation Level: Did Not Attend  Participation Quality:  Not Applicable  Affect:  Not Applicable  Cognitive:  Not Applicable  Insight:  Not Applicable  Engagement in Group: Not Applicable  Additional Comments:  The patient did not attend group since she was asleep in her bedroom.   Hazle CocaGOODMAN, Nysia Dell S 11/22/2013, 11:47 PM
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
## 2013-11-22 NOTE — [facility] Group Notes (Signed)
[facility] Group Notes: (Clinical Social Work)   11/22/2013      Type of Therapy:  Group Therapy   Participation Level:  Did Not Attend - seen in hall, very labile and agitated, not appropriate for group   Ambrose MantleMareida Grossman-Orr, LCSW 11/22/2013, 1:29 PM

## 2013-11-22 NOTE — Progress Notes (Signed)
[facility] MD Progress Note  11/22/2013 1:00 PM Leslie [name]  MRN:  [id number] Subjective:  Patient is seen and chart reviewed. Patient has been irritable, agitated, racing thoughts, pressured speech, disorganized, flight of ideations and loosening of associations, goal directed activity, increased energy, demanding to be discharged with poor insight, judgment and poor impulse control. She states that she is going to refuse all her medication until she will be discharged today because she needs to attend her four years old daughter birthday and also reported DSS/CPS involvement due to drug abuse especially adderall. She walked close to the locked doors and been chaotic on the unit. She was taken Zyprexa Zydis 10 mg, haldol 5 mg IM and benadryl 50 mg IM to calm down this morning. Lithium level is 0.25 which is sub therapeutic due to non compliance with medications.   She has history of bipolar disorder as well as significant substance abuse (cocaine,alcohol,amphetamines ,valium,opioids).  Patient currently is manic with flight of ideas as well as pressured speech. Patient recently got a script for amphetamines as well as valium from an outpatient provider and was abusing it ,but recently ran out of it.  Diagnosis:   DSM5: Schizophrenia Disorders:   Obsessive-Compulsive Disorders:   Trauma-Stressor Disorders:   Substance/Addictive Disorders:  Alcohol Related Disorder - Moderate (303.90) Depressive Disorders:  Disruptive Mood Dysregulation Disorder (296.99) Total Time spent with patient: 30 minutes  Axis I: Bipolar, Manic, Substance Induced Mood Disorder and Polysubstance abuse  ADL's:  Impaired  Sleep: Poor  Appetite:  Poor  Suicidal Ideation:  She is manic, agitated and try to ran out the locked psych unit but does not endorse suicidal ideations today Homicidal Ideation:  denied AEB (as evidenced by):  Psychiatric Specialty Exam: Physical Exam  ROS  Blood pressure 106/86, pulse 91,  temperature 98.4 F (36.9 C), temperature source Oral, resp. rate 18, height 5\' 2"  (1.575 m), weight 58.968 kg (130 lb), last menstrual period 10/22/2013.Body mass index is 23.77 kg/(m^2).  General Appearance: Disheveled and Guarded  Eye Contact::  Good  Speech:  Pressured  Volume:  Increased  Mood:  Angry, Euphoric and Irritable  Affect:  Congruent and Labile  Thought Process:  Circumstantial, Disorganized, Irrelevant, Loose and Tangential  Orientation:  Full (Time, Place, and Person)  Thought Content:  Obsessions and Rumination  Suicidal Thoughts:  Yes.  without intent/plan  Homicidal Thoughts:  No  Memory:  Immediate;   Fair Recent;   Fair  Judgement:  Impaired  Insight:  Lacking  Psychomotor Activity:  Increased and Restlessness  Concentration:  Poor  Recall:  Poor  Fund of Knowledge:Fair  Language: Good  Akathisia:  NA  Handed:  Right  AIMS (if indicated):     Assets:  Architect Housing Intimacy Physical Health Resilience Social Support Talents/Skills Transportation  Sleep:  Number of Hours: 2.5   Musculoskeletal: Strength & Muscle Tone: within normal limits Gait & Station: normal Patient leans: N/A  Current Medications: Current Facility-Administered Medications  Medication Dose Route Frequency Provider Last Rate Last Dose  . acetaminophen (TYLENOL) tablet 650 mg  650 mg Oral Q6H PRN Nanine Means, NP      . alum & mag hydroxide-simeth (MAALOX/MYLANTA) 200-200-20 MG/5ML suspension 30 mL  30 mL Oral Q4H PRN Nanine Means, NP      . benztropine (COGENTIN) tablet 1 mg  1 mg Oral BID Nanine Means, NP      . haloperidol (HALDOL) tablet 5 mg  5 mg Oral Q6H PRN Saramma  Eappen, MD       And  . diphenhydrAMINE (BENADRYL) capsule 50 mg  50 mg Oral Q6H PRN Jomarie LongsSaramma Eappen, MD      . haloperidol lactate (HALDOL) injection 5 mg  5 mg Intramuscular Q6H PRN Jomarie LongsSaramma Eappen, MD   5 mg at 11/21/13 1043   And  . diphenhydrAMINE (BENADRYL)  injection 50 mg  50 mg Intramuscular Q6H PRN Jomarie LongsSaramma Eappen, MD   50 mg at 11/22/13 1030  . diphenhydrAMINE (BENADRYL) injection 50 mg  50 mg Intramuscular Once Fransisca KaufmannLaura Davis, NP      . haloperidol (HALDOL) tablet 10 mg  10 mg Oral QPM Shuvon Rankin, NP      . haloperidol (HALDOL) tablet 5 mg  5 mg Oral q morning - 10a Shuvon Rankin, NP      . haloperidol (HALDOL) tablet 5 mg  5 mg Oral BID Nanine MeansJamison Lord, NP      . hydrOXYzine (ATARAX/VISTARIL) tablet 25 mg  25 mg Oral Q4H PRN Nanine MeansJamison Lord, NP   25 mg at 11/22/13 [id number]  . [START ON 11/27/2013] hydrOXYzine (ATARAX/VISTARIL) tablet 25 mg  25 mg Oral Q8H PRN Saramma Eappen, MD      . lithium carbonate capsule 600 mg  600 mg Oral BID WC Shuvon Rankin, NP      . loperamide (IMODIUM) capsule 2-4 mg  2-4 mg Oral PRN Jomarie LongsSaramma Eappen, MD      . LORazepam (ATIVAN) injection 2 mg  2 mg Intramuscular Once Fransisca KaufmannLaura Davis, NP      . LORazepam (ATIVAN) tablet 1 mg  1 mg Oral Q6H PRN Jomarie LongsSaramma Eappen, MD   1 mg at 11/22/13 0854  . magnesium hydroxide (MILK OF MAGNESIA) suspension 30 mL  30 mL Oral Daily PRN Shuvon Rankin, NP      . multivitamin with minerals tablet 1 tablet  1 tablet Oral Daily Saramma Eappen, MD      . nicotine (NICODERM CQ - dosed in mg/24 hours) patch 21 mg  21 mg Transdermal Once Nanine MeansJamison Lord, NP      . nicotine (NICODERM CQ - dosed in mg/24 hours) patch 21 mg  21 mg Transdermal Q2000 Saramma Eappen, MD      . ondansetron (ZOFRAN-ODT) disintegrating tablet 4 mg  4 mg Oral Q6H PRN Saramma Eappen, MD      . thiamine (B-1) injection 100 mg  100 mg Intramuscular Once Jomarie LongsSaramma Eappen, MD      . thiamine (VITAMIN B-1) tablet 100 mg  100 mg Oral Daily Saramma Eappen, MD      . traZODone (DESYREL) tablet 100 mg  100 mg Oral QHS Shuvon Rankin, NP        Lab Results:  Results for orders placed during the hospital encounter of 11/21/13 (from the past 48 hour(s))  LIPID PANEL     Status: Abnormal   Collection Time    11/22/13  6:30 AM      Result Value Ref  Range   Cholesterol 182  0 - 200 mg/dL   Triglycerides 960131  <454<150 mg/dL   HDL 52  >09>39 mg/dL   Total CHOL/HDL Ratio 3.5     VLDL 26  0 - 40 mg/dL   LDL Cholesterol 811104 (*) 0 - 99 mg/dL   Comment:            Total Cholesterol/HDL:CHD Risk     Coronary Heart Disease Risk Table  Men   Women      1/2 Average Risk   3.4   3.3      Average Risk       5.0   4.4      2 X Average Risk   9.6   7.1      3 X Average Risk  23.4   11.0                Use the calculated Patient Ratio     above and the CHD Risk Table     to determine the patient's CHD Risk.                ATP III CLASSIFICATION (LDL):      <100     mg/dL   Optimal      161-096100-129  mg/dL   Near or Above                        Optimal      130-159  mg/dL   Borderline      [id number]  mg/dL   High      >811>190     mg/dL   Very High     [facility]  LITHIUM LEVEL     Status: Abnormal   Collection Time    11/22/13  6:30 AM      Result Value Ref Range   Lithium Lvl <0.25 (*) 0.80 - 1.40 mEq/L   Comment: [facility]    Physical Findings: AIMS: Facial and Oral Movements Muscles of Facial Expression: None, normal Lips and Perioral Area: None, normal Jaw: None, normal Tongue: None, normal,Extremity Movements Upper (arms, wrists, hands, fingers): None, normal Lower (legs, knees, ankles, toes): None, normal, Trunk Movements Neck, shoulders, hips: None, normal, Overall Severity Severity of abnormal movements (highest score from questions above): None, normal Incapacitation due to abnormal movements: None, normal Patient's awareness of abnormal movements (rate only patient's report): No Awareness, Dental Status Current problems with teeth and/or dentures?: No Does patient usually wear dentures?: No  CIWA:  CIWA-Ar Total: 6 COWS:     Treatment Plan Summary: Daily contact with patient to assess and evaluate symptoms and progress in treatment Medication  management  Plan: Patient needs forced medication due to severe manic symptoms, poor insight, judgment and impulse control, irritabiliy, agitated and can be aggressive and dangerous to other people and disturbing the unit milieu. Will obtain second opinion from another psychiatrist. Given haldol and benadryl IM once and zyprexa zydis 10mg  once Encourage to be compliant with medication and group therapies Try to contact her aunt without success Continue close monitoring  Medical Decision Making Problem Points:  Established problem, worsening (2), New problem, with no additional work-up planned (3), Review of last therapy session (1), Review of psycho-social stressors (1) and Self-limited or minor (1) Data Points:  Review or order clinical lab tests (1) Review or order medicine tests (1) Review of medication regiment & side effects (2) Review of new medications or change in dosage (2)  I certify that inpatient services furnished can reasonably be expected to improve the patient's condition.   Daimen Shovlin,JANARDHAHA R. 11/22/2013, 1:00 PM

## 2013-11-22 NOTE — Progress Notes (Signed)
Patient was met lying the in bed with eyes closed. Patient too sleepy and drowsy. Could not wake up for her medications. At 1 am, pt was observed,  eyes open and could only answer few questions and fell back asleep. Patient in constant monitoring and observation. Every 15 minutes check for safety. Will continue to monitor.

## 2013-11-23 MED ORDER — LORAZEPAM 2 MG/ML IJ SOLN: 2.0000 mg | Freq: Once | INTRAMUSCULAR | Status: DC

## 2013-11-23 MED ORDER — OLANZAPINE 10 MG PO TBDP
ORAL_TABLET | ORAL | Status: AC
  Filled 2013-11-23: qty 1

## 2013-11-23 MED ORDER — OLANZAPINE 10 MG PO TBDP
10.0000 mg | ORAL_TABLET | Freq: Once | ORAL | Status: AC
  Administered 2013-11-23: 10 mg via ORAL
  Filled 2013-11-23: qty 1

## 2013-11-23 MED ORDER — DIPHENHYDRAMINE HCL 50 MG/ML IJ SOLN
INTRAMUSCULAR | Status: AC
  Administered 2013-11-23: 50 mg
  Administered 2013-11-23: 11:00:00
  Filled 2013-11-23: qty 1

## 2013-11-23 MED ORDER — HALOPERIDOL LACTATE 5 MG/ML IJ SOLN
INTRAMUSCULAR | Status: AC
  Administered 2013-11-23: 5 mg
  Filled 2013-11-23: qty 2

## 2013-11-23 MED ORDER — DIPHENHYDRAMINE HCL 50 MG/ML IJ SOLN
50.0000 mg | Freq: Once | INTRAMUSCULAR | Status: DC
  Administered 2013-11-23: 50 mg via INTRAVENOUS
  Filled 2013-11-23: qty 1

## 2013-11-23 MED ORDER — LORAZEPAM 2 MG/ML IJ SOLN: 2.0000 mg | Freq: Four times a day (QID) | INTRAMUSCULAR | Status: DC | PRN

## 2013-11-23 MED ORDER — LORAZEPAM 2 MG/ML IJ SOLN
INTRAMUSCULAR | Status: AC
  Administered 2013-11-23: 2 mg via INTRAMUSCULAR
  Filled 2013-11-23: qty 1

## 2013-11-23 MED ORDER — LORAZEPAM 2 MG/ML IJ SOLN
INTRAMUSCULAR | Status: AC
  Administered 2013-11-23: 11:00:00
  Filled 2013-11-23: qty 1

## 2013-11-23 MED ORDER — LORAZEPAM 2 MG/ML IJ SOLN: 2.0000 mg | INTRAMUSCULAR | Status: DC

## 2013-11-23 MED ORDER — HALOPERIDOL LACTATE 5 MG/ML IJ SOLN
10.0000 mg | INTRAMUSCULAR | Status: DC
  Administered 2013-11-23: 10 mg via INTRAMUSCULAR
  Filled 2013-11-23: qty 2

## 2013-11-23 NOTE — [facility] Group Notes (Signed)
[facility] Group Notes: (Clinical Social Work)   11/23/2013      Type of Therapy:  Group Therapy   Participation Level:  Did Not Attend - too labile   Leslie MantleMareida Grossman-Orr, LCSW 11/23/2013, 12:27 PM
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
## 2013-11-23 NOTE — Progress Notes (Signed)
Patient has bee sleeping since the starting of the shift. Respiration even and unlabored. Face and hands visible. Every 15 minutes check for safety. Will continue to monitor patient.

## 2013-11-23 NOTE — Progress Notes (Signed)
   Patient in bed sleeping. Respiration regular and unlabored. No sign of distress noted at this time  
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
## 2013-11-23 NOTE — Progress Notes (Signed)
D: Pt denies SI/HI/AV. Pt not cooperative and continues to have erratic behaviors. Pt rates depression at a 5, anxiety 6, and Helplessness/hopelessness at a 8.  A: Pt was offered support and encouragement. Pt was given scheduled medications and given PRN medications twice. Pt was encourage to attend groups. Q 15 minute checks were done for safety.  R:Pt attends some  groups and interacts well with peers and staff when she is not upset with staff and peers.Pt taking medicat.Pt receptive to treatment and safety maintained on unit.

## 2013-11-24 MED ORDER — BENZTROPINE MESYLATE 1 MG/ML IJ SOLN
0.5000 mg | Freq: Two times a day (BID) | INTRAMUSCULAR | Status: DC
  Filled 2013-11-24 (×6): qty 0.5

## 2013-11-24 MED ORDER — HALOPERIDOL 5 MG PO TABS
15.0000 mg | ORAL_TABLET | Freq: Every evening | ORAL | Status: DC
  Administered 2013-11-24 – 2013-11-25 (×2): 15 mg via ORAL
  Filled 2013-11-24 (×3): qty 3

## 2013-11-24 MED ORDER — HALOPERIDOL LACTATE 5 MG/ML IJ SOLN
10.0000 mg | Freq: Two times a day (BID) | INTRAMUSCULAR | Status: DC
  Filled 2013-11-24 (×6): qty 2

## 2013-11-24 MED ORDER — HALOPERIDOL 5 MG PO TABS
5.0000 mg | ORAL_TABLET | Freq: Every day | ORAL | Status: DC
  Administered 2013-11-25 – 2013-11-26 (×2): 5 mg via ORAL
  Filled 2013-11-24 (×3): qty 1

## 2013-11-24 MED ORDER — IBUPROFEN 400 MG PO TABS
400.0000 mg | ORAL_TABLET | Freq: Once | ORAL | Status: AC
  Administered 2013-11-24: 400 mg via ORAL
  Filled 2013-11-24 (×2): qty 1

## 2013-11-24 MED ORDER — OLANZAPINE 10 MG PO TBDP: 10.0000 mg | ORAL_TABLET | Freq: Two times a day (BID) | ORAL | Status: DC | PRN

## 2013-11-24 MED ORDER — OLANZAPINE 10 MG IM SOLR: 10.0000 mg | Freq: Two times a day (BID) | INTRAMUSCULAR | Status: DC | PRN

## 2013-11-24 NOTE — [facility] Group Notes (Signed)
[facility]   11/24/2013 9:55 AM  Participation Quality:  Minimal  Mood/Affect:  Irritable  Depression Rating:  denies  Anxiety Rating:  denies  Thoughts of Suicide:  No Will you contract for safety?   NA  Current AVH:  Denies, but presents as paranoid  Plan for Discharge/Comments:  Leslie [name] asked me about non-verbal communication that was going on "all around me."  "Can you tell me what is really going on?"  Disruptive, but redirectable.  Ended up walking out, but eventually returned.  Transportation Means:  unk  Supports: uncle  Leslie [name], Leslie DaubRodney [name]

## 2013-11-24 NOTE — [facility] Group Notes (Signed)
[facility] LCSW Group Therapy  11/24/2013 3:10 PM  Type of Therapy:  Group Therapy  Participation Level:  Minimal  Participation Quality:  Inattentive  Affect:  Labile  Cognitive:  Disorganized  Insight:  Limited  Engagement in Therapy:  Poor  Modes of Intervention:  Discussion, Education, Exploration, Problem-solving, Rapport Building, Socialization and Support  Summary of Progress/Problems: Today's Topic: Overcoming Obstacles. Pt identified obstacles faced currently and processed barriers involved in overcoming these obstacles. Pt identified steps necessary for overcoming these obstacles and explored motivation (internal and external) for facing these difficulties head on. Pt further identified one area of concern in their lives and chose a skill of focus pulled from their "toolbox." Luster LandsbergRenee was inattentive and disengaged during group. She left the group room twice and returned a few minutes later. Terriyah was able to participate in group discussion and stay on topic when called upon by CSW. Donna stated that "I am not where I was yesterday as far as my mood. That is a good thing." She shared that her newest obstacle involves "finding out where my kids are and being told that I can't do things all the time." Luster LandsbergRenee shows improving insight AEB her ability to state obstacles and problem solve regarding her identified obstacle. She continues to demonstrate difficulty in remaining focused and controlling mood.    Smart, Lemma Tetro LCSWA  11/24/2013, 3:10 PM

## 2013-11-24 NOTE — Plan of Care (Signed)
Problem: Diagnosis: Increased Risk For Suicide Attempt Goal: STG-Patient Will Report Suicidal Feelings to Staff Outcome: Progressing Pt denied any S/I on her self-inventory today and verbally contracted to come to staff if she were to have any thoughts of suicide.

## 2013-11-24 NOTE — Progress Notes (Signed)
Patient ID: Leslie LarocheRenee E Gonser, female   DOB: [birth year], 33 y.o.   MR[facility]: [id number] [facility]  11/24/2013 10:49 AM Leslie LarocheRenee E Rickey  MRN:  [id number]  Subjective:  Patient states," I want to go home ,you all act like Gods and walk around thinking you know what is best for me".  Objective: Patient continues to be irritable. Patient is agitated ,has pressured speech and is uncooperative. Patient cornered Clinical research associatewriter in her room ,and was loud and threatening . Patient initially refused her medications and reported did not want to take the Lithium. However when it was discussed with patient about starting another mood stabilizer ,patient reported not wanting to change it and went and took her medications including Lithium.  Patient continues to be anxious and has labile moods. Patient continues to need to take her medications regularly. Patient continues to lack insight in to her substance abuse problems, patient was abusing adderrall and valium prior to admission.  Patient was found in the hallway yelling and loud again and required prn medications.  Diagnosis:   DSM5: Primary psychiatric diagnosis:  Bipolar disorder Type I , most recent episode with mixed features   Secondary psychiatric diagnosis:  Cannabis use disorder  Stimulant (cocaine, amphetamines) use disorder,severe  Benzodiazepine (valium) use disorder ,severe   Non psychiatric diagnosis:  Herpes per history  Hepatitis C per history.      ADL's:  Impaired  Sleep: Poor  Appetite:  Poor   Psychiatric Specialty Exam: Physical Exam  ROS  Blood pressure 118/81, pulse 94, temperature 97.5 F (36.4 C), temperature source Oral, resp. rate 18, height 5\' 2"  (1.575 m), weight 58.968 kg (130 lb), last menstrual period 10/22/2013.Body mass index is 23.77 kg/(m^2).  General Appearance: Disheveled and Guarded  Eye Contact::  Good  Speech:  Pressured  Volume:  Increased  Mood:  Angry, Euphoric and Irritable  Affect:  Congruent  and Labile  Thought Process:  Circumstantial, Disorganized, Irrelevant, Loose and Tangential  Orientation:  Full (Time, Place, and Person)  Thought Content:  Obsessions and Rumination  Suicidal Thoughts:  No  Homicidal Thoughts:  No  Memory:  Immediate;   Fair Recent;   Fair  Judgement:  Impaired  Insight:  Lacking  Psychomotor Activity:  Increased and Restlessness  Concentration:  Poor  Recall:  Poor  Fund of Knowledge:Fair  Language: Good  Akathisia:  NA  Handed:  Right  AIMS (if indicated):     Assets:  ArchitectCommunication Skills Financial Resources/Insurance Housing Intimacy Physical Health Resilience Social Support Talents/Skills Transportation  Sleep:  Number of Hours: 6.25   Musculoskeletal: Strength & Muscle Tone: within normal limits Gait & Station: normal Patient leans: N/A  Current Medications: Current Facility-Administered Medications  Medication Dose Route Frequency Provider Last Rate Last Dose  . acetaminophen (TYLENOL) tablet 650 mg  650 mg Oral Q6H PRN Nanine MeansJamison Lord, NP      . alum & mag hydroxide-simeth (MAALOX/MYLANTA) 200-200-20 MG/5ML suspension 30 mL  30 mL Oral Q4H PRN Nanine MeansJamison Lord, NP      . benztropine (COGENTIN) tablet 1 mg  1 mg Oral BID Nanine MeansJamison Lord, NP   1 mg at 11/24/13 0752  . haloperidol (HALDOL) tablet 5 mg  5 mg Oral Q6H PRN Jomarie LongsSaramma Delorese Sellin, MD   5 mg at 11/24/13 1000   And  . diphenhydrAMINE (BENADRYL) capsule 50 mg  50 mg Oral Q6H PRN Artemis Koller, MD      . haloperidol lactate (HALDOL) injection 5 mg  5 mg Intramuscular  Q6H PRN Jomarie LongsSaramma Carlin Attridge, MD   5 mg at 11/21/13 1043   And  . diphenhydrAMINE (BENADRYL) injection 50 mg  50 mg Intramuscular Q6H PRN Jomarie LongsSaramma Milbern Doescher, MD   50 mg at 11/22/13 1030  . haloperidol (HALDOL) tablet 10 mg  10 mg Oral QPM Shuvon Rankin, NP   10 mg at 11/23/13 1800  . haloperidol (HALDOL) tablet 5 mg  5 mg Oral BID Nanine MeansJamison Lord, NP   5 mg at 11/24/13 0751  . hydrOXYzine (ATARAX/VISTARIL) tablet 25 mg  25 mg Oral Q4H  PRN Nanine MeansJamison Lord, NP   25 mg at 11/23/13 0617  . [START ON 11/27/2013] hydrOXYzine (ATARAX/VISTARIL) tablet 25 mg  25 mg Oral Q8H PRN Jomarie LongsSaramma Kree Rafter, MD      . lithium carbonate capsule 600 mg  600 mg Oral BID WC Shuvon Rankin, NP   600 mg at 11/24/13 1000  . loperamide (IMODIUM) capsule 2-4 mg  2-4 mg Oral PRN Jomarie LongsSaramma Annaly Skop, MD      . LORazepam (ATIVAN) injection 2 mg  2 mg Intramuscular Q6H PRN Nehemiah SettleJanardhaha R Jonnalagadda, MD      . LORazepam (ATIVAN) tablet 1 mg  1 mg Oral Q6H PRN Jomarie LongsSaramma Adewale Pucillo, MD   1 mg at 11/24/13 1000  . magnesium hydroxide (MILK OF MAGNESIA) suspension 30 mL  30 mL Oral Daily PRN Shuvon Rankin, NP      . multivitamin with minerals tablet 1 tablet  1 tablet Oral Daily Jomarie LongsSaramma Egon Dittus, MD   1 tablet at 11/24/13 0751  . nicotine (NICODERM CQ - dosed in mg/24 hours) patch 21 mg  21 mg Transdermal Q2000 Jomarie LongsSaramma Dalila Arca, MD   21 mg at 11/24/13 0811  . ondansetron (ZOFRAN-ODT) disintegrating tablet 4 mg  4 mg Oral Q6H PRN Jomarie LongsSaramma Fartun Paradiso, MD      . thiamine (VITAMIN B-1) tablet 100 mg  100 mg Oral Daily Lindia Garms, MD   100 mg at 11/24/13 0751  . traZODone (DESYREL) tablet 100 mg  100 mg Oral QHS Shuvon Rankin, NP   100 mg at 11/24/13 0148    Lab Results:  No results found for this or any previous visit (from the past 48 hour(s)).  Physical Findings: AIMS: Facial and Oral Movements Muscles of Facial Expression: None, normal Lips and Perioral Area: None, normal Jaw: None, normal Tongue: None, normal,Extremity Movements Upper (arms, wrists, hands, fingers): None, normal Lower (legs, knees, ankles, toes): None, normal, Trunk Movements Neck, shoulders, hips: None, normal, Overall Severity Severity of abnormal movements (highest score from questions above): None, normal Incapacitation due to abnormal movements: None, normal Patient's awareness of abnormal movements (rate only patient's report): No Awareness, Dental Status Current problems with teeth and/or dentures?:  No Does patient usually wear dentures?: No  CIWA:  CIWA-Ar Total: 0 COWS:     Treatment Plan Summary: Daily contact with patient to assess and evaluate symptoms and progress in treatment Medication management  Plan: Patient is on  forced medication due to severe manic symptoms, poor insight, judgment and impulse control, irritabiliy, agitated and can be aggressive and dangerous to other people and disturbing the unit milieu.  Patient will be continued on Haldol IM/PO ,but will increase the dose to Haldol 5 mg qam and 15 mg po qpm along with cogentin PO/IM as scheduled. Will continue Lithium 600 mg po bid for mood lability. Will discontinue Ativan prn ,since she is medication seeking . Will start Zyprexa PRN IM/PO for agitation as well as severe anxiety. Will provide vistaril 25 mg  po prn for mild anxiety. Trazodone 100 mg po qhs for sleep. Will get Lithium level tomorrow AM.   Encourage to be compliant with medication and group therapies Continue close monitoring  Medical Decision Making Problem Points:  Established problem, worsening (2), Review of last therapy session (1), Review of psycho-social stressors (1) and Self-limited or minor (1) Data Points:  Review or order clinical lab tests (1) Review or order medicine tests (1) Review of medication regiment & side effects (2) Review of new medications or change in dosage (2)  I certify that inpatient services furnished can reasonably be expected to improve the patient's condition.   Deyonte Cadden MD 11/24/2013, 10:49 AM

## 2013-11-24 NOTE — Progress Notes (Signed)
Pt has been pacing the hallway today. She is very labile and takes a lot of redirecting her.  She initially refused her lithium she stated,"the nurse told me last night that the lithium is the strongest medication and I don't feel I need to take it". After the doctor spoke with Nema, pt agreed to take which she did around 1000. She was too agitated to give much information from her self-inventory. She got angry ripped it and threw in the trash can at the medication room this morning. She denies any A/V/H or S/I.  She was given a one-time dose of ibuprofen 400 mg she refused to take until about 1302. At 1640 pt took her 1700 medications without incident.

## 2013-11-25 LAB — LITHIUM LEVEL: Lithium Lvl: 0.52 mEq/L — ABNORMAL LOW (ref 0.80–1.40)

## 2013-11-25 MED ORDER — VALACYCLOVIR HCL 500 MG PO TABS
500.0000 mg | ORAL_TABLET | Freq: Two times a day (BID) | ORAL | Status: DC
  Administered 2013-11-25 – 2013-11-26 (×2): 500 mg via ORAL
  Filled 2013-11-25 (×4): qty 1

## 2013-11-25 NOTE — Progress Notes (Signed)
Patient ID: Leslie [name], female   DOB: 1980/12/08, 33 y.o.   MR[facility]: [id number] [facility]  11/25/2013 9:50 AM Leslie [name]  MRN:  [id number]  Subjective:  Patient states," I am OK".  Objective: Patient continues to be irritable , but is improving . Patient has been more compliant with her medications . However her Dierdre SearlesLi level is subtherapeutic -could be likely due to noncompliance.  Patient continues to have pressured speech and is disorganized, but with some improvement. Patient is fixed on discharge today.  Patient continues to lack insight in to her substance abuse problems, patient was abusing adderrall and valium prior to admission.Will continue to educate and encourage.   Diagnosis:   DSM5: Primary psychiatric diagnosis:  Bipolar disorder Type I , most recent episode with mixed features   Secondary psychiatric diagnosis:  Cannabis use disorder  Stimulant (cocaine, amphetamines) use disorder,severe  Benzodiazepine (valium) use disorder ,severe   Non psychiatric diagnosis:  Herpes per history  Hepatitis C per history.      ADL's:  Intact  Sleep: Fair  Appetite:  Fair   Psychiatric Specialty Exam: Physical Exam  ROS  Blood pressure 123/58, pulse 121, temperature 97.4 F (36.3 C), temperature source Oral, resp. rate 20, height 5\' 2"  (1.575 m), weight 58.968 kg (130 lb), last menstrual period 10/22/2013.Body mass index is 23.77 kg/(m^2).  General Appearance: Disheveled and Guardedimproving  Eye Contact::  Good  Speech:  Pressured  Volume:  Increased  Mood:  Angry, Euphoric and Irritable improving  Affect:  Labile  Thought Process:  Circumstantial, Disorganized, Irrelevant, Loose and Tangential  Orientation:  Full (Time, Place, and Person)  Thought Content:  Obsessions and Rumination  Suicidal Thoughts:  No  Homicidal Thoughts:  No  Memory:  Immediate;   Fair Recent;   Fair  Judgement:  Impaired  Insight:  Lacking  Psychomotor Activity:  Increased and  Restlessness  Concentration:  Fair  Recall:  [employer]Fair  Fund of Knowledge:Fair  Language: Good  Akathisia:  NA  Handed:  Right  AIMS (if indicated):     Assets:  ArchitectCommunication Skills Financial Resources/Insurance Housing Intimacy Physical Health Resilience Social Support Talents/Skills Transportation  Sleep:  Number of Hours: 6   Musculoskeletal: Strength & Muscle Tone: within normal limits Gait & Station: normal Patient leans: N/A  Current Medications: Current Facility-Administered Medications  Medication Dose Route Frequency Provider Last Rate Last Dose  . acetaminophen (TYLENOL) tablet 650 mg  650 mg Oral Q6H PRN Nanine MeansJamison Lord, NP      . alum & mag hydroxide-simeth (MAALOX/MYLANTA) 200-200-20 MG/5ML suspension 30 mL  30 mL Oral Q4H PRN Nanine MeansJamison Lord, NP      . benztropine (COGENTIN) tablet 1 mg  1 mg Oral BID Nanine MeansJamison Lord, NP   1 mg at 11/25/13 0749  . haloperidol lactate (HALDOL) injection 10 mg  10 mg Intramuscular BID Jomarie LongsSaramma Maki Sweetser, MD       And  . benztropine mesylate (COGENTIN) injection 0.5 mg  0.5 mg Intramuscular BID Jomarie LongsSaramma Lashan Macias, MD      . haloperidol (HALDOL) tablet 15 mg  15 mg Oral QPM Krystle Oberman, MD   15 mg at 11/24/13 1813  . haloperidol (HALDOL) tablet 5 mg  5 mg Oral Daily Jomarie LongsSaramma Lajarvis Italiano, MD   5 mg at 11/25/13 0750  . [START ON 11/27/2013] hydrOXYzine (ATARAX/VISTARIL) tablet 25 mg  25 mg Oral Q8H PRN Olivea Sonnen, MD      . lithium carbonate capsule 600 mg  600 mg  Oral BID WC Shuvon Rankin, NP   600 mg at 11/25/13 0749  . magnesium hydroxide (MILK OF MAGNESIA) suspension 30 mL  30 mL Oral Daily PRN Shuvon Rankin, NP      . multivitamin with minerals tablet 1 tablet  1 tablet Oral Daily Jomarie LongsSaramma Alise Calais, MD   1 tablet at 11/25/13 0750  . nicotine (NICODERM CQ - dosed in mg/24 hours) patch 21 mg  21 mg Transdermal Q2000 Jomarie LongsSaramma Daemyn Gariepy, MD   21 mg at 11/25/13 0612  . OLANZapine zydis (ZYPREXA) disintegrating tablet 10 mg  10 mg Oral BID PRN Jomarie LongsSaramma Marvelle Caudill, MD        Or  . OLANZapine (ZYPREXA) injection 10 mg  10 mg Intramuscular BID PRN Jomarie LongsSaramma Miah Boye, MD      . thiamine (VITAMIN B-1) tablet 100 mg  100 mg Oral Daily Jaiven Graveline, MD   100 mg at 11/25/13 0750  . traZODone (DESYREL) tablet 100 mg  100 mg Oral QHS Shuvon Rankin, NP   100 mg at 11/24/13 2305    Lab Results:  Results for orders placed during the hospital encounter of 11/21/13 (from the past 48 hour(s))  LITHIUM LEVEL     Status: Abnormal   Collection Time    11/25/13  6:38 AM      Result Value Ref Range   Lithium Lvl 0.52 (*) 0.80 - 1.40 mEq/L   Comment[facility]    Physical Findings: AIMS: Facial and Oral Movements Muscles of Facial Expression: None, normal Lips and Perioral Area: None, normal Jaw: None, normal Tongue: None, normal,Extremity Movements Upper (arms, wrists, hands, fingers): None, normal Lower (legs, knees, ankles, toes): None, normal, Trunk Movements Neck, shoulders, hips: None, normal, Overall Severity Severity of abnormal movements (highest score from questions above): None, normal Incapacitation due to abnormal movements: None, normal Patient's awareness of abnormal movements (rate only patient's report): No Awareness, Dental Status Current problems with teeth and/or dentures?: No Does patient usually wear dentures?: No  CIWA:  CIWA-Ar Total: 10 COWS:     Treatment Plan Summary: Daily contact with patient to assess and evaluate symptoms and progress in treatment Medication management  Plan: Patient is on  forced medication due to severe manic symptoms, poor insight, judgment and impulse control, irritabiliy, agitated and can be aggressive and dangerous to other people and disturbing the unit milieu.  Patient will be continued on Haldol IM/PO ,but will continue  Haldol 5 mg qam and 15 mg po qpm along with cogentin PO/IM as scheduled. Will continue Lithium 600 mg po bid for mood lability.Will encourage  compliance. Discontinue Ativan prn ,since she is medication seeking . Will continue Zyprexa PRN IM/PO for agitation as well as severe anxiety. Will provide vistaril 25 mg po prn for mild anxiety. Trazodone 100 mg po qhs for sleep.   Encourage to be compliant with medication and group therapies Continue close monitoring  Medical Decision Making Problem Points:  Established problem, stable/improving (1), Review of last therapy session (1), Review of psycho-social stressors (1) and Self-limited or minor (1) Data Points:  Review of medication regiment & side effects (2) Review of new medications or change in dosage (2)  I certify that inpatient services furnished can reasonably be expected to improve the patient's condition.   Cosimo Schertzer MD 11/25/2013, 9:50 AM

## 2013-11-25 NOTE — Progress Notes (Signed)
D: Patient denies SI/HI and A/V hallucinations; patient reports " I want to be discharged today but I know I probably won't; patient has speaking of her children and how she would like to spend more time with them and that the conversation with her mom went well  A: Monitored q 15 minutes; patient encouraged to attend groups; patient educated about medications; patient given medications per physician orders; patient encouraged to express feelings and/or concerns  R: Patient has been pacing up and down the hall during her free moments but participating in groups; patient is engaging in groups; patient is laying in the bed and taking a nap; patient's interaction with staff and peers is appropriate; patient was able to set goal to talk with staff 1:1 when having feelings of SI; patient is taking medications as prescribed and tolerating medications

## 2013-11-25 NOTE — [facility] Group Notes (Signed)
The focus of this group is to educate the patient on the purpose and policies of crisis stabilization and provide a format to answer questions about their admission.  The group details unit policies and expectations of patients while admitted. Patient attended this group and was cooperative and engaged.

## 2013-11-25 NOTE — Plan of Care (Signed)
Problem: Food- and Nutrition-Related Knowledge Deficit (NB-1.1) Goal: Nutrition education Formal process to instruct or train a patient/client in a skill or to impart knowledge to help patients/clients voluntarily manage or modify food choices and eating behavior to maintain or improve health. Outcome: Completed/Met Date Met:  11/25/13 Nutrition Education Note  RD consulted for nutrition education regarding a Heart Healthy diet.   Lipid Panel     Component Value Date/Time    CHOL 182 11/22/2013 0630    TRIG 131 11/22/2013 0630    HDL 52 11/22/2013 0630    CHOLHDL 3.5 11/22/2013 0630    VLDL 26 11/22/2013 0630    LDLCALC 104* 11/22/2013 0630    RD provided "Plate Method" handout. Reviewed patient's dietary recall. Provided examples on ways to decrease sodium and fat intake in diet. Discouraged intake of processed foods and use of salt shaker. Encouraged fresh fruits and vegetables as well as whole grain sources of carbohydrates to maximize fiber intake. Teach back method used.  Pt reports no longer having food stamps and eating 'when and what she can". She states that she is more focused on feeding her uncle. Pt states that she is eating well currently.  Expect poor compliance.  Body mass index is 23.77 kg/(m^2). Pt meets criteria for normal range based on current BMI.  Labs and medications reviewed: Lithium carbonate, Desyrel     No further nutrition interventions warranted at this time. RD contact information provided. If additional nutrition issues arise, please re-consult RD.  Clayton Bibles, MS, RD, LDN Pager: [phone] After Hours Pager: [phone]

## 2013-11-25 NOTE — [facility] Group Notes (Signed)
[facility] LCSW Group Therapy  11/25/2013 11:50 AM  Type of Therapy:  Group Therapy  Participation Level:  Active  Participation Quality:  Intrusive  Affect:  Excited  Cognitive:  Lacking  Insight:  Limited  Engagement in Therapy:  Distracting  Modes of Intervention:  Discussion, Education, Exploration, Limit-setting, Rapport Building, Socialization and Support  Summary of Progress/Problems:  Feelings around Diagnosis--patients were asked to talk about what diagnosis means to them, process if and why it is important to know their mental health diagnosis, and discuss pros and cons of having a mental health diagnosis. Leslie [name] was attentive and disruptive at times during today's processing group. Leslie [name] shared that she is happy to know her diagnosis because it helps her figure out "what type of help I need." Leslie [name] tended to make inappropriate comments and talk over others in group but was redirectable by CSW. Leslie [name] appears to be making some progress in the group setting and demonstrates limited insight at this time.    Smart, Carinna Newhart LCSWA 11/25/2013, 11:50 AM

## 2013-11-25 NOTE — Tx Team (Signed)
  Interdisciplinary Treatment Plan Update   Date Reviewed:  11/25/2013  Time Reviewed:  4:27 PM  Progress in Treatment:   Attending groups: Yes Participating in groups: Yes Taking medication as prescribed: Yes  Tolerating medication: Yes Family/Significant other contact made: Yes  Patient understands diagnosis: Yes  Discussing patient identified problems/goals with staff: Yes  See initial care plan Medical problems stabilized or resolved: Yes Denies suicidal/homicidal ideation: Yes  In tx team Patient has not harmed self or others: Yes  For review of initial/current patient goals, please see plan of care.  Estimated Length of Stay:  Likely d/c tomorrow  Reason for Continuation of Hospitalization:   New Problems/Goals identified:  N/A  Discharge Plan or Barriers:   [place] home, [facility]  Additional Comments:  Attendees:  Signature: Ivin BootySarama Eappen, MD 11/25/2013 4:27 PM   Signature: Richelle Itood Claryssa Sandner, LCSW 11/25/2013 4:27 PM  Signature:  11/25/2013 4:27 PM  Signature: Harold Barbanonecia Byrd, RN 11/25/2013 4:27 PM  Signature:  11/25/2013 4:27 PM  Signature:  11/25/2013 4:27 PM  Signature:   11/25/2013 4:27 PM  Signature:    Signature:    Signature:    Signature:    Signature:    Signature:      Scribe for Treatment Team:   Richelle Itood Jamin Humphries, LCSW  11/25/2013 4:27 PM

## 2013-11-25 NOTE — Progress Notes (Addendum)
D: Pt in bed resting with eyes closed. Respirations even and unlabored. Pt appears to be in no signs of distress at this time. A: Q1615min checks remains for this pt. R: Pt remains safe at this time.   Pt remains asleep during writer's multiple checks on patient.   Scheduled Trazodone administered by another RN. Writer off unit admitting a new patient at this time.

## 2013-11-26 LAB — HIV ANTIBODY (ROUTINE TESTING W REFLEX): HIV 1&2 Ab, 4th Generation: NONREACTIVE

## 2013-11-26 LAB — GC/CHLAMYDIA PROBE AMP
CT Probe RNA: NEGATIVE
GC Probe RNA: NEGATIVE

## 2013-11-26 MED ORDER — NICOTINE 21 MG/24HR TD PT24
21.0000 mg | MEDICATED_PATCH | Freq: Every day | TRANSDERMAL | Status: DC
  Administered 2013-11-26: 21 mg via TRANSDERMAL
  Filled 2013-11-26 (×2): qty 1

## 2013-11-26 MED ORDER — TRAZODONE HCL 100 MG PO TABS: 100.0000 mg | ORAL_TABLET | Freq: Every day | ORAL | Status: DC

## 2013-11-26 MED ORDER — HALOPERIDOL 5 MG PO TABS: 15.0000 mg | ORAL_TABLET | Freq: Every evening | ORAL | Status: DC

## 2013-11-26 MED ORDER — ADULT MULTIVITAMIN W/MINERALS CH: 1.0000 | ORAL_TABLET | Freq: Every day | ORAL | Status: DC

## 2013-11-26 MED ORDER — HALOPERIDOL 5 MG PO TABS: 5.0000 mg | ORAL_TABLET | Freq: Every day | ORAL | Status: DC

## 2013-11-26 MED ORDER — VALACYCLOVIR HCL 500 MG PO TABS: 500.0000 mg | ORAL_TABLET | Freq: Two times a day (BID) | ORAL | Status: DC

## 2013-11-26 MED ORDER — BENZTROPINE MESYLATE 1 MG PO TABS: 1.0000 mg | ORAL_TABLET | Freq: Two times a day (BID) | ORAL | Status: DC

## 2013-11-26 MED ORDER — LITHIUM CARBONATE 600 MG PO CAPS: 600.0000 mg | ORAL_CAPSULE | Freq: Two times a day (BID) | ORAL | Status: DC

## 2013-11-26 NOTE — Progress Notes (Addendum)
Patient discharged per physician order; patient denies SI/HI and A/V hallucinations; patient refused medication samples, patient received prescriptions and copy of AVS; patient verbalized and signed that she received all her belongings; patient had no other questions or concerns ; patient left the unit ambulatory 

## 2013-11-26 NOTE — Consult Note (Signed)
Face to face evaluation and I agree with this note 
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
## 2013-11-26 NOTE — Progress Notes (Signed)
D: Pt remained in bed asleep for the majority of the shift. Pt wakes up around midnight to request her trazodone. Pt appeared anxious in affect. Pt reports that she has been up attending groups in the daytime. She is adamantly denies any SI/HI/AVH. A: Writer administered scheduled medications to pt, per MD orders. Continued support and availability as needed was extended to this pt. Staff continue to monitor pt with q3415min checks.  R: No adverse drug reactions noted. Pt receptive to treatment. Pt remains safe at this time.

## 2013-11-26 NOTE — Progress Notes (Signed)
D: Pt presents with asymptomatic hypotension this morning. Pt was encouraged to increase her fluid intake. Fluids administered to pt.  She reports eating a full meal at breakfast. Writer instructed pt to report any physical concerns to staff immediately. Pt receptive to education.

## 2013-11-26 NOTE — [facility] Suicide Risk Assessment (Signed)
[facility] INPATIENT:  Family/Significant Other Suicide Prevention Education  Suicide Prevention Education:  Contact Attempts: Heron SabinsViola Ferraiolo, mother, [phone], with whom pt does not reside, has been identified by the patient as the family member/significant other with whom the patient will be residing, and identified as the person(s) who will aid the patient in the event of a mental health crisis.  With written consent from the patient, two attempts were made to provide suicide prevention education, prior to and/or following the patient's discharge.  We were unsuccessful in providing suicide prevention education.  A suicide education pamphlet was given to the patient to share with family/significant other.  Date and time of first attempt:11/25/13 12:19 PM  Date and time of second attempt:11/26/2013 9:20AM  Ida Rogueorth, Sahra Converse B 11/26/2013, 12:18 PM

## 2013-11-26 NOTE — Progress Notes (Addendum)
[facility] Discharge Plan :  Will you be returning to the same living situation after discharge: Yes,  with relatives At discharge, do you have transportation home?:Yes,  family Do you have the ability to pay for your medications:Yes,  mental health  Release of information consent forms completed and in the chart;  Patient's signature needed at discharge.  Patient to Follow up at: Follow-up Information   Follow up with [facility]    . Call today. (As needed for follow up of medical problems such as having Valtrex continued)    Contact information:   [address] [place] [address] [phone]      Follow up with [facility]. (Go to the walk-in clinic M-F between 8 and 10 AM for your hospital follow up appointment)    Contact information:   [address]  [place]  [phone]      Patient denies SI/HI:   Yes,  yes    Safety Planning and Suicide Prevention discussed:  Yes,  yes  Pt left without taking samples-said she could not wait.  Talked about having prescriptions for Stimulants and Benzos at home.  [name], Leslie [name] B 11/26/2013, 11:17 AM

## 2013-12-01 NOTE — Progress Notes (Signed)
Patient Discharge Instructions:  After Visit Summary (AVS):   Faxed to:  12/01/13 Discharge Summary Note:   Faxed to:  12/01/13 Psychiatric Admission Assessment Note:   Faxed to:  12/01/13 Suicide Risk Assessment - Discharge Assessment:   Faxed to:  12/01/13 Faxed/Sent to the Next Level Care provider:  12/01/13 Next Level Care Provider Has Access to the EMR, 12/01/13  [facility] @ [phone] Records provided to [facility] via CH[facility]/Epic access  Jerelene ReddenSheena E Covelo, 12/01/2013, 3:44 PM

## 2014-03-03 ENCOUNTER — Encounter (HOSPITAL_COMMUNITY): Payer: Self-pay | Admitting: *Deleted

## 2014-03-03 ENCOUNTER — Emergency Department (HOSPITAL_COMMUNITY)
Admission: EM | Admit: 2014-03-03 | Discharge: 2014-03-03 | Discharge status: Against Medical Advice | Payer: MEDICAID | Attending: Emergency Medicine | Admitting: Emergency Medicine

## 2014-03-03 DIAGNOSIS — F319 Bipolar disorder, unspecified: Secondary | ICD-10-CM | POA: Insufficient documentation

## 2014-03-03 DIAGNOSIS — Z79899 Other long term (current) drug therapy: Secondary | ICD-10-CM | POA: Insufficient documentation

## 2014-03-03 DIAGNOSIS — Z8619 Personal history of other infectious and parasitic diseases: Secondary | ICD-10-CM | POA: Insufficient documentation

## 2014-03-03 DIAGNOSIS — Y92410 Unspecified street and highway as the place of occurrence of the external cause: Secondary | ICD-10-CM | POA: Insufficient documentation

## 2014-03-03 DIAGNOSIS — F410 Panic disorder [episodic paroxysmal anxiety] without agoraphobia: Secondary | ICD-10-CM | POA: Insufficient documentation

## 2014-03-03 DIAGNOSIS — Z720 Tobacco use: Secondary | ICD-10-CM | POA: Insufficient documentation

## 2014-03-03 DIAGNOSIS — Y998 Other external cause status: Secondary | ICD-10-CM | POA: Insufficient documentation

## 2014-03-03 DIAGNOSIS — F22 Delusional disorders: Secondary | ICD-10-CM | POA: Insufficient documentation

## 2014-03-03 DIAGNOSIS — Y9389 Activity, other specified: Secondary | ICD-10-CM | POA: Insufficient documentation

## 2014-03-03 HISTORY — DX: Bipolar disorder, unspecified: F31.9

## 2014-03-03 LAB — CBC WITH DIFFERENTIAL/PLATELET
BASOS ABS: 0.1 10*3/uL (ref 0.0–0.1)
Basophils Relative: 1 % (ref 0–1)
EOS ABS: 0.5 10*3/uL (ref 0.0–0.7)
EOS PCT: 4 % (ref 0–5)
HCT: 44.8 % (ref 36.0–46.0)
HEMOGLOBIN: 15.1 g/dL — AB (ref 12.0–15.0)
Lymphocytes Relative: 26 % (ref 12–46)
Lymphs Abs: 3.3 10*3/uL (ref 0.7–4.0)
MCH: 29.9 pg (ref 26.0–34.0)
MCHC: 33.7 g/dL (ref 30.0–36.0)
MCV: 88.7 fL (ref 78.0–100.0)
Monocytes Absolute: 0.7 10*3/uL (ref 0.1–1.0)
Monocytes Relative: 6 % (ref 3–12)
Neutro Abs: 7.9 10*3/uL — ABNORMAL HIGH (ref 1.7–7.7)
Neutrophils Relative %: 63 % (ref 43–77)
Platelets: 324 10*3/uL (ref 150–400)
RBC: 5.05 MIL/uL (ref 3.87–5.11)
RDW: 11.9 % (ref 11.5–15.5)
WBC: 12.4 10*3/uL — ABNORMAL HIGH (ref 4.0–10.5)

## 2014-03-03 LAB — I-STAT CHEM 8, ED
BUN: 18 mg/dL (ref 6–23)
Calcium, Ion: 1.11 mmol/L — ABNORMAL LOW (ref 1.12–1.23)
Chloride: 104 mmol/L (ref 96–112)
Creatinine, Ser: 0.8 mg/dL (ref 0.50–1.10)
Glucose, Bld: 92 mg/dL (ref 70–99)
HCT: 47 % — ABNORMAL HIGH (ref 36.0–46.0)
Hemoglobin: 16 g/dL — ABNORMAL HIGH (ref 12.0–15.0)
Potassium: 4.8 mmol/L (ref 3.5–5.1)
Sodium: 139 mmol/L (ref 135–145)
TCO2: 25 mmol/L (ref 0–100)

## 2014-03-03 MED ORDER — HALOPERIDOL 5 MG PO TABS
15.0000 mg | ORAL_TABLET | Freq: Every evening | ORAL | Status: DC
Start: 2014-03-03 — End: 2014-03-03

## 2014-03-03 MED ORDER — HALOPERIDOL 5 MG PO TABS
5.0000 mg | ORAL_TABLET | Freq: Every evening | ORAL | Status: DC
  Filled 2014-03-03: qty 1

## 2014-03-03 MED ORDER — BENZTROPINE MESYLATE 1 MG PO TABS
1.0000 mg | ORAL_TABLET | Freq: Two times a day (BID) | ORAL | Status: DC
  Filled 2014-03-03: qty 1
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
## 2014-03-03 NOTE — ED Notes (Signed)
Pt outside of room yelling about air planes, men and reading her rights, pt yelling she wants to call a ride now, pt calmed down and instructed to sit down in room.
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
## 2014-03-04 NOTE — ED Notes (Signed)
Pt seen walking from ED to [facility].

## 2014-03-12 ENCOUNTER — Ambulatory Visit (HOSPITAL_COMMUNITY)
Admission: RE | Admit: 2014-03-12 | Discharge: 2014-03-12 | Disposition: A | Discharge status: Home / Self Care | Payer: MEDICAID | Attending: Psychiatry | Admitting: Psychiatry

## 2014-03-12 ENCOUNTER — Emergency Department (HOSPITAL_COMMUNITY)
Admission: EM | Admit: 2014-03-12 | Discharge: 2014-03-13 | Disposition: A | Discharge status: Home / Self Care | Payer: No Typology Code available for payment source | Attending: Emergency Medicine | Admitting: Emergency Medicine

## 2014-03-12 ENCOUNTER — Encounter (HOSPITAL_COMMUNITY): Payer: Self-pay

## 2014-03-12 DIAGNOSIS — F900 Attention-deficit hyperactivity disorder, predominantly inattentive type: Secondary | ICD-10-CM | POA: Insufficient documentation

## 2014-03-12 DIAGNOSIS — F22 Delusional disorders: Secondary | ICD-10-CM

## 2014-03-12 DIAGNOSIS — F17210 Nicotine dependence, cigarettes, uncomplicated: Secondary | ICD-10-CM | POA: Diagnosis not present

## 2014-03-12 DIAGNOSIS — F410 Panic disorder [episodic paroxysmal anxiety] without agoraphobia: Secondary | ICD-10-CM | POA: Diagnosis not present

## 2014-03-12 DIAGNOSIS — Z79899 Other long term (current) drug therapy: Secondary | ICD-10-CM | POA: Insufficient documentation

## 2014-03-12 DIAGNOSIS — F1210 Cannabis abuse, uncomplicated: Secondary | ICD-10-CM | POA: Insufficient documentation

## 2014-03-12 DIAGNOSIS — F209 Schizophrenia, unspecified: Secondary | ICD-10-CM | POA: Diagnosis present

## 2014-03-12 DIAGNOSIS — F1410 Cocaine abuse, uncomplicated: Secondary | ICD-10-CM | POA: Insufficient documentation

## 2014-03-12 DIAGNOSIS — Z8619 Personal history of other infectious and parasitic diseases: Secondary | ICD-10-CM | POA: Insufficient documentation

## 2014-03-12 DIAGNOSIS — F1310 Sedative, hypnotic or anxiolytic abuse, uncomplicated: Secondary | ICD-10-CM | POA: Insufficient documentation

## 2014-03-12 DIAGNOSIS — B1920 Unspecified viral hepatitis C without hepatic coma: Secondary | ICD-10-CM | POA: Insufficient documentation

## 2014-03-12 DIAGNOSIS — F329 Major depressive disorder, single episode, unspecified: Secondary | ICD-10-CM | POA: Insufficient documentation

## 2014-03-12 DIAGNOSIS — Z87828 Personal history of other (healed) physical injury and trauma: Secondary | ICD-10-CM | POA: Insufficient documentation

## 2014-03-12 DIAGNOSIS — Z599 Problem related to housing and economic circumstances, unspecified: Secondary | ICD-10-CM | POA: Diagnosis not present

## 2014-03-12 DIAGNOSIS — F1510 Other stimulant abuse, uncomplicated: Secondary | ICD-10-CM | POA: Insufficient documentation

## 2014-03-12 LAB — COMPREHENSIVE METABOLIC PANEL
ALBUMIN: 4.5 g/dL (ref 3.5–5.2)
ALT: 58 U/L — AB (ref 0–35)
ANION GAP: 9 (ref 5–15)
AST: 28 U/L (ref 0–37)
Alkaline Phosphatase: 73 U/L (ref 39–117)
BUN: 15 mg/dL (ref 6–23)
CALCIUM: 9.6 mg/dL (ref 8.4–10.5)
CHLORIDE: 107 mmol/L (ref 96–112)
CO2: 24 mmol/L (ref 19–32)
CREATININE: 0.74 mg/dL (ref 0.50–1.10)
GFR calc Af Amer: >90 mL/min (ref >90)
GFR calc non Af Amer: >90 mL/min (ref >90)
Glucose, Bld: 123 mg/dL — ABNORMAL HIGH (ref 70–99)
Potassium: 4.1 mmol/L (ref 3.5–5.1)
SODIUM: 140 mmol/L (ref 135–145)
Total Bilirubin: 0.5 mg/dL (ref 0.3–1.2)
Total Protein: 8.2 g/dL (ref 6.0–8.3)

## 2014-03-12 LAB — CBC
HEMATOCRIT: 45.6 % (ref 36.0–46.0)
Hemoglobin: 15.7 g/dL — ABNORMAL HIGH (ref 12.0–15.0)
MCH: 30.4 pg (ref 26.0–34.0)
MCHC: 34.4 g/dL (ref 30.0–36.0)
MCV: 88.4 fL (ref 78.0–100.0)
Platelets: 355 10*3/uL (ref 150–400)
RBC: 5.16 MIL/uL — AB (ref 3.87–5.11)
RDW: 11.9 % (ref 11.5–15.5)
WBC: 11.4 10*3/uL — ABNORMAL HIGH (ref 4.0–10.5)

## 2014-03-12 LAB — RAPID URINE DRUG SCREEN, HOSP PERFORMED
Amphetamines: POSITIVE — AB
Barbiturates: NOT DETECTED
Benzodiazepines: POSITIVE — AB
COCAINE: POSITIVE — AB
Opiates: NOT DETECTED
Tetrahydrocannabinol: NOT DETECTED

## 2014-03-12 LAB — ETHANOL

## 2014-03-12 LAB — SALICYLATE LEVEL: Salicylate Lvl: <4 mg/dL (ref 2.8–20.0)

## 2014-03-12 LAB — ACETAMINOPHEN LEVEL

## 2014-03-12 MED ORDER — IBUPROFEN 200 MG PO TABS
600.0000 mg | ORAL_TABLET | Freq: Once | ORAL | Status: AC
  Administered 2014-03-12: 600 mg via ORAL
  Filled 2014-03-12: qty 3

## 2014-03-12 MED ORDER — LORAZEPAM 1 MG PO TABS
2.0000 mg | ORAL_TABLET | Freq: Once | ORAL | Status: AC
  Administered 2014-03-12: 1 mg via ORAL
  Filled 2014-03-12: qty 2
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
## 2014-03-12 NOTE — ED Notes (Signed)
Pt up at nurses station calling someone to come get her, [facility] and told them how she was acting, they are going to have a counselor come and get her consents signed.
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
## 2014-03-12 NOTE — ED Notes (Signed)
[facility], needs medical [facility]
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
## 2014-03-12 NOTE — ED Notes (Signed)
[facility], waiting on IVC papers

## 2014-03-12 NOTE — ED Notes (Signed)
PA is completing IVC on patient, pt [facility] to leave and not [facility]

## 2014-03-13 ENCOUNTER — Inpatient Hospital Stay (HOSPITAL_COMMUNITY)
Admission: EM | Admit: 2014-03-13 | Discharge: 2014-03-19 | DRG: 885 | Disposition: A | Discharge status: Home / Self Care | Payer: Federal, State, Local not specified - Other | Source: Intra-hospital | Attending: Psychiatry | Admitting: Psychiatry

## 2014-03-13 ENCOUNTER — Encounter (HOSPITAL_COMMUNITY): Payer: Self-pay

## 2014-03-13 DIAGNOSIS — F1410 Cocaine abuse, uncomplicated: Secondary | ICD-10-CM | POA: Diagnosis present

## 2014-03-13 DIAGNOSIS — F312 Bipolar disorder, current episode manic severe with psychotic features: Principal | ICD-10-CM | POA: Diagnosis present

## 2014-03-13 DIAGNOSIS — F1390 Sedative, hypnotic, or anxiolytic use, unspecified, uncomplicated: Secondary | ICD-10-CM

## 2014-03-13 DIAGNOSIS — F1290 Cannabis use, unspecified, uncomplicated: Secondary | ICD-10-CM

## 2014-03-13 DIAGNOSIS — F411 Generalized anxiety disorder: Secondary | ICD-10-CM | POA: Diagnosis present

## 2014-03-13 DIAGNOSIS — F3110 Bipolar disorder, current episode manic without psychotic features, unspecified: Secondary | ICD-10-CM | POA: Diagnosis present

## 2014-03-13 DIAGNOSIS — Z9114 Patient's other noncompliance with medication regimen: Secondary | ICD-10-CM | POA: Diagnosis present

## 2014-03-13 DIAGNOSIS — F1590 Other stimulant use, unspecified, uncomplicated: Secondary | ICD-10-CM | POA: Diagnosis present

## 2014-03-13 DIAGNOSIS — Z833 Family history of diabetes mellitus: Secondary | ICD-10-CM

## 2014-03-13 DIAGNOSIS — F1320 Sedative, hypnotic or anxiolytic dependence, uncomplicated: Secondary | ICD-10-CM | POA: Diagnosis present

## 2014-03-13 DIAGNOSIS — F1490 Cocaine use, unspecified, uncomplicated: Secondary | ICD-10-CM

## 2014-03-13 DIAGNOSIS — Z8249 Family history of ischemic heart disease and other diseases of the circulatory system: Secondary | ICD-10-CM

## 2014-03-13 DIAGNOSIS — F1310 Sedative, hypnotic or anxiolytic abuse, uncomplicated: Secondary | ICD-10-CM | POA: Diagnosis present

## 2014-03-13 DIAGNOSIS — F17210 Nicotine dependence, cigarettes, uncomplicated: Secondary | ICD-10-CM | POA: Diagnosis present

## 2014-03-13 DIAGNOSIS — F410 Panic disorder [episodic paroxysmal anxiety] without agoraphobia: Secondary | ICD-10-CM | POA: Diagnosis present

## 2014-03-13 DIAGNOSIS — F1210 Cannabis abuse, uncomplicated: Secondary | ICD-10-CM | POA: Insufficient documentation

## 2014-03-13 DIAGNOSIS — F1110 Opioid abuse, uncomplicated: Secondary | ICD-10-CM | POA: Diagnosis present

## 2014-03-13 DIAGNOSIS — F17200 Nicotine dependence, unspecified, uncomplicated: Secondary | ICD-10-CM | POA: Diagnosis present

## 2014-03-13 DIAGNOSIS — F3162 Bipolar disorder, current episode mixed, moderate: Secondary | ICD-10-CM

## 2014-03-13 DIAGNOSIS — F1010 Alcohol abuse, uncomplicated: Secondary | ICD-10-CM | POA: Diagnosis present

## 2014-03-13 DIAGNOSIS — F319 Bipolar disorder, unspecified: Secondary | ICD-10-CM | POA: Diagnosis present

## 2014-03-13 DIAGNOSIS — F1510 Other stimulant abuse, uncomplicated: Secondary | ICD-10-CM | POA: Diagnosis present

## 2014-03-13 MED ORDER — MAGNESIUM HYDROXIDE 400 MG/5ML PO SUSP: 30.0000 mL | Freq: Every day | ORAL | Status: DC | PRN

## 2014-03-13 MED ORDER — TRAZODONE HCL 50 MG PO TABS
50.0000 mg | ORAL_TABLET | Freq: Every evening | ORAL | Status: DC | PRN
  Administered 2014-03-13 – 2014-03-16 (×5): 50 mg via ORAL
  Filled 2014-03-13 (×14): qty 1

## 2014-03-13 MED ORDER — ADULT MULTIVITAMIN W/MINERALS CH
1.0000 | ORAL_TABLET | Freq: Every day | ORAL | Status: DC
  Administered 2014-03-13 – 2014-03-19 (×7): 1 via ORAL
  Filled 2014-03-13 (×3): qty 1
  Filled 2014-03-13: qty 14
  Filled 2014-03-13 (×5): qty 1

## 2014-03-13 MED ORDER — LITHIUM CARBONATE 300 MG PO CAPS
300.0000 mg | ORAL_CAPSULE | Freq: Two times a day (BID) | ORAL | Status: DC
  Administered 2014-03-13 – 2014-03-19 (×9): 300 mg via ORAL
  Filled 2014-03-13: qty 28
  Filled 2014-03-13 (×11): qty 1
  Filled 2014-03-13: qty 28
  Filled 2014-03-13 (×4): qty 1

## 2014-03-13 MED ORDER — ONDANSETRON 4 MG PO TBDP: 4.0000 mg | ORAL_TABLET | Freq: Four times a day (QID) | ORAL | Status: AC | PRN

## 2014-03-13 MED ORDER — BENZTROPINE MESYLATE 0.5 MG PO TABS
0.5000 mg | ORAL_TABLET | ORAL | Status: DC
  Administered 2014-03-13 – 2014-03-19 (×13): 0.5 mg via ORAL
  Filled 2014-03-13 (×3): qty 1
  Filled 2014-03-13: qty 28
  Filled 2014-03-13 (×4): qty 1
  Filled 2014-03-13: qty 28
  Filled 2014-03-13 (×9): qty 1

## 2014-03-13 MED ORDER — LORAZEPAM 1 MG PO TABS
1.0000 mg | ORAL_TABLET | Freq: Every day | ORAL | Status: AC
  Administered 2014-03-16: 1 mg via ORAL
  Filled 2014-03-13: qty 1

## 2014-03-13 MED ORDER — LORAZEPAM 1 MG PO TABS
1.0000 mg | ORAL_TABLET | Freq: Four times a day (QID) | ORAL | Status: AC
  Administered 2014-03-13 (×2): 1 mg via ORAL
  Filled 2014-03-13 (×2): qty 1

## 2014-03-13 MED ORDER — HALOPERIDOL 5 MG PO TABS
5.0000 mg | ORAL_TABLET | ORAL | Status: DC
  Administered 2014-03-13 – 2014-03-16 (×7): 5 mg via ORAL
  Filled 2014-03-13 (×10): qty 1

## 2014-03-13 MED ORDER — ZIPRASIDONE HCL 20 MG PO CAPS
20.0000 mg | ORAL_CAPSULE | Freq: Two times a day (BID) | ORAL | Status: DC | PRN
  Administered 2014-03-13: 20 mg via ORAL
  Filled 2014-03-13: qty 1

## 2014-03-13 MED ORDER — VITAMIN B-1 100 MG PO TABS
100.0000 mg | ORAL_TABLET | Freq: Every day | ORAL | Status: DC
  Administered 2014-03-14 – 2014-03-19 (×6): 100 mg via ORAL
  Filled 2014-03-13 (×9): qty 1

## 2014-03-13 MED ORDER — NICOTINE 21 MG/24HR TD PT24
21.0000 mg | MEDICATED_PATCH | Freq: Every day | TRANSDERMAL | Status: DC
Start: 2014-03-13 — End: 2014-03-16
  Administered 2014-03-13 – 2014-03-15 (×2): 21 mg via TRANSDERMAL
  Filled 2014-03-13 (×8): qty 1

## 2014-03-13 MED ORDER — LOPERAMIDE HCL 2 MG PO CAPS: 2.0000 mg | ORAL_CAPSULE | ORAL | Status: AC | PRN

## 2014-03-13 MED ORDER — LORAZEPAM 1 MG PO TABS
1.0000 mg | ORAL_TABLET | Freq: Two times a day (BID) | ORAL | Status: AC
  Administered 2014-03-15 (×2): 1 mg via ORAL
  Filled 2014-03-13 (×2): qty 1

## 2014-03-13 MED ORDER — LORAZEPAM 1 MG PO TABS
1.0000 mg | ORAL_TABLET | Freq: Three times a day (TID) | ORAL | Status: AC
  Administered 2014-03-14 (×2): 1 mg via ORAL
  Filled 2014-03-13 (×2): qty 1

## 2014-03-13 MED ORDER — HYDROXYZINE HCL 25 MG PO TABS: 25.0000 mg | ORAL_TABLET | Freq: Four times a day (QID) | ORAL | Status: AC | PRN

## 2014-03-13 MED ORDER — LORAZEPAM 1 MG PO TABS: 1.0000 mg | ORAL_TABLET | Freq: Four times a day (QID) | ORAL | Status: AC | PRN

## 2014-03-13 MED ORDER — ACETAMINOPHEN 325 MG PO TABS
650.0000 mg | ORAL_TABLET | Freq: Four times a day (QID) | ORAL | Status: DC | PRN
  Administered 2014-03-13: 650 mg via ORAL
  Filled 2014-03-13: qty 2

## 2014-03-13 MED ORDER — ALUM & MAG HYDROXIDE-SIMETH 200-200-20 MG/5ML PO SUSP
30.0000 mL | ORAL | Status: DC | PRN
  Administered 2014-03-17: 30 mL via ORAL
  Filled 2014-03-13: qty 30

## 2014-03-13 MED ORDER — THIAMINE HCL 100 MG/ML IJ SOLN
100.0000 mg | Freq: Once | INTRAMUSCULAR | Status: AC
  Administered 2014-03-13: 100 mg via INTRAMUSCULAR
  Filled 2014-03-13: qty 2

## 2014-03-13 NOTE — Discharge Instructions (Signed)
TRANSFER TO [facility]. °
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
## 2014-03-13 NOTE — ED Notes (Signed)
We are still waiting on IVC papers from the magistrate

## 2014-03-13 NOTE — Progress Notes (Signed)
Pt is a 34 year old female admitted with psychotic symptoms   She is hearing voices telling her to kill herself   She was a walkin [facility]   She has hep c and herpes   Pt thinking was tangential and disorganized  She was cooperative during the assessment   She received nourishment and was oriented to the unit   Q 15 min checks initiated   Pt remains safe

## 2014-03-13 NOTE — Progress Notes (Signed)
D: Patient is A&Ox4, denies SI/HI, A/V hallucinations, presented initially as appropriate, but earlier this morning patient started having tangential and disorganzied thoughts, pressured speech, becoming agitated and stated "I just have a bad attitude lately." Patient has continued like this, but is redirectable and is now sleeping in her room.    A: Medications given as scheduled and prn. Emotional support given as needed.  Continue q15 minute checks.  R: Patient remains safe. Patient stated she will let staff know if she feels like hurting herself or someone else.  Aristides Luckey, Wyman SongsterAngela Marie
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
## 2014-03-13 NOTE — Progress Notes (Signed)
Pt was searched and brought back to the unit   Her shoes and belt were put in locker 28   Pt will be admitted by admit RN   She has multiple tatoos but otherwise her skin is unremarkable   She had no contraband in her belongings
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
## 2014-03-13 NOTE — [facility] Group Notes (Signed)
[facility] LCSW Group Therapy  03/13/2014  1:05 PM  Type of Therapy:  Group therapy  Participation Level:  Active  Participation Quality:  Attentive  Affect:  Flat  Cognitive:  Oriented  Insight:  Limited  Engagement in Therapy:  Limited  Modes of Intervention:  Discussion, Socialization  Summary of Progress/Problems:  Chaplain was here to lead a group on themes of hope and courage.  Attended group briefly.  Did not contribute.  Left after 10 minutes. [name], Naelle Diegel B 03/13/2014 1:06 PM

## 2014-03-13 NOTE — Tx Team (Signed)
Initial Interdisciplinary Treatment Plan   PATIENT STRESSORS: Financial difficulties Medication change or noncompliance   PATIENT STRENGTHS: General fund of knowledge Motivation for treatment/growth   PROBLEM LIST: Problem List/Patient Goals Date to be addressed Date deferred Reason deferred Estimated date of resolution  Depression with psychosis            Pt could not say                                           DISCHARGE CRITERIA:  Improved stabilization in mood, thinking, and/or behavior Need for constant or close observation no longer present Verbal commitment to aftercare and medication compliance  PRELIMINARY DISCHARGE PLAN: Attend aftercare/continuing care group Return to previous living arrangement  PATIENT/FAMIILY INVOLVEMENT: This treatment plan has been presented to and reviewed with the patient, Leslie [name], and/or family member, .  The patient and family have been given the opportunity to ask questions and make suggestions.  Andrena Mewsuttall, Ramla Hase J 03/13/2014, 3:37 AM

## 2014-03-13 NOTE — [facility] Group Notes (Signed)
[facility]   03/13/2014 10:46 AM  Participation Quality:  Minimal  Mood/Affect:  Excited  Depression Rating:    Anxiety Rating:    Thoughts of Suicide:  No Will you contract for safety?   NA  Current AVH:  Denies  Plan for Discharge/Comments:  Rambling, tangential thoughts.  "I'm not under the influence."  "Why won't you leave me alone?"  [facility] but was unable to [facility] Haldol[?].  States it was over $5 for co-pay.  My guess is it was not Haldol that she was looking for.  Overheard her talking to another patient about Adderall and Suboxone.  States she has been back and forth between her uncle's house and a friend [place].  Transportation Means:   Supports:  [name], [name]

## 2014-03-13 NOTE — [facility] Suicide Risk Assessment (Signed)
[facility]   Nursing information obtained from:    Demographic factors:    Current Mental Status:    Loss Factors:    Historical Factors:    Risk Reduction Factors:    Total Time spent with patient: 30 minutes Principal Problem: Bipolar disorder, current episode manic severe with psychotic features Diagnosis:   Patient Active Problem List   Diagnosis Date Noted  . Bipolar disorder, current episode manic severe with psychotic features [F31.2] 03/13/2014  . 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 Stimulant use disorder [F15.99] 03/13/2014     Continued Clinical Symptoms:  Alcohol Use Disorder Identification Test Final Score (AUDIT): 3 The "Alcohol Use Disorders Identification Test", Guidelines for Use in Primary Care, Second Edition.  World Science writerHealth Organization [facility](WHO). Score between 0-7:  no or low risk or alcohol related problems. Score between 8-15:  moderate risk of alcohol related problems. Score between 16-19:  high risk of alcohol related problems. Score 20 or above:  warrants further diagnostic evaluation for alcohol dependence and treatment.   CLINICAL FACTORS:   Bipolar Disorder:   Mixed State Alcohol/Substance Abuse/Dependencies   Psychiatric Specialty Exam: Physical Exam  ROS  Blood pressure 102/53, pulse 143, temperature 98 F (36.7 C), temperature source Oral, resp. rate 20, height 5\' 4"  (1.626 m), weight 57.153 kg (126 lb), last menstrual period 02/26/2014.Body mass index is 21.62 kg/(m^2).    Please see H&P.                                                      SUICIDE RISK:   Severe:  Frequent, intense, and enduring suicidal ideation, specific plan, no subjective intent, but some objective markers of intent (i.e., choice of lethal method), the method is accessible, some limited preparatory behavior, evidence of impaired self-control, severe dysphoria/symptomatology, multiple risk factors  present, and few if any protective factors, particularly a lack of social support.  PLAN OF CARE: Please see H&P.   Medical Decision Making:  New problem, with additional work up planned, Review of Psycho-Social Stressors (1), Review or order clinical lab tests (1), Order AIMS Test (2), Established Problem, Worsening (2), Review of Last Therapy Session (1), Review or order medicine tests (1), Review of Medication Regimen & Side Effects (2) and Review of New Medication or Change in Dosage (2)  I certify that inpatient services furnished can reasonably be expected to improve the patient's condition.   Hatem Cull MD 03/13/2014, 11:20 AM

## 2014-03-14 DIAGNOSIS — F1599 Other stimulant use, unspecified with unspecified stimulant-induced disorder: Secondary | ICD-10-CM

## 2014-03-14 DIAGNOSIS — Z9114 Patient's other noncompliance with medication regimen: Secondary | ICD-10-CM

## 2014-03-14 LAB — PREGNANCY, URINE: PREG TEST UR: NEGATIVE

## 2014-03-14 NOTE — [facility] Counselor (Signed)
[facility] Group Notes: (Clinical Social Work)   03/14/2014      Type of Therapy:  Group Therapy   Participation Level:  Did Not Attend despite MHT prompting   Ambrose MantleMareida Grossman-Orr, LCSW 03/14/2014, 12:32 PM

## 2014-03-14 NOTE — Progress Notes (Signed)
Psychoeducational Group Note  Date:  03/14/2014 Time:  2042  Group Topic/Focus:  Wrap-Up Group:   The focus of this group is to help patients review their daily goal of treatment and discuss progress on daily workbooks.  Participation Level: Did Not Attend  Participation Quality:  Not Applicable  Affect:  Not Applicable  Cognitive:  Not Applicable  Insight:  Not Applicable  Engagement in Group: Not Applicable  Additional Comments:  The patient did not attend group this evening as she remained in her bedroom.    Hazle CocaGOODMAN, Skyy Nilan S 03/14/2014, 8:42 PM

## 2014-03-14 NOTE — Progress Notes (Signed)
Patient has continued to rest in bed the majority of the day. Slept through breakfast and lunch (though she was notified when patients were leaving). Patient elected to eat the trays brought back. She has been up for small intervals and requested hygiene items and clothes to be washed. She remains flat and depressed and states once the meds get back into her system the sleepiness will subside. Patient remains safe. Lawrence MarseillesFriedman, Oluwatosin Bracy Eakes

## 2014-03-14 NOTE — Plan of Care (Signed)
Problem: Ineffective individual coping Goal: STG: Patient will remain free from self harm Outcome: Progressing Patient is denying self harm and has not engaged in self harm  Problem: Diagnosis: Increased Risk For Suicide Attempt Goal: STG-Patient Will Comply With Medication Regime Outcome: Progressing Patient has been med compliant thus far.

## 2014-03-14 NOTE — Progress Notes (Signed)
Patient met lying in bed eyes closed. Woke up and requested for [facility]. Cheerful and pleasant. Patient denies pain, SI, AH/VH. Patient made no new complaint. Accepted all her scheduled medications and went back to bed. Safety measures at al times. Will continue to monitor patient.
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
## 2014-03-14 NOTE — [facility] Group Notes (Signed)
[facility] Group Notes:  (Nursing/MHT/Case Management/Adjunct)  Date:  03/14/2014  Time:  9:30am   Type of Therapy:  Nurse Education  Participation Level:  Did Not Attend  Participation Quality:    Affect:    Cognitive:    Insight:    Engagement in Group:    Modes of Intervention:    Summary of Progress/Problems: Patient was invited however remained in bed asleep.  Lawrence MarseillesFriedman, Koni Kannan Eakes 03/14/2014, 12:16 PM

## 2014-03-14 NOTE — Plan of Care (Signed)
Problem: Diagnosis: Increased Risk For Suicide Attempt Goal: STG-Patient Will Attend All Groups On The Unit Outcome: Not Progressing Pt did not attend evening group on 03/14/14.  Problem: Alteration in thought process Goal: STG-Patient does not respond to command hallucinations Outcome: Progressing Pt denies having hallucinations and she does not appear to be responding to internal stimuli.

## 2014-03-14 NOTE — Progress Notes (Signed)
D: Pt has depressed affect and mood.  She has remained in her room for the majority of the shift.  She reports she is "sleepy from the medications."  Pt denies SI/HI, denies hallucinations, denies pain.  Pt did not attend evening group.  Pt denies withdrawal symptoms.   A: PO fluids encouraged and pitcher of Gatorade provided.  Medications administered per order.  Safety maintained.  Met with pt 1:1 and offered support and encouragement.   R: Pt is compliant with medications.  She verbally contracts for safety.  Will continue to monitor and assess for safety.

## 2014-03-14 NOTE — Progress Notes (Addendum)
Patient resting in bed this morning and slept through breakfast. She did come up for morning medications however returned to bed and refused group. She is flat, depressed both in mood and affect. Says that restarting the meds has med her feel sedated and patient is quite sleepy. States she has not heard any voices yet today. No VH/SI/HI. Denies physical problems or pain. Medicated per orders. Offered support and assistance. She remains safe on unit and contracts for safety. Lawrence MarseillesFriedman, Sharone Almond Eakes

## 2014-03-15 DIAGNOSIS — F3162 Bipolar disorder, current episode mixed, moderate: Secondary | ICD-10-CM

## 2014-03-15 NOTE — [facility] Group Notes (Signed)
[facility] Group Notes:  (Nursing/MHT/Case Management/Adjunct)  Date:  03/15/2014  Time:  0930am  Type of Therapy:  Self Inventory Group with RN  Participation Level:  Active  Participation Quality:  Appropriate and Attentive  Affect:  Blunted  Cognitive:  Alert and Appropriate  Insight:  Appropriate  Engagement in Group:  Engaged  Modes of Intervention:  Discussion, Education and Support  Summary of Progress/Problems: Patient attended group, remained engaged and responded appropriately. Pt rates her depression 1/10 and reports "very low" energy level. Pt states "I want to cut down on some of my meds because when I leave here I am having trouble affording them."  Laurine BlazerGuthrie, GrenadaBrittany A 03/15/2014, 10:41 AM

## 2014-03-15 NOTE — [facility] Group Notes (Signed)
[facility] Group Notes:  (Clinical Social Work)  03/15/2014   11:15am-12:00pm  Summary of Progress/Problems:  The main focus of today's process group was to listen to a variety of genres of music and to identify that different types of music provoke different responses.  The patient then was able to identify personally what was soothing for them, as well as energizing.  Handouts were used to record feelings evoked, as well as how patient can personally use this knowledge in sleep habits, with depression, and with other symptoms.  The patient was in and out of the room continuously.  Type of Therapy:  Music Therapy   Participation Level:  Minimal  Participation Quality:  Distracting  Affect:  Blunted  Cognitive:  Alert  Insight:  Limited  Engagement in Therapy:  None  Modes of Intervention:   Activity, Exploration  Ambrose MantleMareida Grossman-Orr, LCSW 03/15/2014, 12:30pm
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
## 2014-03-15 NOTE — Plan of Care (Addendum)
Problem: Alteration in thought process Goal: LTG-Patient has not harmed self or others in at least 2 days Outcome: Completed/Met Date Met:  03/15/14 Pt has not harmed herself or others since admission.

## 2014-03-15 NOTE — Plan of Care (Signed)
Problem: Ineffective individual coping Goal: STG: Patient will remain free from self harm Outcome: Progressing Patient remains free from self harm. 15 minute checks continued per protocol for patient safety.   Problem: Diagnosis: Increased Risk For Suicide Attempt Goal: STG-Patient Will Attend All Groups On The Unit Outcome: Progressing Patient has attended all unit groups today. Goal: STG-Patient Will Comply With Medication Regime Outcome: Progressing Patient has adhered to medication regimen with ease today.

## 2014-03-15 NOTE — Progress Notes (Signed)
Psychoeducational Group Note  Date:  03/15/2014 Time: 1030 Group Topic/Focus:  Making Healthy Choices:   The focus of this group is to help patients identify negative/unhealthy choices they were using prior to admission and identify positive/healthier coping strategies to replace them upon discharge.  Participation Level:  Minimal  Participation Quality:  Attentive  Affect:  Appropriate  Cognitive:  Oriented  Insight:  Engaged  Engagement in Group:  Engaged  Additional Comments:    Rich Braveuke, Gifford Ballon Lynn 4:24 PM. 03/15/2014

## 2014-03-15 NOTE — Progress Notes (Signed)
D: Pt resting in room for the majority of the evening. She reports she is "just sleepy."  Pt denies SI/HI, denies hallucinations, denies pain.  Pt denies having a goal for the day.  Pt has depressed affect and mood.  She has minimal interactions with staff and peers. A: Medications administered per order.  Safety maintained.  Met with pt and offered support and encouragement.   R: Pt is compliant with medications.  Pt verbally contracts for safety.  She reports she will notify staff of needs and concerns.  Will continue to monitor and assess for safety.

## 2014-03-15 NOTE — Progress Notes (Signed)
D: Patient is alert and oriented. Pt's mood and affect is worrisome, anxious, and depressed. Pt's eye contact is fair. [facility] "I can't afford my medication when I leave here." Pt states "I'm worried my kids father's are going to get my kids because I'm this crazy lady." Pt reports she has 3 children. Pt denies SI/HI and AVH at this time. Pt states "I haven't heard any voices since I've been here." Pt is attending groups. Pt denies depression, hopelessness, and anxiety. Pt states this afternoon "It's hard to know who's on your side, if they're a lot prettier than me you can bet they're not on my side." A: Active listening by RN. Pt encouraged to speak with provider and LCSW about financial and medication concerns. Encouragement/Support provided to pt.  Scheduled medications administered per providers orders (See MAR). 15 minute checks continued per protocol for patient safety.  R: Patient cooperative and receptive to nursing interventions. Pt remains safe.

## 2014-03-16 MED ORDER — HALOPERIDOL 5 MG PO TABS
5.0000 mg | ORAL_TABLET | Freq: Every day | ORAL | Status: DC
  Administered 2014-03-17 – 2014-03-19 (×3): 5 mg via ORAL
  Filled 2014-03-16: qty 42
  Filled 2014-03-16 (×4): qty 1

## 2014-03-16 MED ORDER — NICOTINE POLACRILEX 2 MG MT GUM
CHEWING_GUM | OROMUCOSAL | Status: AC
Start: 2014-03-16 — End: 2014-03-17
  Filled 2014-03-16: qty 1

## 2014-03-16 MED ORDER — HALOPERIDOL 5 MG PO TABS
10.0000 mg | ORAL_TABLET | Freq: Every day | ORAL | Status: DC
  Administered 2014-03-16 – 2014-03-18 (×3): 10 mg via ORAL
  Filled 2014-03-16 (×5): qty 2

## 2014-03-16 MED ORDER — NICOTINE POLACRILEX 2 MG MT GUM
2.0000 mg | CHEWING_GUM | OROMUCOSAL | Status: DC | PRN
  Administered 2014-03-16: 2 mg via ORAL

## 2014-03-16 NOTE — Progress Notes (Signed)
D: Pt appears blunted in affect and depressed in mood. Pt also appeared with some irritability and paranoia. " I'm the same as always". Pt was guarded in interaction with Clinical research associatewriter. Pt remained in her room other than receiving her meds or requesting personal care items.. Pt reports trouble sleeping and that she anticipates the need for her additional dose of Trazodone. Pt is denying any SI/HI/AVH. A: Writer administered scheduled medications to pt, per MD orders. Continued support and availability as needed was extended to this pt. Staff continue to monitor pt with q3015min checks.  R: No adverse drug reactions noted. Pt receptive to treatment. Pt remains safe at this time.
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
## 2014-03-16 NOTE — Plan of Care (Signed)
Problem: Ineffective individual coping Goal: STG: Patient will remain free from self harm Outcome: Progressing Patient remains free from self harm. 15 minute checks continued per protocol for patient safety.   Problem: Diagnosis: Increased Risk For Suicide Attempt Goal: STG-Patient Will Attend All Groups On The Unit Outcome: Progressing Patient is attending unit groups today. Goal: STG-Patient Will Comply With Medication Regime Outcome: Progressing Patient has adhered to medication regimen with ease today.  Problem: Alteration in thought process Goal: STG-Patient is able to follow short directions Outcome: Progressing Patient is easily redirectable and is able to follow short directions.

## 2014-03-16 NOTE — Progress Notes (Signed)
D: Patient is alert and oriented. Pt's mood and affect is anxious, depressed, irritable, flat. Pt's speech is pressured, tangential, and disorganized at times. Pt exhibiting paranoia at times. Pt states this morning "everybody here's life is getting better, mines getting worse, I bet his insurance kicked in, I'm just in a bad mood, I keep dreaming about the same people." Pt experiencing hypotension this morning (See docflowsheet-vitals). Pt remains in bed resting the majority of the morning. Pt is attending groups. A: Fluids given, will reassess BP frequently. Active listening by RN. Pt encouraged to remove nicotine patch prior to bed to help reduced bad dreams. Scheduled medications administered per providers orders (See MAR). 15 minute checks continued per protocol for patient safety.  R: Patient cooperative and receptive to nursing interventions. Pt remains safe.

## 2014-03-17 LAB — LITHIUM LEVEL: LITHIUM LVL: 0.34 mmol/L — AB (ref 0.80–1.40)

## 2014-03-17 MED ORDER — NICOTINE 21 MG/24HR TD PT24
21.0000 mg | MEDICATED_PATCH | Freq: Every day | TRANSDERMAL | Status: DC
  Administered 2014-03-17 – 2014-03-19 (×3): 21 mg via TRANSDERMAL
  Filled 2014-03-17 (×2): qty 1
  Filled 2014-03-17: qty 14
  Filled 2014-03-17 (×2): qty 1

## 2014-03-17 MED ORDER — LOPERAMIDE HCL 2 MG PO CAPS
2.0000 mg | ORAL_CAPSULE | ORAL | Status: DC | PRN
  Administered 2014-03-18: 2 mg via ORAL
  Filled 2014-03-17: qty 1

## 2014-03-17 MED ORDER — DIPHENOXYLATE-ATROPINE 2.5-0.025 MG PO TABS
1.0000 | ORAL_TABLET | Freq: Four times a day (QID) | ORAL | Status: DC | PRN
  Administered 2014-03-17: 1 via ORAL
  Filled 2014-03-17: qty 1

## 2014-03-17 MED ORDER — TRAZODONE HCL 100 MG PO TABS
100.0000 mg | ORAL_TABLET | Freq: Every day | ORAL | Status: DC
  Administered 2014-03-17 – 2014-03-18 (×2): 100 mg via ORAL
  Filled 2014-03-17 (×3): qty 1
  Filled 2014-03-17: qty 14
  Filled 2014-03-17: qty 1

## 2014-03-17 MED ORDER — HYDROXYZINE HCL 25 MG PO TABS
25.0000 mg | ORAL_TABLET | Freq: Four times a day (QID) | ORAL | Status: DC | PRN
  Administered 2014-03-17 – 2014-03-18 (×2): 25 mg via ORAL
  Filled 2014-03-17: qty 20
  Filled 2014-03-17 (×2): qty 1

## 2014-03-17 MED ORDER — LITHIUM CARBONATE 300 MG PO CAPS: 300.0000 mg | ORAL_CAPSULE | Freq: Every day | ORAL | Status: DC

## 2014-03-17 NOTE — Progress Notes (Addendum)
Morning Wellness, 09:30 : DID NOT ATTEND  The focus of this group is to educate the patient on the purpose and policies of crisis stabilization and provide a format to answer questions about their admission.  The group details unit policies and expectations of patients while admitted.

## 2014-03-17 NOTE — Progress Notes (Signed)
Patient ID: Leslie [name], female   DOB: 1980-07-20, 34 y.o.   MRN: [id number] D: Patient stated she is doing well but seem irritable when talking to Probation officer. Pt has been in her room most of the evening sleeping. Pt reports she has not had bowel movement and could not given any sample. Pt denies suicidal /homicidal ideation intent and plan. Pt denies auditory and visual hallucination.   A: Met with pt 1:1. Medications administered as prescribed. Writer encouraged pt to discuss feelings. Pt encouraged to come to staff with any questions or concerns.   R: Patient is safe on the unit. She is complaint with medications and denies any adverse reaction.

## 2014-03-17 NOTE — Plan of Care (Signed)
Problem: Ineffective individual coping Goal: STG: Patient will remain free from self harm Outcome: Progressing Patient remains free from self harm. 15 minute checks continued per protocol for patient safety.  Goal: STG:Pt. will utilize relaxation techniques to reduce stress STG: Patient will utilize relaxation techniques to reduce stress levels  Outcome: Progressing Patient rests in room during times of irritability.  Problem: Diagnosis: Increased Risk For Suicide Attempt Goal: STG-Patient Will Attend All Groups On The Unit Outcome: Progressing Patient is attending unit groups today. Goal: STG-Patient Will Comply With Medication Regime Outcome: Progressing Patient has adhered to medication regimen today with ease.
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
## 2014-03-17 NOTE — Progress Notes (Addendum)
D: Patient is alert and oriented. Pt's mood and affect is irritable, angry at times, animated at times, blunted at times. Pt's speech is aggressive and loud at times. Pt's eye contact is brief. Pt denies SI/HI and AVH. Pt complains of diarrhea this morning stating "My stomach has been tore up the whole time I've been here." Pt experiencing hypotension this morning (See docflowsheet-vitals). Pt is attending groups. Pt complains of indigestion this afternoon with some relief from PRN medication. Pt calm in the afternoon, less irritable. Pt reports feeling light headed this evening, BP WDL. 1700 dose of Lithium held per providers orders d/t frequent episodes of diarrhea today. Pt complains of anxiety this evening and is requesting medication, will consult with practitioner for medication. Pt reports this evening that her father called and said the court does not have her letter to miss scheduled court date tomorrow. A: Pt encouraged to increase fluids, fluids given, will reassess vitals frequently. MD, Eappen made aware of pts complaints. Two stool samples collected, not enough stool for third sample, waiting for additional BM. Active listening by RN. Encouragement/Support provided to pt. Per Rod, LCSW will send court letter again. PRN medication administered for diarrhea and indigestion per providers orders (See MAR). Scheduled medications administered per providers orders (See MAR). 15 minute checks continued per protocol for patient safety.  R: Pt easily redirectable. Pt remains safe.
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
## 2014-03-18 LAB — CLOSTRIDIUM DIFFICILE BY PCR: CDIFFPCR: NEGATIVE

## 2014-03-18 NOTE — Progress Notes (Signed)
D: Pt presents with flat affect and depressed mood. Pt has minimal interaction and forwards little information. Pt c/o having three loose stools this morning. Pt given imodium as requested for diarrhea. Pt denies having any suicidal thoughts. Pt denies auditory and visual hallucinations. Pt verbalized that she may need to be on another mood stabilizer if the lithium is causing her to have diarrhea. Pt lithium held this morning, per MD. A: Medications administered as ordered per MD. Verbal support given. Pt encouraged to attend groups as tolerated. Stool sample recollected and placed in proper tubes for ova and parasite lab. Labs sent over via security. 15 minute checks performed for safety.  R: Pt receptive to treatment. Pt calm and cooperative today.

## 2014-03-18 NOTE — [facility] Group Notes (Signed)
[facility] LCSW Group Therapy  03/18/2014 1:22 PM  Type of Therapy:  Group Therapy  Participation Level:  Minimal  Participation Quality:  Attentive  Affect:  Flat   Cognitive:  Alert  Insight:  Limited  Engagement in Therapy:  Limited  Modes of Intervention:  Discussion, Socialization and Support  Summary of Progress/Problems:M[employer] (MHA) speaker came to talk about his personal journey with substance abuse and mental illness. Group members were challenged to process ways by which to relate to the speaker. MHA speaker provided handouts and educational information pertaining to groups and services offered by the [facility]. Leslie [name] attended group and stayed the entire time. She sat quietly and listened to the speaker.    Hyatt,Candace 03/18/2014, 1:22 PM

## 2014-03-18 NOTE — [facility] Group Notes (Signed)
[facility] Note   03/18/2014 11:14 AM  Participation Quality:  Engaged  Mood/Affect:  Appropriate  Depression Rating:  3  Anxiety Rating:  5  States this is her norm when she is thinking about her children  Thoughts of Suicide:  No Will you contract for safety?   NA  Current AVH:  No  Plan for Discharge/Comments:  Leslie [name] confirms that her father got the letter for the court to the proper place.  She is OK with a referral to [facility] for [facility].  Transportation Means: family  Supports: family  Kiribatiorth, Baldo DaubRodney B

## 2014-03-18 NOTE — Plan of Care (Signed)
Problem: Diagnosis: Increased Risk For Suicide Attempt Goal: STG-Patient Will Attend All Groups On The Unit Outcome: Not Progressing Pt in room most of the evening and did not attend evening wrap up group.
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
## 2014-03-19 DIAGNOSIS — F1210 Cannabis abuse, uncomplicated: Secondary | ICD-10-CM | POA: Insufficient documentation

## 2014-03-19 DIAGNOSIS — Z720 Tobacco use: Secondary | ICD-10-CM

## 2014-03-19 DIAGNOSIS — F17200 Nicotine dependence, unspecified, uncomplicated: Secondary | ICD-10-CM | POA: Diagnosis present

## 2014-03-19 LAB — OVA AND PARASITE EXAMINATION: OVA AND PARASITES: NONE SEEN

## 2014-03-19 MED ORDER — BENZTROPINE MESYLATE 0.5 MG PO TABS: 0.5000 mg | ORAL_TABLET | ORAL | Status: DC

## 2014-03-19 MED ORDER — ADULT MULTIVITAMIN W/MINERALS CH: 1.0000 | ORAL_TABLET | Freq: Every day | ORAL | Status: DC

## 2014-03-19 MED ORDER — HYDROXYZINE HCL 25 MG PO TABS: 25.0000 mg | ORAL_TABLET | Freq: Four times a day (QID) | ORAL | Status: DC | PRN

## 2014-03-19 MED ORDER — LITHIUM CARBONATE 300 MG PO CAPS: 300.0000 mg | ORAL_CAPSULE | Freq: Two times a day (BID) | ORAL | Status: DC

## 2014-03-19 MED ORDER — HALOPERIDOL 5 MG PO TABS: 5.0000 mg | ORAL_TABLET | ORAL | Status: DC

## 2014-03-19 MED ORDER — NICOTINE 21 MG/24HR TD PT24: 21.0000 mg | MEDICATED_PATCH | Freq: Every day | TRANSDERMAL | Status: DC

## 2014-03-19 MED ORDER — TRAZODONE HCL 100 MG PO TABS: 100.0000 mg | ORAL_TABLET | Freq: Every day | ORAL | Status: DC

## 2014-03-19 NOTE — Progress Notes (Signed)
  [facility] Discharge Plan :  Will you be returning to the same living situation after discharge:  Yes,  home with uncle At discharge, do you have transportation home?: Yes,  mother Do you have the ability to pay for your medications: Yes,  mental health  Release of information consent forms completed and in the chart;  Patient's signature needed at discharge.  Patient to Follow up at: Follow-up Information    Follow up with [facility].   Why:  They have substance abuse counselors, groups, and a psychiatrist.  Call Noni SaupeJamie Duvall at [phone] to set up your appointment   Contact information:   [address]  [place]  [phone]      Follow up with [facility] On 03/23/2014.   Why:  Monday at 2:00 with Annia Friendlyhester      Also, Wednesday, 2/17 at 10:00 with Bary CastillaShelly Everts for medication management   Contact information:   [address]  [place]  [336] [phone]      Patient denies SI/HI: Yes,  yes    Safety Planning and Suicide Prevention discussed: Yes,  yes  Have you used any form of tobacco in the last 30 days? (Cigarettes, Smokeless Tobacco, Cigars, and/or Pipes): Yes  Has patient been referred to the Quitline?: Patient refused referral  Ida Rogueorth, Aashritha Miedema B 03/19/2014, 11:55 AM

## 2014-03-19 NOTE — Plan of Care (Signed)
Problem: Diagnosis: Increased Risk For Suicide Attempt Goal: STG-Patient Will Comply With Medication Regime Outcome: Progressing Pt is complaint with taking her scheduled medications. Pt reported to the med window at the beginning of shift to request her qhs medications.

## 2014-03-19 NOTE — Progress Notes (Signed)
D: Pt remained in her room this evening. Pt reports that she continues to have nightmares. Pt was encouraged to specifically speak with her psychiatrist about her nightmares to possibly be placed on something as deemed appropriate. Pt was informed that wearing her nicotine patch at night could increase her chances of having a nightmare. Pt removed her patch for writer to discard. Pt had no further concerns verbalized. Pt is currently denying any SI/HI/AVH. Pt is compliant with her medications. A: Writer administered scheduled medications to pt, per MD orders. Continued support and availability as needed was extended to this pt. Staff continue to monitor pt with q1115min checks.  R: No adverse drug reactions noted. Pt receptive to treatment. Pt remains safe at this time.

## 2014-03-19 NOTE — Tx Team (Signed)
  Interdisciplinary Treatment Plan Update   Date Reviewed:  03/19/2014  Time Reviewed:  8:23 AM  Progress in Treatment:   Attending groups: Yes Participating in groups: Yes Taking medication as prescribed: Yes  Tolerating medication: Yes Family/Significant other contact made: Yes  Patient understands diagnosis: Yes  Discussing patient identified problems/goals with staff: Yes  See initial care plan Medical problems stabilized or resolved: Yes Denies suicidal/homicidal ideation: Yes  In tx team Patient has not harmed self or others: Yes  For review of initial/current patient goals, please see plan of care.  Estimated Length of Stay:  D/C today  Reason for Continuation of Hospitalization:   New Problems/Goals identified:  N/A  Discharge Plan or Barriers:   return to uncle's house, follow up outpt  Additional Comments:  Attendees:  Signature: Ivin BootySarama Eappen, MD 03/19/2014 8:23 AM   Signature: Richelle Itood Kaidin Boehle, LCSW 03/19/2014 8:23 AM  Signature: Fransisca KaufmannLaura Davis, NP 03/19/2014 8:23 AM  Signature:Britney Chales Abrahamsyson, RN 03/19/2014 8:23 AM  Signature:  03/19/2014 8:23 AM  Signature:  03/19/2014 8:23 AM  Signature:   03/19/2014 8:23 AM  Signature:    Signature:    Signature:    Signature:    Signature:    Signature:      Scribe for Treatment Team:   Richelle Itood Conner Neiss, LCSW  03/19/2014 8:23 AM

## 2014-03-19 NOTE — [facility] Suicide Risk Assessment (Signed)
[facility]   Demographic Factors:  Caucasian  Total Time spent with patient: 30 minutes  Musculoskeletal: Strength & Muscle Tone: within normal limits Gait & Station: normal Patient leans: N/A  Psychiatric Specialty Exam: Physical Exam  Review of Systems  Constitutional: Negative.   HENT: Negative.   Eyes: Negative.   Respiratory: Negative.   Cardiovascular: Negative.   Gastrointestinal: Negative.  Negative for diarrhea.  Genitourinary: Negative.   Musculoskeletal: Negative.   Skin: Negative.   Neurological: Negative.   Psychiatric/Behavioral: Positive for substance abuse. Negative for depression, suicidal ideas and hallucinations. The patient is not nervous/anxious and does not have insomnia.     Blood pressure 104/53, pulse 96, temperature 97.8 F (36.6 C), temperature source Oral, resp. rate 18, height 5\' 4"  (1.626 m), weight 57.153 kg (126 lb), last menstrual period 02/26/2014.Body mass index is 21.62 kg/(m^2).  General Appearance: Casual  Eye Contact::  Fair  Speech:  Clear and Coherent409  Volume:  Normal  Mood:  Euthymic  Affect:  Congruent  Thought Process:  Linear  Orientation:  Full (Time, Place, and Person)  Thought Content:  WDL  Suicidal Thoughts:  No  Homicidal Thoughts:  No  Memory:  Immediate;   Fair Recent;   Fair Remote;   Fair  Judgement:  Fair  Insight:  Shallow  Psychomotor Activity:  Normal  Concentration:  Fair  Recall:  [employer]  Fund of Knowledge:Fair  Language: Fair  Akathisia:  No  Handed:  Right  AIMS (if indicated):     Assets:  Communication Skills  Sleep:  Number of Hours: 6.75  Cognition: WNL  ADL's:  Intact   Have you used any form of tobacco in the last 30 days? (Cigarettes, Smokeless Tobacco, Cigars, and/or Pipes): Yes  Has this patient used any form of tobacco in the last 30 days? (Cigarettes, Smokeless Tobacco, Cigars, and/or Pipes) Yes, Patient states she prefers nicotine patch.  Mental Status  Per Nursing Assessment::   On Admission:     Current Mental Status by Physician: patient denies SI/HI/AH/VH  Loss Factors: Patient lost custody of children , is unemployed  Historical Factors: Impulsivity  Risk Reduction Factors:   Positive social support  Continued Clinical Symptoms:  Alcohol/Substance Abuse/Dependencies Previous Psychiatric Diagnoses and Treatments  Cognitive Features That Contribute To Risk:  Polarized thinking    Suicide Risk:  Minimal: No identifiable suicidal ideation.  Patients presenting with no risk factors but with morbid ruminations; may be classified as minimal risk based on the severity of the depressive symptoms  Principal Problem: Bipolar disorder, current episode manic severe with psychotic features Discharge Diagnoses:  DSM5:  Primary psychiatric diagnosis:  Bipolar disorder Type I , most recent episode with mixed features (improved)   Secondary psychiatric diagnosis:  Cannabis use disorder, mild Stimulant (cocaine, amphetamines) use disorder,severe  Benzodiazepine (valium) use disorder ,moderate Tobacco use disorder,moderate    Non psychiatric diagnosis:  Herpes per history  Hepatitis C per history.      Patient Active Problem List   Diagnosis Date Noted  . Bipolar disorder, current episode manic severe with psychotic features [F31.2] 03/13/2014  . 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 Stimulant use disorder [F15.99] 03/13/2014      Plan Of Care/Follow-up recommendations:  Activity:  no restrictions Diet:  regular Tests:  Li level in 5 days Other:  follow up with aftercare  Is patient on multiple antipsychotic therapies at discharge:  No   Has Patient had three or more failed trials  of antipsychotic monotherapy by history:  No  Recommended Plan for Multiple Antipsychotic Therapies: NA    Mayte Diers,SarammaMD 03/19/2014, 9:55 AM
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
## 2014-03-19 NOTE — Progress Notes (Signed)
Patient discharged per physician order; patient denies SI/HI and A/V hallucinations; patient received samples, prescriptions, and copy of AVS after it was reviewed; patient had no other questions or concerns at this time; patient left the unit ambulatory

## 2014-03-19 NOTE — [facility] Suicide Risk Assessment (Signed)
[facility] INPATIENT:  Family/Significant Other Suicide Prevention Education  Suicide Prevention Education:  Education Completed; Leslie [name], mother, [phone], who has custody of Leslie [name] children has been identified by the patient as the family member/significant other with whom the patient will be residing, and identified as the person(s) who will aid the patient in the event of a mental health crisis (suicidal ideations/suicide attempt).  With written consent from the patient, the family member/significant other has been provided the following suicide prevention education, prior to the and/or following the discharge of the patient.  The suicide prevention education provided includes the following:  Suicide risk factors  Suicide prevention and interventions  National Suicide Hotline telephone number  [facility] assessment telephone number  [facility] Emergency Assistance 911  [facility] and/or Residential Mobile Crisis Unit telephone number  Request made of family/significant other to:  Remove weapons (e.g., guns, rifles, knives), all items previously/currently identified as safety concern.    Remove drugs/medications (over-the-counter, prescriptions, illicit drugs), all items previously/currently identified as a safety concern.  The family member/significant other verbalizes understanding of the suicide prevention education information provided.  The family member/significant other agrees to remove the items of safety concern listed above. [name] states that Leslie [name] is notorious for not taking her meds and going to follow up appointments.  She is also concerned about Leslie [name]'s relationship with the father of her children as she feels that is probably the main source of Leslie [name]'s drugs.  Leslie [name], Leslie [name] 03/19/2014, 11:01 AM

## 2014-03-22 LAB — STOOL CULTURE

## 2014-03-23 NOTE — Progress Notes (Signed)
Patient Discharge Instructions:  After Visit Summary (AVS):   Faxed to:  03/23/14 Discharge Summary Note:   Faxed to:  03/23/14 Psychiatric Admission Assessment Note:   Faxed to:  03/23/14 Suicide Risk Assessment - Discharge Assessment:   Faxed to:  03/23/14 Faxed/Sent to the Next Level Care provider:  03/23/14 Faxed to [facility] @ [phone] [facility]ax[facility] to [facility] @ [phone]  Jerelene ReddenSheena E Statham, 03/23/2014, 4:00 PM

## 2014-11-15 ENCOUNTER — Ambulatory Visit (HOSPITAL_COMMUNITY): Admission: RE | Admit: 2014-11-15 | Payer: Self-pay | Source: Home / Self Care | Admitting: Psychiatry

## 2014-11-15 ENCOUNTER — Encounter (HOSPITAL_COMMUNITY): Payer: Self-pay | Admitting: *Deleted

## 2014-11-15 ENCOUNTER — Emergency Department (HOSPITAL_COMMUNITY)
Admission: EM | Admit: 2014-11-15 | Discharge: 2014-11-16 | Disposition: A | Discharge status: Home / Self Care | Payer: Federal, State, Local not specified - Other | Attending: Emergency Medicine | Admitting: Emergency Medicine

## 2014-11-15 DIAGNOSIS — F1994 Other psychoactive substance use, unspecified with psychoactive substance-induced mood disorder: Secondary | ICD-10-CM | POA: Diagnosis present

## 2014-11-15 DIAGNOSIS — F1314 Sedative, hypnotic or anxiolytic abuse with sedative, hypnotic or anxiolytic-induced mood disorder: Secondary | ICD-10-CM | POA: Insufficient documentation

## 2014-11-15 DIAGNOSIS — Z87828 Personal history of other (healed) physical injury and trauma: Secondary | ICD-10-CM | POA: Insufficient documentation

## 2014-11-15 DIAGNOSIS — Z79899 Other long term (current) drug therapy: Secondary | ICD-10-CM | POA: Insufficient documentation

## 2014-11-15 DIAGNOSIS — Z720 Tobacco use: Secondary | ICD-10-CM | POA: Insufficient documentation

## 2014-11-15 DIAGNOSIS — F1510 Other stimulant abuse, uncomplicated: Secondary | ICD-10-CM | POA: Diagnosis present

## 2014-11-15 DIAGNOSIS — G478 Other sleep disorders: Secondary | ICD-10-CM | POA: Insufficient documentation

## 2014-11-15 DIAGNOSIS — Z3202 Encounter for pregnancy test, result negative: Secondary | ICD-10-CM | POA: Insufficient documentation

## 2014-11-15 DIAGNOSIS — Z8619 Personal history of other infectious and parasitic diseases: Secondary | ICD-10-CM | POA: Insufficient documentation

## 2014-11-15 DIAGNOSIS — F410 Panic disorder [episodic paroxysmal anxiety] without agoraphobia: Secondary | ICD-10-CM | POA: Insufficient documentation

## 2014-11-15 DIAGNOSIS — F1514 Other stimulant abuse with stimulant-induced mood disorder: Secondary | ICD-10-CM | POA: Insufficient documentation

## 2014-11-15 DIAGNOSIS — F1910 Other psychoactive substance abuse, uncomplicated: Secondary | ICD-10-CM

## 2014-11-15 DIAGNOSIS — F312 Bipolar disorder, current episode manic severe with psychotic features: Secondary | ICD-10-CM | POA: Insufficient documentation

## 2014-11-15 DIAGNOSIS — R443 Hallucinations, unspecified: Secondary | ICD-10-CM

## 2014-11-15 LAB — COMPREHENSIVE METABOLIC PANEL
ALBUMIN: 5.1 g/dL — AB (ref 3.5–5.0)
ALK PHOS: 57 U/L (ref 38–126)
ALT: 44 U/L (ref 14–54)
ANION GAP: 9 (ref 5–15)
AST: 33 U/L (ref 15–41)
BUN: 7 mg/dL (ref 6–20)
CALCIUM: 9.7 mg/dL (ref 8.9–10.3)
CO2: 25 mmol/L (ref 22–32)
Chloride: 102 mmol/L (ref 101–111)
Creatinine, Ser: 0.77 mg/dL (ref 0.44–1.00)
GFR calc Af Amer: >60 mL/min (ref >60)
GFR calc non Af Amer: >60 mL/min (ref >60)
GLUCOSE: 104 mg/dL — AB (ref 65–99)
POTASSIUM: 3.6 mmol/L (ref 3.5–5.1)
SODIUM: 136 mmol/L (ref 135–145)
TOTAL PROTEIN: 9.2 g/dL — AB (ref 6.5–8.1)
Total Bilirubin: 0.7 mg/dL (ref 0.3–1.2)

## 2014-11-15 LAB — CBC
HEMATOCRIT: 43.3 % (ref 36.0–46.0)
HEMOGLOBIN: 14.4 g/dL (ref 12.0–15.0)
MCH: 29.1 pg (ref 26.0–34.0)
MCHC: 33.3 g/dL (ref 30.0–36.0)
MCV: 87.5 fL (ref 78.0–100.0)
Platelets: 376 10*3/uL (ref 150–400)
RBC: 4.95 MIL/uL (ref 3.87–5.11)
RDW: 11.9 % (ref 11.5–15.5)
WBC: 13.7 10*3/uL — AB (ref 4.0–10.5)

## 2014-11-15 LAB — I-STAT BETA HCG BLOOD, ED (MC, WL, AP ONLY): I-stat hCG, quantitative: <5 m[IU]/mL (ref <5)

## 2014-11-15 LAB — SALICYLATE LEVEL: Salicylate Lvl: <4 mg/dL (ref 2.8–30.0)

## 2014-11-15 LAB — ETHANOL: Alcohol, Ethyl (B): <5 mg/dL (ref <5)

## 2014-11-15 LAB — ACETAMINOPHEN LEVEL

## 2014-11-15 MED ORDER — HYDROXYZINE HCL 25 MG PO TABS: 25.0000 mg | ORAL_TABLET | Freq: Four times a day (QID) | ORAL | Status: DC | PRN

## 2014-11-15 MED ORDER — LITHIUM CARBONATE 300 MG PO CAPS
300.0000 mg | ORAL_CAPSULE | Freq: Two times a day (BID) | ORAL | Status: DC
  Administered 2014-11-16: 300 mg via ORAL
  Filled 2014-11-15: qty 1

## 2014-11-15 MED ORDER — IBUPROFEN 200 MG PO TABS: 600.0000 mg | ORAL_TABLET | Freq: Three times a day (TID) | ORAL | Status: DC | PRN

## 2014-11-15 MED ORDER — NICOTINE 21 MG/24HR TD PT24: 21.0000 mg | MEDICATED_PATCH | Freq: Every day | TRANSDERMAL | Status: DC

## 2014-11-15 MED ORDER — ONDANSETRON HCL 4 MG PO TABS: 4.0000 mg | ORAL_TABLET | Freq: Three times a day (TID) | ORAL | Status: DC | PRN

## 2014-11-15 MED ORDER — TRAZODONE HCL 100 MG PO TABS
100.0000 mg | ORAL_TABLET | Freq: Every day | ORAL | Status: DC
  Administered 2014-11-15: 100 mg via ORAL
  Filled 2014-11-15: qty 1

## 2014-11-15 MED ORDER — LORAZEPAM 1 MG PO TABS: 1.0000 mg | ORAL_TABLET | Freq: Three times a day (TID) | ORAL | Status: DC | PRN

## 2014-11-15 MED ORDER — ALUM & MAG HYDROXIDE-SIMETH 200-200-20 MG/5ML PO SUSP: 30.0000 mL | ORAL | Status: DC | PRN

## 2014-11-15 MED ORDER — HALOPERIDOL 5 MG PO TABS: 5.0000 mg | ORAL_TABLET | ORAL | Status: DC

## 2014-11-15 MED ORDER — BENZTROPINE MESYLATE 1 MG PO TABS
0.5000 mg | ORAL_TABLET | ORAL | Status: DC
  Administered 2014-11-15 – 2014-11-16 (×2): 0.5 mg via ORAL
  Filled 2014-11-15 (×2): qty 1

## 2014-11-15 MED ORDER — HALOPERIDOL LACTATE 5 MG/ML IJ SOLN
5.0000 mg | Freq: Once | INTRAMUSCULAR | Status: AC
  Administered 2014-11-15: 5 mg via INTRAMUSCULAR
  Filled 2014-11-15 (×2): qty 1

## 2014-11-15 NOTE — ED Notes (Signed)
Pt ambulatory to room 38 from triage cursing and yelling about using the phone.  Pt reports that she must contact her baby's father to arainge for him to pick their child up. Pt up to the phone after arrival attempting to contact child's father, calmer, supper given.
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
## 2014-11-15 NOTE — ED Notes (Signed)
Pt. Noted sleeping in room. No complaints or concerns voiced. No distress or abnormal behavior noted. Will continue to monitor with security cameras. Q 15 minute rounds continue. 

## 2014-11-15 NOTE — ED Notes (Signed)
Pt states to [employer] and CN she had a little fun with "meth" last night.

## 2014-11-15 NOTE — ED Notes (Signed)
Report received from Lizbeth Bark RN. Pt. Drowsy but arrousable in no acute distress denies SI, HI, AVH and pain.  Pt. Instructed to come to me with problems or concerns.Will continue to monitor for safety via security cameras and Q 15 minute checks.
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
## 2014-11-16 DIAGNOSIS — F1994 Other psychoactive substance use, unspecified with psychoactive substance-induced mood disorder: Secondary | ICD-10-CM | POA: Diagnosis present

## 2014-11-16 DIAGNOSIS — F1510 Other stimulant abuse, uncomplicated: Secondary | ICD-10-CM | POA: Diagnosis present

## 2014-11-16 LAB — RAPID URINE DRUG SCREEN, HOSP PERFORMED
AMPHETAMINES: POSITIVE — AB
BENZODIAZEPINES: POSITIVE — AB
Barbiturates: NOT DETECTED
Cocaine: NOT DETECTED
Opiates: NOT DETECTED
TETRAHYDROCANNABINOL: NOT DETECTED

## 2014-11-16 LAB — LITHIUM LEVEL: Lithium Lvl: 0.72 mmol/L (ref 0.60–1.20)

## 2014-11-16 NOTE — ED Notes (Signed)
Pt. Noted sleeping in room. No complaints or concerns voiced. No distress or abnormal behavior noted. Will continue to monitor with security cameras. Q 15 minute rounds continue. 
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
## 2014-11-16 NOTE — BH Assessment (Signed)
[facility] Assessment Progress Note  Per Thedore Mins, MD, this pt does not require psychiatric hospitalization at this time.  She is to be discharged from [facility] with outpatient substance abuse referrals.  Discharge instructions advise pt to [facility] with [facility].  Pt's nurse, Rayfield Citizen, has been notified.  Doylene Canning, MA Triage Specialist [phone]

## 2014-11-16 NOTE — ED Notes (Signed)
Discharge note:  Patient states she feels much better.  She denies SI/HI/AVH and is eager for discharge home.  She states she lives with her mom and dad.  States her mom is not very supportive.  BP taken and wnl.  Reviewed discharge instructions with patient and recommend she follow up with [facility].  Patient left with all her belongings and wanted to wait for her dad in the lobby.

## 2014-11-16 NOTE — Discharge Instructions (Signed)
To help you maintain a sober lifestyle, a substance abuse treatment program may be beneficial to you.  Contact [facility] to ask about enrolling in their program:       [facility]      [address] [place]      [place], [place] [address]      [phone]      New patients are seen at the walk-in clinic every Tuesday from 9:00 am - 12:00 pm.

## 2014-11-16 NOTE — BHH Suicide Risk Assessment (Signed)
Suicide Risk Assessment  Discharge Assessment   [facility] Discharge Suicide Risk Assessment   Demographic Factors:  Caucasian  Total Time spent with patient: 45 minutes  Musculoskeletal: Strength & Muscle Tone: within normal limits Gait & Station: normal Patient leans: N/A  Psychiatric Specialty Exam: Review of Systems  Constitutional: Negative.   HENT: Negative.   Eyes: Negative.   Respiratory: Negative.   Cardiovascular: Negative.   Gastrointestinal: Negative.   Genitourinary: Negative.   Musculoskeletal: Negative.   Skin: Negative.   Neurological: Negative.   Endo/Heme/Allergies: Negative.   Psychiatric/Behavioral: Positive for substance abuse.    Blood pressure 120/88, pulse 96, temperature 97.9 F (36.6 C), temperature source Oral, resp. rate 16, last menstrual period 10/23/2014, SpO2 97 %.There is no weight on file to calculate BMI.  General Appearance: Casual  Eye Contact::  Good  Speech:  Normal Rate  Volume:  Normal  Mood:  Euthymic  Affect:  Congruent  Thought Process:  Coherent  Orientation:  Full (Time, Place, and Person)  Thought Content:  WDL  Suicidal Thoughts:  No  Homicidal Thoughts:  No  Memory:  Immediate;   Good Recent;   Good Remote;   Good  Judgement:  Fair  Insight:  Fair  Psychomotor Activity:  Normal  Concentration:  Good  Recall:  Good  Fund of Knowledge:Good  Language: Good  Akathisia:  No  Handed:  Right  AIMS (if indicated):     Assets:  Leisure Time Physical Health Resilience Social Support  ADL's:  Intact  Cognition: WNL  Sleep:         Has this patient used any form of tobacco in the last 30 days? (Cigarettes, Smokeless Tobacco, Cigars, and/or Pipes) Yes, A prescription for an FDA-approved tobacco cessation medication was offered at discharge and the patient refused  Mental Status Per Nursing Assessment::   On Admission:   Methamphetamine abuse  Current Mental Status by Physician: NA  Loss Factors: NA  Historical  Factors: NA  Risk Reduction Factors:   Sense of responsibility to family, Living with another person, especially a relative, Positive social support and Positive therapeutic relationship  Continued Clinical Symptoms:  None  Cognitive Features That Contribute To Risk:  None    Suicide Risk:  Minimal: No identifiable suicidal ideation.  Patients presenting with no risk factors but with morbid ruminations; may be classified as minimal risk based on the severity of the depressive symptoms  Principal Problem: Substance induced mood disorder [facility]) Discharge Diagnoses:  Patient Active Problem List   Diagnosis Date Noted  . 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 Substance induced mood disorder (HCC) [F19.94] 11/16/2014    Priority: High  . Tobacco use disorder, moderate, dependence [F17.200] 03/19/2014  . Cannabis use disorder, mild, abuse [F12.10]   . Bipolar disorder, current episode manic severe with psychotic features (HCC) [F31.2] 03/13/2014  . 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 Stimulant use disorder [facility]) [F15.90] 03/13/2014      Plan Of Care/Follow-up recommendations:  Activity:  as tolerated Diet:  heart healthy diet  Is patient on multiple antipsychotic therapies at discharge:  No   Has Patient had three or more failed trials of antipsychotic monotherapy by history:  No  Recommended Plan for Multiple Antipsychotic Therapies: NA    Royden Bulman, PMH-NP 11/16/2014, 10:48 AM

## 2014-12-13 ENCOUNTER — Ambulatory Visit (HOSPITAL_COMMUNITY)
Admission: RE | Admit: 2014-12-13 | Discharge: 2014-12-13 | Disposition: A | Discharge status: Home / Self Care | Payer: Federal, State, Local not specified - Other | Attending: Psychiatry | Admitting: Psychiatry
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
## 2014-12-14 ENCOUNTER — Emergency Department (HOSPITAL_COMMUNITY)
Admission: EM | Admit: 2014-12-14 | Discharge: 2014-12-16 | Disposition: A | Discharge status: Other Acute Inpatient Hospital | Payer: Federal, State, Local not specified - Other | Attending: Emergency Medicine | Admitting: Emergency Medicine

## 2014-12-14 ENCOUNTER — Encounter (HOSPITAL_COMMUNITY): Payer: Self-pay | Admitting: Emergency Medicine

## 2014-12-14 DIAGNOSIS — F1510 Other stimulant abuse, uncomplicated: Secondary | ICD-10-CM | POA: Insufficient documentation

## 2014-12-14 DIAGNOSIS — F1310 Sedative, hypnotic or anxiolytic abuse, uncomplicated: Secondary | ICD-10-CM | POA: Insufficient documentation

## 2014-12-14 DIAGNOSIS — F909 Attention-deficit hyperactivity disorder, unspecified type: Secondary | ICD-10-CM | POA: Insufficient documentation

## 2014-12-14 DIAGNOSIS — Z87828 Personal history of other (healed) physical injury and trauma: Secondary | ICD-10-CM | POA: Insufficient documentation

## 2014-12-14 DIAGNOSIS — Z8619 Personal history of other infectious and parasitic diseases: Secondary | ICD-10-CM | POA: Insufficient documentation

## 2014-12-14 DIAGNOSIS — F410 Panic disorder [episodic paroxysmal anxiety] without agoraphobia: Secondary | ICD-10-CM | POA: Insufficient documentation

## 2014-12-14 DIAGNOSIS — Z79899 Other long term (current) drug therapy: Secondary | ICD-10-CM | POA: Insufficient documentation

## 2014-12-14 DIAGNOSIS — F1590 Other stimulant use, unspecified, uncomplicated: Secondary | ICD-10-CM | POA: Diagnosis present

## 2014-12-14 DIAGNOSIS — Z720 Tobacco use: Secondary | ICD-10-CM | POA: Insufficient documentation

## 2014-12-14 DIAGNOSIS — F312 Bipolar disorder, current episode manic severe with psychotic features: Secondary | ICD-10-CM | POA: Diagnosis present

## 2014-12-14 DIAGNOSIS — R440 Auditory hallucinations: Secondary | ICD-10-CM | POA: Insufficient documentation

## 2014-12-14 DIAGNOSIS — F3112 Bipolar disorder, current episode manic without psychotic features, moderate: Secondary | ICD-10-CM | POA: Insufficient documentation

## 2014-12-14 LAB — COMPREHENSIVE METABOLIC PANEL
ALT: 39 U/L (ref 14–54)
ANION GAP: 10 (ref 5–15)
AST: 30 U/L (ref 15–41)
Albumin: 4.8 g/dL (ref 3.5–5.0)
Alkaline Phosphatase: 72 U/L (ref 38–126)
BUN: 10 mg/dL (ref 6–20)
CHLORIDE: 105 mmol/L (ref 101–111)
CO2: 24 mmol/L (ref 22–32)
CREATININE: 0.85 mg/dL (ref 0.44–1.00)
Calcium: 9.9 mg/dL (ref 8.9–10.3)
Glucose, Bld: 97 mg/dL (ref 65–99)
POTASSIUM: 4.1 mmol/L (ref 3.5–5.1)
SODIUM: 139 mmol/L (ref 135–145)
Total Bilirubin: 0.7 mg/dL (ref 0.3–1.2)
Total Protein: 8.8 g/dL — ABNORMAL HIGH (ref 6.5–8.1)

## 2014-12-14 LAB — CBC WITH DIFFERENTIAL/PLATELET
BASOS ABS: 0.1 10*3/uL (ref 0.0–0.1)
BASOS PCT: 1 %
Eosinophils Absolute: 0.4 10*3/uL (ref 0.0–0.7)
Eosinophils Relative: 2 %
HEMATOCRIT: 43 % (ref 36.0–46.0)
Hemoglobin: 14.7 g/dL (ref 12.0–15.0)
Lymphocytes Relative: 20 %
Lymphs Abs: 3.8 10*3/uL (ref 0.7–4.0)
MCH: 29.6 pg (ref 26.0–34.0)
MCHC: 34.2 g/dL (ref 30.0–36.0)
MCV: 86.5 fL (ref 78.0–100.0)
MONO ABS: 1.1 10*3/uL — AB (ref 0.1–1.0)
Monocytes Relative: 6 %
NEUTROS ABS: 13.1 10*3/uL — AB (ref 1.7–7.7)
Neutrophils Relative %: 71 %
PLATELETS: 446 10*3/uL — AB (ref 150–400)
RBC: 4.97 MIL/uL (ref 3.87–5.11)
RDW: 12.1 % (ref 11.5–15.5)
WBC: 18.5 10*3/uL — AB (ref 4.0–10.5)

## 2014-12-14 LAB — ETHANOL

## 2014-12-14 LAB — LITHIUM LEVEL: LITHIUM LVL: 0.59 mmol/L — AB (ref 0.60–1.20)

## 2014-12-14 MED ORDER — LORAZEPAM 2 MG/ML IJ SOLN
2.0000 mg | Freq: Once | INTRAMUSCULAR | Status: AC
  Administered 2014-12-14: 2 mg via INTRAMUSCULAR
  Filled 2014-12-14: qty 1

## 2014-12-14 MED ORDER — DIPHENHYDRAMINE HCL 50 MG/ML IJ SOLN
50.0000 mg | Freq: Once | INTRAMUSCULAR | Status: AC
  Administered 2014-12-14: 50 mg via INTRAMUSCULAR
  Filled 2014-12-14: qty 1

## 2014-12-14 MED ORDER — ZIPRASIDONE MESYLATE 20 MG IM SOLR
20.0000 mg | Freq: Once | INTRAMUSCULAR | Status: AC
  Administered 2014-12-14: 20 mg via INTRAMUSCULAR
  Filled 2014-12-14: qty 20

## 2014-12-14 NOTE — ED Notes (Signed)
Delay in lab draw, edp in with pt 

## 2014-12-14 NOTE — ED Notes (Signed)
Patient given IM injection without resistance. Encouragement and support provided and safety maintain. Q 15 min safety checks remain in place.
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
## 2014-12-14 NOTE — ED Notes (Signed)
Patient continues have auditory and visual  hallucinations. Patient yelling at people that not there. Patient is anxious, agitated and hostile at this time. Staff is unable to redirect patient at this time. Patient requesting medication for agitation at this time. Encouragement and support provided and safety maintain. Q 15 min safety checks remain in place.
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
## 2014-12-14 NOTE — ED Notes (Addendum)
Donell SievertSpencer Simon, PA informed of patient behavior and telephone orders for Geodon 20 mg IM, Benadryl 50 IM, Ativan 2 mg given by Donell SievertSpencer Simon and verified and read back.

## 2014-12-15 DIAGNOSIS — F1590 Other stimulant use, unspecified, uncomplicated: Secondary | ICD-10-CM

## 2014-12-15 LAB — RAPID URINE DRUG SCREEN, HOSP PERFORMED
AMPHETAMINES: POSITIVE — AB
BENZODIAZEPINES: POSITIVE — AB
Barbiturates: NOT DETECTED
Cocaine: NOT DETECTED
Opiates: NOT DETECTED
TETRAHYDROCANNABINOL: NOT DETECTED

## 2014-12-15 MED ORDER — TRAZODONE HCL 100 MG PO TABS
100.0000 mg | ORAL_TABLET | Freq: Every day | ORAL | Status: DC
  Administered 2014-12-15: 100 mg via ORAL
  Filled 2014-12-15: qty 1

## 2014-12-15 MED ORDER — HALOPERIDOL 5 MG PO TABS: 5.0000 mg | ORAL_TABLET | ORAL | Status: DC

## 2014-12-15 MED ORDER — LORAZEPAM 2 MG/ML IJ SOLN
2.0000 mg | Freq: Once | INTRAMUSCULAR | Status: AC
  Administered 2014-12-15: 2 mg via INTRAMUSCULAR
  Filled 2014-12-15: qty 1

## 2014-12-15 MED ORDER — DIPHENHYDRAMINE HCL 50 MG/ML IJ SOLN
50.0000 mg | Freq: Once | INTRAMUSCULAR | Status: AC
  Administered 2014-12-15: 50 mg via INTRAMUSCULAR
  Filled 2014-12-15: qty 1

## 2014-12-15 MED ORDER — HALOPERIDOL 5 MG PO TABS
5.0000 mg | ORAL_TABLET | Freq: Two times a day (BID) | ORAL | Status: DC
  Administered 2014-12-15 – 2014-12-16 (×3): 5 mg via ORAL
  Filled 2014-12-15 (×3): qty 1

## 2014-12-15 MED ORDER — ZIPRASIDONE MESYLATE 20 MG IM SOLR
10.0000 mg | Freq: Once | INTRAMUSCULAR | Status: AC
  Administered 2014-12-15: 10 mg via INTRAMUSCULAR
  Filled 2014-12-15: qty 20

## 2014-12-15 MED ORDER — HYDROXYZINE HCL 25 MG PO TABS
25.0000 mg | ORAL_TABLET | Freq: Three times a day (TID) | ORAL | Status: DC
  Administered 2014-12-15 – 2014-12-16 (×4): 25 mg via ORAL
  Filled 2014-12-15 (×4): qty 1

## 2014-12-15 MED ORDER — OLANZAPINE 10 MG PO TBDP: 10.0000 mg | ORAL_TABLET | Freq: Three times a day (TID) | ORAL | Status: DC | PRN

## 2014-12-15 MED ORDER — RISPERIDONE 2 MG PO TBDP
2.0000 mg | ORAL_TABLET | Freq: Once | ORAL | Status: AC
  Administered 2014-12-15: 2 mg via ORAL
  Filled 2014-12-15: qty 1

## 2014-12-15 MED ORDER — LITHIUM CARBONATE 300 MG PO CAPS
300.0000 mg | ORAL_CAPSULE | Freq: Two times a day (BID) | ORAL | Status: DC
  Administered 2014-12-15 – 2014-12-16 (×2): 300 mg via ORAL
  Filled 2014-12-15 (×2): qty 1

## 2014-12-15 NOTE — ED Notes (Signed)
Patients behavior is escalating.  She is screaming and crying and trying to leave unit.  Patient is not redirectable.  She is confused and belligerent. Prn orders obtained from MD.

## 2014-12-15 NOTE — ED Notes (Signed)
Patient denies suicidal or homicidal ideation. Patient denies visual hallucinations. Patient is experiencing auditory hallucinations. Encouragement and support provided and safety maintain. Q 15 min safety checks remain in place.

## 2014-12-15 NOTE — ED Notes (Signed)
Patient resting quietly with eyes closed. Respirations equal and unlabored, skin warm and dry. NAD. Q 15 min safety checks remain in place. 

## 2014-12-15 NOTE — BH Assessment (Signed)
[facility] Assessment Progress Note  [facility]  Decision pending as of this writing.  Doylene Canninghomas Jeanenne Licea, MA Triage Specialist [phone]
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
## 2014-12-15 NOTE — ED Notes (Signed)
Pt. Is very agitated and responding to internal stimuli.  She is tearful and verbally aggressive.

## 2014-12-16 ENCOUNTER — Inpatient Hospital Stay (HOSPITAL_COMMUNITY)
Admission: AD | Admit: 2014-12-16 | Discharge: 2014-12-22 | DRG: 885 | Disposition: A | Discharge status: Home / Self Care | Payer: No Typology Code available for payment source | Source: Intra-hospital | Attending: Psychiatry | Admitting: Psychiatry

## 2014-12-16 ENCOUNTER — Encounter (HOSPITAL_COMMUNITY): Payer: Self-pay | Admitting: *Deleted

## 2014-12-16 DIAGNOSIS — Z811 Family history of alcohol abuse and dependence: Secondary | ICD-10-CM | POA: Diagnosis not present

## 2014-12-16 DIAGNOSIS — F1210 Cannabis abuse, uncomplicated: Secondary | ICD-10-CM | POA: Diagnosis not present

## 2014-12-16 DIAGNOSIS — Z818 Family history of other mental and behavioral disorders: Secondary | ICD-10-CM | POA: Diagnosis not present

## 2014-12-16 DIAGNOSIS — E221 Hyperprolactinemia: Secondary | ICD-10-CM | POA: Clinically undetermined

## 2014-12-16 DIAGNOSIS — F312 Bipolar disorder, current episode manic severe with psychotic features: Secondary | ICD-10-CM | POA: Diagnosis not present

## 2014-12-16 DIAGNOSIS — F17210 Nicotine dependence, cigarettes, uncomplicated: Secondary | ICD-10-CM | POA: Diagnosis present

## 2014-12-16 DIAGNOSIS — Z9114 Patient's other noncompliance with medication regimen: Secondary | ICD-10-CM | POA: Diagnosis not present

## 2014-12-16 DIAGNOSIS — Z833 Family history of diabetes mellitus: Secondary | ICD-10-CM | POA: Diagnosis not present

## 2014-12-16 DIAGNOSIS — Z8249 Family history of ischemic heart disease and other diseases of the circulatory system: Secondary | ICD-10-CM | POA: Diagnosis not present

## 2014-12-16 DIAGNOSIS — F1590 Other stimulant use, unspecified, uncomplicated: Secondary | ICD-10-CM | POA: Diagnosis not present

## 2014-12-16 DIAGNOSIS — F410 Panic disorder [episodic paroxysmal anxiety] without agoraphobia: Secondary | ICD-10-CM | POA: Diagnosis present

## 2014-12-16 DIAGNOSIS — F3112 Bipolar disorder, current episode manic without psychotic features, moderate: Secondary | ICD-10-CM | POA: Diagnosis present

## 2014-12-16 DIAGNOSIS — F1390 Sedative, hypnotic, or anxiolytic use, unspecified, uncomplicated: Secondary | ICD-10-CM | POA: Diagnosis not present

## 2014-12-16 DIAGNOSIS — F17200 Nicotine dependence, unspecified, uncomplicated: Secondary | ICD-10-CM | POA: Diagnosis present

## 2014-12-16 DIAGNOSIS — F1320 Sedative, hypnotic or anxiolytic dependence, uncomplicated: Secondary | ICD-10-CM | POA: Diagnosis present

## 2014-12-16 MED ORDER — ALUM & MAG HYDROXIDE-SIMETH 200-200-20 MG/5ML PO SUSP: 30.0000 mL | ORAL | Status: DC | PRN

## 2014-12-16 MED ORDER — OLANZAPINE 10 MG PO TBDP: 10.0000 mg | ORAL_TABLET | Freq: Three times a day (TID) | ORAL | Status: DC | PRN

## 2014-12-16 MED ORDER — LITHIUM CARBONATE 300 MG PO CAPS
300.0000 mg | ORAL_CAPSULE | Freq: Two times a day (BID) | ORAL | Status: DC
  Administered 2014-12-16 – 2014-12-21 (×11): 300 mg via ORAL
  Filled 2014-12-16 (×15): qty 1

## 2014-12-16 MED ORDER — TRAZODONE HCL 100 MG PO TABS
100.0000 mg | ORAL_TABLET | Freq: Every day | ORAL | Status: DC
  Administered 2014-12-16: 100 mg via ORAL
  Filled 2014-12-16 (×3): qty 1

## 2014-12-16 MED ORDER — HALOPERIDOL 5 MG PO TABS
5.0000 mg | ORAL_TABLET | Freq: Two times a day (BID) | ORAL | Status: DC
  Administered 2014-12-16 – 2014-12-20 (×8): 5 mg via ORAL
  Filled 2014-12-16 (×13): qty 1

## 2014-12-16 MED ORDER — MAGNESIUM HYDROXIDE 400 MG/5ML PO SUSP: 30.0000 mL | Freq: Every day | ORAL | Status: DC | PRN

## 2014-12-16 MED ORDER — ACETAMINOPHEN 325 MG PO TABS
650.0000 mg | ORAL_TABLET | Freq: Four times a day (QID) | ORAL | Status: DC | PRN
  Administered 2014-12-21: 650 mg via ORAL
  Filled 2014-12-16 (×2): qty 2

## 2014-12-16 MED ORDER — HYDROXYZINE HCL 25 MG PO TABS
25.0000 mg | ORAL_TABLET | Freq: Three times a day (TID) | ORAL | Status: DC
  Administered 2014-12-16 – 2014-12-17 (×2): 25 mg via ORAL
  Filled 2014-12-16 (×6): qty 1

## 2014-12-16 NOTE — BH Assessment (Signed)
[facility] Assessment Progress Note  Per Thedore MinsMojeed Akintayo, MD, this pt requires psychiatric hospitalization at this time. Berneice Heinrichina Tate, RN, [facility] and upheld by Dr Jannifer FranklinAkintayo.  She refuses to sign Consent to Release Information.  [facility] Pt's nurse, Lincoln MaxinOlivette, has been notified, and agrees to call report to [phone].  Pt is to be transported via [employer].  Doylene Canninghomas Elim Economou, MA Triage Specialist [phone]

## 2014-12-16 NOTE — BH Assessment (Signed)
Patient states that she wants to be discharged back home to her children who are home with her mother. Patient denies SI/HI and AVH. Patient states that she "came in for help and I got the help and I got the help and now I want to go home." Patient states "I do not want to kill Ladene ArtistDerrick I love him." Patient states that she would "never kill anyone it was just rolling off my tongue." Patient states that she feels that "the doctor was rude for not coming in here" and asked if she can go home tomorrow. Patient states that she feels that she is stable and asked continuously if she was stable.   Patient continues to meet inpatient criteria for stabilization per Thedore MinsMojeed Akintayo, MD.  Patient continues under IVC.   Davina PokeJoVea Jafar Poffenberger, LCSW Therapeutic Triage Specialist [facility] 12/16/2014 11:09 AM

## 2014-12-16 NOTE — Progress Notes (Signed)
D: Pt denies SI/HI/AVH. Pt is pleasant and cooperative. Pt denies , pt just tired from all medication earlier, pt does not want to talk much at this time.   A: Pt was offered support and encouragement. Pt was given scheduled medications. Pt was encourage to attend groups. Q 15 minute checks were done for safety.   R:Pt attends groups and interacts well with peers and staff. Pt is taking medication. Pt has no complaints at this time .Pt receptive to treatment and safety maintained on unit.

## 2014-12-17 ENCOUNTER — Encounter (HOSPITAL_COMMUNITY): Payer: Self-pay | Admitting: Psychiatry

## 2014-12-17 LAB — PREGNANCY, URINE: Preg Test, Ur: NEGATIVE

## 2014-12-17 MED ORDER — CHLORDIAZEPOXIDE HCL 25 MG PO CAPS: 25.0000 mg | ORAL_CAPSULE | Freq: Four times a day (QID) | ORAL | Status: AC | PRN

## 2014-12-17 MED ORDER — RAMELTEON 8 MG PO TABS
8.0000 mg | ORAL_TABLET | Freq: Every day | ORAL | Status: DC
  Administered 2014-12-17 – 2014-12-21 (×5): 8 mg via ORAL
  Filled 2014-12-17: qty 1
  Filled 2014-12-17: qty 7
  Filled 2014-12-17 (×5): qty 1

## 2014-12-17 MED ORDER — ADULT MULTIVITAMIN W/MINERALS CH
1.0000 | ORAL_TABLET | Freq: Every day | ORAL | Status: DC
  Administered 2014-12-17 – 2014-12-22 (×6): 1 via ORAL
  Filled 2014-12-17 (×8): qty 1

## 2014-12-17 MED ORDER — CHLORDIAZEPOXIDE HCL 25 MG PO CAPS
25.0000 mg | ORAL_CAPSULE | Freq: Three times a day (TID) | ORAL | Status: AC
  Administered 2014-12-18 – 2014-12-19 (×3): 25 mg via ORAL
  Filled 2014-12-17 (×4): qty 1

## 2014-12-17 MED ORDER — VITAMIN B-1 100 MG PO TABS
100.0000 mg | ORAL_TABLET | Freq: Every day | ORAL | Status: DC
  Administered 2014-12-18 – 2014-12-22 (×5): 100 mg via ORAL
  Filled 2014-12-17 (×7): qty 1

## 2014-12-17 MED ORDER — HYDROXYZINE HCL 25 MG PO TABS
25.0000 mg | ORAL_TABLET | Freq: Four times a day (QID) | ORAL | Status: AC | PRN
  Administered 2014-12-19: 25 mg via ORAL
  Filled 2014-12-17 (×2): qty 1

## 2014-12-17 MED ORDER — THIAMINE HCL 100 MG/ML IJ SOLN
100.0000 mg | Freq: Once | INTRAMUSCULAR | Status: AC
  Administered 2014-12-17: 100 mg via INTRAMUSCULAR
  Filled 2014-12-17: qty 2

## 2014-12-17 MED ORDER — CHLORDIAZEPOXIDE HCL 25 MG PO CAPS
25.0000 mg | ORAL_CAPSULE | Freq: Four times a day (QID) | ORAL | Status: AC
  Administered 2014-12-17 – 2014-12-18 (×4): 25 mg via ORAL
  Filled 2014-12-17 (×3): qty 1

## 2014-12-17 MED ORDER — INFLUENZA VAC SPLIT QUAD 0.5 ML IM SUSY
0.5000 mL | PREFILLED_SYRINGE | INTRAMUSCULAR | Status: AC
  Administered 2014-12-18: 0.5 mL via INTRAMUSCULAR
  Filled 2014-12-17: qty 0.5

## 2014-12-17 MED ORDER — CHLORDIAZEPOXIDE HCL 25 MG PO CAPS
25.0000 mg | ORAL_CAPSULE | ORAL | Status: AC
  Administered 2014-12-19 – 2014-12-20 (×2): 25 mg via ORAL
  Filled 2014-12-17 (×2): qty 1

## 2014-12-17 MED ORDER — LOPERAMIDE HCL 2 MG PO CAPS: 2.0000 mg | ORAL_CAPSULE | ORAL | Status: AC | PRN

## 2014-12-17 MED ORDER — ONDANSETRON 4 MG PO TBDP: 4.0000 mg | ORAL_TABLET | Freq: Four times a day (QID) | ORAL | Status: AC | PRN

## 2014-12-17 MED ORDER — CHLORDIAZEPOXIDE HCL 25 MG PO CAPS
25.0000 mg | ORAL_CAPSULE | Freq: Every day | ORAL | Status: AC
  Administered 2014-12-21: 25 mg via ORAL
  Filled 2014-12-17: qty 1

## 2014-12-17 MED ORDER — NICOTINE 21 MG/24HR TD PT24
21.0000 mg | MEDICATED_PATCH | Freq: Every day | TRANSDERMAL | Status: DC
  Administered 2014-12-18 – 2014-12-22 (×4): 21 mg via TRANSDERMAL
  Filled 2014-12-17 (×8): qty 1

## 2014-12-17 NOTE — Progress Notes (Signed)
D: Pt has depressed affect and mood.  She reports her goal today was to "figure out if I'm going home or not tomorrow."  Pt reports "I just wanna sleep.  I haven't caught up on my sleep."  Pt reports "Montine Circle was supposed to come up and see me but he didn't."  Pt denies SI/HI, denies hallucinations, denies pain.  When she denied having hallucinations, she stated "it feels good not to have those now."  Pt has stayed in her room for the majority of the evening and she did not attend evening group.   A: Introduced self to pt.  Met with pt 1:1 and provided support and encouragement.  Actively listened to pt.  Medications administered per order.   R: Pt is compliant with medications.  Pt verbally contracts for safety and reports that she will notify staff of needs and concerns.  Will continue to monitor and assess.
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
## 2014-12-17 NOTE — [facility] Suicide Risk Assessment (Signed)
[facility]   Nursing information obtained from:    Demographic factors:    Current Mental Status:    Loss Factors:    Historical Factors:    Risk Reduction Factors:    Total Time spent with patient: 30 minutes Principal Problem: Bipolar disorder, current episode manic severe with psychotic features [facility](HCC) Diagnosis:   Patient Active Problem List   Diagnosis Date Noted  . Tobacco use disorder, moderate, dependence [F17.200] 03/19/2014  . Cannabis use disorder, mild, abuse [F12.10]   . Bipolar disorder, current episode manic severe with psychotic features (HCC) [F31.2] 03/13/2014  . 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 Stimulant use disorder [facility](HCC) [F15.90] 03/13/2014     Continued Clinical Symptoms:    The "Alcohol Use Disorders Identification Test", Guidelines for Use in Primary Care, Second Edition.  World Science writerHealth Organization [facility](WHO). Score between 0-7:  no or low risk or alcohol related problems. Score between 8-15:  moderate risk of alcohol related problems. Score between 16-19:  high risk of alcohol related problems. Score 20 or above:  warrants further diagnostic evaluation for alcohol dependence and treatment.   CLINICAL FACTORS:   Alcohol/Substance Abuse/Dependencies Currently Psychotic Unstable or Poor Therapeutic Relationship Previous Psychiatric Diagnoses and Treatments   Musculoskeletal: Strength & Muscle Tone: within normal limits Gait & Station: normal Patient leans: N/A  Psychiatric Specialty Exam: Physical Exam  ROS  Blood pressure 99/73, pulse 110, temperature 98.2 F (36.8 C), temperature source Oral, resp. rate 20, height 5\' 3"  (1.6 m), weight 66.679 kg (147 lb), SpO2 100 %.Body mass index is 26.05 kg/(m^2).          Please see H&P.                                                COGNITIVE FEATURES THAT CONTRIBUTE TO RISK:  Closed-mindedness, Polarized thinking and Thought  constriction (tunnel vision)    SUICIDE RISK:   Moderate:  Frequent suicidal ideation with limited intensity, and duration, some specificity in terms of plans, no associated intent, good self-control, limited dysphoria/symptomatology, some risk factors present, and identifiable protective factors, including available and accessible social support.  PLAN OF CARE: Please see H&P.   Medical Decision Making:  Review of Psycho-Social Stressors (1), Review or order clinical lab tests (1), Review and summation of old records (2), Established Problem, Worsening (2), Review of Last Therapy Session (1), Review of Medication Regimen & Side Effects (2) and Review of New Medication or Change in Dosage (2)  I certify that inpatient services furnished can reasonably be expected to improve the patient's condition.   Akeya Ryther md 12/17/2014, 10:13 AM
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
## 2014-12-17 NOTE — Progress Notes (Signed)
Pt did not attend evening karaoke group because pt was asleep.  

## 2014-12-17 NOTE — Plan of Care (Signed)
Problem: Diagnosis: Increased Risk For Suicide Attempt Goal: STG-Patient Will Comply With Medication Regime Outcome: Progressing Pt has been compliant with medications tonight.       

## 2014-12-17 NOTE — Progress Notes (Signed)
D:Affect is appropriate to mood. Anxious at times.States that she is worried about being and is focused on leaving. When Questioned about her concerns. She couldn't give a clear answer saying her meds are making her tired. Says if she has to stay here she wants some meds changed. A:Support and encouragement offered. R:Receptive. Seems to have settled in as the day progressed and has been participating appropriately. No complaints of pain or problems at this time.

## 2014-12-17 NOTE — [facility] Group Notes (Signed)
[facility] LCSW Group Therapy  12/17/2014 1:15 pm  Type of Therapy: Process Group Therapy  Participation Level:  Active  Participation Quality:  Appropriate  Affect:  Flat  Cognitive:  Oriented  Insight:  Improving  Engagement in Group:  Limited  Engagement in Therapy:  Limited  Modes of Intervention:  Activity, Clarification, Education, Problem-solving and Support  Summary of Progress/Problems: Today's group addressed the issue of overcoming obstacles.  Patients were asked to identify their biggest obstacle post d/c that stands in the way of their on-going success, and then problem solve as to how to manage this.  Presents as drowsy, sedated, slurring her words.  Talked about her children and her boyfriend, and how she feels pulled between the 2.  Limited insight.  Asking when she will be able to be discharged.  [name], Jacquelynn Friend B 12/17/2014   1:48 PM

## 2014-12-18 NOTE — [facility] Group Notes (Signed)
[facility]   12/18/2014 9:40 AM  Participation Quality:  Invited.  Chose to not attend.    [name], Leslie [name]

## 2014-12-18 NOTE — Progress Notes (Signed)
Nursing Note: 0700-1900  D:  Mood is depressed, affect is depressed and flat. " I want to go home to my daughter." Self Inventory: Rates depression 0/10, hopelessness 0/10 and anxiety 0/10.   A:  Pt taking meds as prescribed and Flu vaccine administered. Encouraged to verbalize needs and concerns, active listening and support provided.  Continued Q 15 minute safety checks.  Observed pt. in discharge planning group this am, but did not attend group this afternoon.  R:  Pt. denies A/V hallucinations and is able to verbally contract for safety. Pt mostly found in room lying in bed this shift, minimal interaction noted.

## 2014-12-18 NOTE — [facility] Group Notes (Signed)
[facility] LCSW Group Therapy  12/18/2014  1:05 PM  Type of Therapy:  Group therapy  Participation Level:  Active  Participation Quality:  Attentive  Affect:  Flat  Cognitive:  Oriented  Insight:  Limited  Engagement in Therapy:  Limited  Modes of Intervention:  Discussion, Socialization  Summary of Progress/Problems:  Chaplain was here to lead a group on themes of hope and courage.  Invited.  Chose to not attend.  [name], Nida Manfredi B 12/18/2014 1:40 PM
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
## 2014-12-19 LAB — LIPID PANEL
CHOL/HDL RATIO: 4.7 ratio
CHOLESTEROL: 179 mg/dL (ref 0–200)
HDL: 38 mg/dL — AB (ref >40)
LDL Cholesterol: 101 mg/dL — ABNORMAL HIGH (ref 0–99)
Triglycerides: 198 mg/dL — ABNORMAL HIGH (ref <150)
VLDL: 40 mg/dL (ref 0–40)

## 2014-12-19 LAB — TSH: TSH: 1.135 u[IU]/mL (ref 0.350–4.500)

## 2014-12-19 MED ORDER — OLANZAPINE 10 MG IM SOLR: 10.0000 mg | Freq: Three times a day (TID) | INTRAMUSCULAR | Status: DC | PRN

## 2014-12-19 MED ORDER — OLANZAPINE 10 MG PO TABS
10.0000 mg | ORAL_TABLET | Freq: Three times a day (TID) | ORAL | Status: DC | PRN
  Administered 2014-12-20 – 2014-12-21 (×2): 10 mg via ORAL
  Filled 2014-12-19 (×2): qty 1

## 2014-12-19 NOTE — [facility] Group Notes (Signed)
[facility] Group Notes: (Clinical Social Work)   12/19/2014      Type of Therapy:  Group Therapy   Participation Level:  Did Not Attend despite MHT prompting - came in for about 10 minutes, was agitated and left again   Ambrose MantleMareida Grossman-Orr, LCSW 12/19/2014, 1:19 PM

## 2014-12-19 NOTE — Progress Notes (Signed)
Pt remained in room most of shift after morning aggression episode. Pt continues to stress wanting to be discharged on tomorrow.  Encouraged patient to attend groups and to remain calm. Pt receptive and remains safe on unit with q 15 min checks.

## 2014-12-19 NOTE — Progress Notes (Signed)
D: Pt has depressed affect and mood.  She has stayed in her room tonight.  Pt reports "I haven't been up today, I've been sleeping."  Pt states "hopefully I'm getting out tomorrow, my sisters got a wedding and my daughter is gonna be in it."  Pt reports she feels safe to discharge.  Pt denies SI/HI, denies hallucinations, denies pain, denies withdrawal symptoms.  She did not attend evening group.   A: Met with pt 1:1 and provided support and encouragement.  Actively listened to pt.  Medications administered per order.  PO fluids encouraged and provided.   R: Pt is compliant with medications.  Pt verbally contracts for safety.  Will continue to monitor and assess.

## 2014-12-19 NOTE — Progress Notes (Signed)
Pt extremely agitated and aggressive this am. Pt speaking with doctor, got upset because doctor would not discharge her. Refused to let doctor out of room. AC, and staff nurse intervened. Pt states she wants to go to sisters wedding. Prn given. Pt currently in room resting. Will continue to assess and monitor for safety.

## 2014-12-19 NOTE — Plan of Care (Signed)
Problem: Ineffective individual coping Goal: STG: Patient will remain free from self harm Outcome: Progressing Pt has not harmed herself tonight.  She denies SI and denies thoughts of self-harm.  Pt verbally contracts for safety.

## 2014-12-19 NOTE — Progress Notes (Signed)
Did not attend group 

## 2014-12-19 NOTE — Tx Team (Signed)
Initial Interdisciplinary Treatment Plan   PATIENT STRESSORS: Marital or family conflict   PATIENT STRENGTHS: Average or above average intelligence Communication skills General fund of knowledge   PROBLEM LIST: Problem List/Patient Goals Date to be addressed Date deferred Reason deferred Estimated date of resolution  "make sure I'm okay"  12/19/14     "I'm just depressed"  12/19/14                                                DISCHARGE CRITERIA:  Improved stabilization in mood, thinking, and/or behavior Motivation to continue treatment in a less acute level of care  PRELIMINARY DISCHARGE PLAN: Outpatient therapy  PATIENT/FAMIILY INVOLVEMENT: This treatment plan has been presented to and reviewed with the patient, Gilmore LarocheRenee E Stamour.  The patient and family have been given the opportunity to ask questions and make suggestions.  Arrie AranChurch, Shadana Pry J 12/19/2014, 9:37 PM

## 2014-12-19 NOTE — Plan of Care (Signed)
Problem: Diagnosis: Increased Risk For Suicide Attempt Goal: STG-Patient Will Attend All Groups On The Unit Outcome: Not Progressing Pt did not attend evening group on 12/18/14.
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
## 2014-12-19 NOTE — Plan of Care (Signed)
Problem: Alteration in mood; excessive anxiety as evidenced by: Goal: STG-Pt can identify coping skills to manage panic/anxiety (Patient can identify at least ____ coping skills to manage panic/anxiety attack)  Outcome: Not Progressing Patient had aggressive outburst this shift  Problem: Diagnosis: Increased Risk For Suicide Attempt Goal: STG-Patient Will Comply With Medication Regime Outcome: Progressing Patient med compliant

## 2014-12-19 NOTE — Progress Notes (Signed)
D: Pt has depressed affect and mood.  She has stayed in her room for the majority of the night.  She forwarded little information to [facility].  Pt denies SI/HI, denies hallucinations, denies pain.  Pt did not attend evening group.  She denies having a goal for the day.    A: Met with pt 1:1 and offered support and encouragement.  Medications administered per order.  R: Pt is compliant with medications.  Pt verbally contracts for safety.  Will continue to monitor and assess.

## 2014-12-20 DIAGNOSIS — E221 Hyperprolactinemia: Secondary | ICD-10-CM | POA: Clinically undetermined

## 2014-12-20 LAB — PROLACTIN: Prolactin: 102.2 ng/mL — ABNORMAL HIGH (ref 4.8–23.3)

## 2014-12-20 MED ORDER — BENZTROPINE MESYLATE 0.5 MG PO TABS
0.5000 mg | ORAL_TABLET | Freq: Every evening | ORAL | Status: DC
  Administered 2014-12-20 – 2014-12-21 (×2): 0.5 mg via ORAL
  Filled 2014-12-20 (×4): qty 1

## 2014-12-20 MED ORDER — HALOPERIDOL 5 MG PO TABS
5.0000 mg | ORAL_TABLET | Freq: Every day | ORAL | Status: DC
  Administered 2014-12-21 – 2014-12-22 (×2): 5 mg via ORAL
  Filled 2014-12-20: qty 21
  Filled 2014-12-20 (×4): qty 1

## 2014-12-20 MED ORDER — HALOPERIDOL 5 MG PO TABS
10.0000 mg | ORAL_TABLET | Freq: Every evening | ORAL | Status: DC
  Administered 2014-12-20 – 2014-12-21 (×2): 10 mg via ORAL
  Filled 2014-12-20 (×4): qty 2

## 2014-12-20 MED ORDER — BENZTROPINE MESYLATE 0.5 MG PO TABS
0.5000 mg | ORAL_TABLET | Freq: Every day | ORAL | Status: DC
  Administered 2014-12-21 – 2014-12-22 (×2): 0.5 mg via ORAL
  Filled 2014-12-20 (×4): qty 1
  Filled 2014-12-20 (×2): qty 14

## 2014-12-20 NOTE — [facility] Group Notes (Signed)
[facility] Group Notes:  (Nursing/MHT/Case Management/Adjunct)  Date:  12/20/2014  Time:  11:28 AM  Type of Therapy:  Nurse Education  Participation Level:  Minimal  Participation Quality:  Inattentive and Resistant  Affect:  Irritable  Cognitive:  Oriented  Insight:  Limited  Engagement in Group:  Resistant  Modes of Intervention:  Discussion and Education  Summary of Progress/Problems:  Group topic today was Health Support Systems.  Discussed healthy and not healthy support people.  Discussed making a list of healthy support people and utilize during times of stress.   Leslie [name] attended group for the first part of group.  She stated "I ain't got nothing to say" and left group/never returned.    Norm ParcelHeather V Nilda Keathley 12/20/2014, 11:28 AM

## 2014-12-20 NOTE — [facility] Group Notes (Signed)
[facility] Group Notes:  (Clinical Social Work)  12/20/2014  [facility] Group Notes:  (Clinical Social Work)  12/20/2014  11:00AM-12:00PM  Summary of Progress/Problems:  The main focus of today's process group was to listen to a variety of genres of music and to identify that different types of music provoke different responses.  The patient then was able to identify personally what was soothing for them, as well as energizing.  Handouts were used to record feelings evoked, as well as how patient can personally use this knowledge in sleep habits, with depression, and with other symptoms.  The patient expressed understanding of how each type of music affected her, but did not seem to enjoy much of the music and left the room after about 40 minutes.  Type of Therapy:  Music Therapy   Participation Level:  Active  Participation Quality:  Attentive   Affect:  Blunted  Cognitive:  Oriented  Insight: Improving  Engagement in Therapy: Improving  Modes of Intervention:   Activity, Exploration  Ambrose MantleMareida Grossman-Orr, LCSW 12/20/2014

## 2014-12-20 NOTE — Progress Notes (Signed)
DAR Note: Leslie LandsbergRenee has been in her room most of the morning.  She was out at the nurses station in the morning upset because she missed her sister's wedding yesterday.  "I want to go home now."  Informed her that would be up to the doctor.  She became argumentative and yelling at staff.  She did agree to take a prn of Zyprexa to help with her agitation.  She took AM medications without difficulty.  She attended am group but didn't stay for the whole group.  She denies any SI/HI or A/V hallucination.  She denies any physical complaints.  She appears to be in no physical distress.  She is resting quietly in her room currently.  We will continue to encourage participation in group and unit activities.  Q 15 minute checks maintained for safety.  We will continue to monitor the progress towards her goals.
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
## 2014-12-20 NOTE — Progress Notes (Signed)
D: Pt has depressed affect and mood.  She has stayed in her room for the majority of the night and she forwards very little information to Probation officer.  She reports her day was "okay."  Pt denies SI/HI, denies hallucinations, denies pain.  Pt did not go to evening group.   A:  Met with pt 1:1 and offered support and encouragement.  Medications administered per order.   R: Pt is compliant with medications.  Pt verbally contracts for safety.  Will continue to monitor and assess.

## 2014-12-20 NOTE — Plan of Care (Signed)
Problem: Aggression Towards others,Towards Self, and or Destruction Goal: LTG - No aggression,physical/verbal/destruction prior to D/C (Patient will have no episodes of physical or verbal aggression or property destruction towards self or others for _____ day (s) prior to discharge.)  Outcome: Not Progressing Leslie [name] became loud and verbally aggressive.  Writer was able to talk her into taking PRN for agitation and encouraged her to utilize medications if needed to control her agitation as well as talking to staff.  We will continue to monitor the progress towards her goals.

## 2014-12-21 LAB — LITHIUM LEVEL: LITHIUM LVL: 0.31 mmol/L — AB (ref 0.60–1.20)

## 2014-12-21 LAB — HEMOGLOBIN A1C
HEMOGLOBIN A1C: 5.6 % (ref 4.8–5.6)
Mean Plasma Glucose: 114 mg/dL

## 2014-12-21 MED ORDER — LITHIUM CARBONATE 300 MG PO CAPS
600.0000 mg | ORAL_CAPSULE | Freq: Every day | ORAL | Status: DC
  Administered 2014-12-21: 600 mg via ORAL
  Filled 2014-12-21 (×3): qty 2

## 2014-12-21 MED ORDER — LITHIUM CARBONATE 300 MG PO CAPS
300.0000 mg | ORAL_CAPSULE | Freq: Every morning | ORAL | Status: DC
  Administered 2014-12-22: 300 mg via ORAL
  Filled 2014-12-21: qty 21
  Filled 2014-12-21 (×2): qty 1

## 2014-12-21 NOTE — Progress Notes (Signed)
Nutrition Education Note  RD consulted for nutrition education regarding hyperlipidemia.  Lipid Panel     Component Value Date/Time   CHOL 179 12/19/2014 0635   TRIG 198* 12/19/2014 0635   HDL 38* 12/19/2014 0635   CHOLHDL 4.7 12/19/2014 0635   VLDL 40 12/19/2014 0635   LDLCALC 101* 12/19/2014 0635    RD provided "High Triglycerides Nutrition Therapy" handout from the Academy of [employer]etics. Reviewed patient's dietary recall. Provided examples on ways to decrease sugar and fat intake in diet. Discouraged intake of processed foods and consumption of sugar-sweetened beverages. Encouraged fresh fruits and vegetables as well as whole grain sources of carbohydrates to maximize fiber intake. Teach back method used.  Expect good to fair compliance.  Body mass index is 26.05 kg/(m^2). Pt meets criteria for overweight based on current BMI.  Diet Order: Diet regular Room service appropriate?: Yes; Fluid consistency:: Thin Pt is also offered choice of unit snacks mid-morning and mid-afternoon.  Pt is eating as desired.   Labs and medications reviewed. No further nutrition interventions warranted at this time. If additional nutrition issues arise, please re-consult RD.     Trenton GammonJessica Arlando Leisinger, RD, LDN Inpatient Clinical Dietitian Pager # [phone] After hours/weekend pager # [phone]

## 2014-12-21 NOTE — [facility] Group Notes (Signed)
[facility] Group Notes:  (Counselor/Nursing/MHT/Case Management/Adjunct)  12/21/2014 1:15PM  Type of Therapy:  Group Therapy  Participation Level:  Active  Participation Quality:  Appropriate  Affect:  Flat  Cognitive:  Oriented  Insight:  Improving  Engagement in Group:  Limited  Engagement in Therapy:  Limited  Modes of Intervention:  Discussion, Exploration and Socialization  Summary of Progress/Problems: The topic for group was balance in life.  Pt participated in the discussion about when their life was in balance and out of balance and how this feels.  Pt discussed ways to get back in balance and short term goals they can work on to get where they want to be. Stayed the entire time.  Minimal participation.  Was eating luch during the first part.  When asked about balance, stated she feels most balanced when she is visiting her children.  When asked about the duration of visits and saying goodbye, she became tearful.  When asked, was unable to say what she must do to gain extended visits or even custody.   [name], Jaleen Grupp B 12/21/2014 3:59 PM
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
## 2014-12-21 NOTE — Plan of Care (Signed)
Problem: Aggression Towards others,Towards Self, and or Destruction Goal: STG-Patient will comply with prescribed medication regimen (Patient will comply with prescribed medication regimen)  Outcome: Progressing Pt has been compliant with medications this shift.

## 2014-12-21 NOTE — Progress Notes (Signed)
Pt came out of her room and reported she was unable to sleep because she had a "horrible" nightmare.  Pt was anxious and PRN medication for anxiety was administered.  Pt denies SI/HI, denies hallucinations, denies pain.  She still verbally contracts for safety and reports that she will inform staff of needs and concerns.

## 2014-12-21 NOTE — Progress Notes (Signed)
Pt did not attend wrap-up group today. Pt stayed in bed sleeping.

## 2014-12-21 NOTE — Progress Notes (Signed)
Did not attend group 

## 2014-12-21 NOTE — Progress Notes (Signed)
DAR Note: Leslie LandsbergRenee has been visible on the unit.  Attending groups.  Minimal interaction with peers.  She has been pleasant and cooperative today.  She denies any SI/HI or A/V hallucinations.  She reports that she slept well last night.  She attended all groups today.  She completed her self inventory today and reports that her anxiety, depression and hopelessness are 0/10.  Her goal today is "to see my daughter."  She talked about wanting to go home today but informed her that usually the doctor will see her early in the morning to discuss discharge.  She stated that she knows she isn't going to go home today and is hoping to leave tomorrow.  Informed her to talk with the doctor about her concerns and plan for discharge.  Encouraged continued participation in group and unit activities.  Q 15 minute checks maintained for safety.  We will continue to monitor the progress towards her goals.

## 2014-12-21 NOTE — Progress Notes (Signed)
D: Patient in her room asleep on approach.  Patient states she had a good day.  Patient rates her depression and anxiety both 3/10.  Patient states her goal for today was to see her boyfriend.  Patient states she did not meet her goal because he did not come to visit.  Patient denies SI/HI and denies AVH. A: Staff to monitor Q 15 mins for safety.  Encouragement and support offered.  Scheduled medications administered per orders. R: Patient remains safe on the unit.  Patient did not attend group tonight.  Patient not visible on the unit.  Patient taking administered medications

## 2014-12-22 DIAGNOSIS — F312 Bipolar disorder, current episode manic severe with psychotic features: Principal | ICD-10-CM

## 2014-12-22 DIAGNOSIS — F1210 Cannabis abuse, uncomplicated: Secondary | ICD-10-CM

## 2014-12-22 DIAGNOSIS — E221 Hyperprolactinemia: Secondary | ICD-10-CM

## 2014-12-22 DIAGNOSIS — F17200 Nicotine dependence, unspecified, uncomplicated: Secondary | ICD-10-CM

## 2014-12-22 DIAGNOSIS — F1390 Sedative, hypnotic, or anxiolytic use, unspecified, uncomplicated: Secondary | ICD-10-CM

## 2014-12-22 DIAGNOSIS — F1590 Other stimulant use, unspecified, uncomplicated: Secondary | ICD-10-CM

## 2014-12-22 MED ORDER — LITHIUM CARBONATE 300 MG PO CAPS: 300.0000 mg | ORAL_CAPSULE | Freq: Every morning | ORAL | Status: DC

## 2014-12-22 MED ORDER — HALOPERIDOL 5 MG PO TABS: 5.0000 mg | ORAL_TABLET | Freq: Every day | ORAL | Status: DC

## 2014-12-22 MED ORDER — HALOPERIDOL 10 MG PO TABS: 10.0000 mg | ORAL_TABLET | Freq: Every evening | ORAL | Status: DC

## 2014-12-22 MED ORDER — LITHIUM CARBONATE 600 MG PO CAPS: 600.0000 mg | ORAL_CAPSULE | Freq: Every day | ORAL | Status: DC

## 2014-12-22 MED ORDER — RAMELTEON 8 MG PO TABS: 8.0000 mg | ORAL_TABLET | Freq: Every day | ORAL | Status: DC

## 2014-12-22 MED ORDER — NICOTINE 21 MG/24HR TD PT24: 21.0000 mg | MEDICATED_PATCH | Freq: Every day | TRANSDERMAL | Status: DC

## 2014-12-22 MED ORDER — BENZTROPINE MESYLATE 0.5 MG PO TABS: 0.5000 mg | ORAL_TABLET | ORAL | Status: DC

## 2014-12-22 NOTE — [facility] Suicide Risk Assessment (Signed)
[facility] INPATIENT:  Family/Significant Other Suicide Prevention Education  Suicide Prevention Education:  Education Completed; Leslie [name], mother, [phone] has been identified by the patient as the family member/significant other with whom the patient will be residing, and identified as the person(s) who will aid the patient in the event of a mental health crisis (suicidal ideations/suicide attempt).  With written consent from the patient, the family member/significant other has been provided the following suicide prevention education, prior to the and/or following the discharge of the patient.  The suicide prevention education provided includes the following:  Suicide risk factors  Suicide prevention and interventions  National Suicide Hotline telephone number  [facility] assessment telephone number  [facility] [place] Emergency Assistance 911  [facility] and/or Residential Mobile Crisis Unit telephone number  Request made of family/significant other to:  Remove weapons (e.g., guns, rifles, knives), all items previously/currently identified as safety concern.    Remove drugs/medications (over-the-counter, prescriptions, illicit drugs), all items previously/currently identified as a safety concern.  The family member/significant other verbalizes understanding of the suicide prevention education information provided.  The family member/significant other agrees to remove the items of safety concern listed above.  Leslie [name], Leslie [name] B 12/22/2014, 10:37 AM

## 2014-12-22 NOTE — Progress Notes (Signed)
Patient  discharged home with samples and prescriptions. Patient was stable and appreciative at that time. All papers and prescriptions were given and valuables returned. Verbal understanding expressed. Denies SI/HI and A/VH. Patient given opportunity to express concerns and ask questions.  

## 2014-12-22 NOTE — Progress Notes (Signed)
  [facility] :  Will you be returning to the same living situation after discharge:  Yes,  home At discharge, do you have transportation home?: Yes,  mother Do you have the ability to pay for your medications: Yes,  parents  Release of information consent forms completed and in the chart;  Patient's signature needed at discharge.  Patient to Follow up at: Follow-up Information    [facility]2015.   Why:  Wednesday at 5:30   Contact information:   [address]  [place]  1976      Next level of care provider has [facility] Link:  unknown  Patient denies SI/HI: Yes,  yes    Safety Planning and Suicide Prevention discussed: Yes,  yes  Have you used any form of tobacco in the last 30 days? (Cigarettes, Smokeless Tobacco, Cigars, and/or Pipes): Yes  Has patient been referred to the Quitline?: Patient refused referral  Ida Rogueorth, Jaeanna Mccomber B 12/22/2014, 10:34 AM
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
## 2014-12-22 NOTE — [facility] Suicide Risk Assessment (Signed)
[facility]   Demographic Factors:  Caucasian  Total Time spent with patient: 30 minutes  Musculoskeletal: Strength & Muscle Tone: within normal limits Gait & Station: normal Patient leans: N/A  Psychiatric Specialty Exam: Physical Exam  Review of Systems  Psychiatric/Behavioral: Positive for substance abuse. Negative for depression and suicidal ideas.  All other systems reviewed and are negative.   Blood pressure 106/73, pulse 112, temperature 98.4 F (36.9 C), temperature source Oral, resp. rate 16, height 5\' 3"  (1.6 m), weight 66.679 kg (147 lb), SpO2 100 %.Body mass index is 26.05 kg/(m^2).  General Appearance: Casual  Eye Contact::  Fair  Speech:  Clear and Coherent409  Volume:  Normal  Mood:  Euthymic  Affect:  Appropriate  Thought Process:  Goal Directed  Orientation:  Full (Time, Place, and Person)  Thought Content:  WDL  Suicidal Thoughts:  No  Homicidal Thoughts:  No  Memory:  Immediate;   Fair Recent;   Fair Remote;   Fair  Judgement:  Fair  Insight:  Fair  Psychomotor Activity:  Normal  Concentration:  Fair  Recall:  [employer]iservFair  Fund of Knowledge:Fair  Language: Fair  Akathisia:  No  Handed:  Right  AIMS (if indicated):     Assets:  Communication Skills Desire for Improvement  Sleep:  Number of Hours: 6.5  Cognition: WNL  ADL's:  Intact   Have you used any form of tobacco in the last 30 days? (Cigarettes, Smokeless Tobacco, Cigars, and/or Pipes): Yes  Has this patient used any form of tobacco in the last 30 days? (Cigarettes, Smokeless Tobacco, Cigars, and/or Pipes) Yes, A prescription for an FDA-approved tobacco cessation medication was offered at discharge and the patient refused  Mental Status Per Nursing Assessment::   On Admission:     Current Mental Status by Physician: Pt denies SI/HI/AH/VH  Loss Factors: Decrease in vocational status  Historical Factors: Impulsivity  Risk Reduction Factors:   Responsible for  children under 34 years of age  Continued Clinical Symptoms:  Alcohol/Substance Abuse/Dependencies Unstable or Poor Therapeutic Relationship Previous Psychiatric Diagnoses and Treatments  Cognitive Features That Contribute To Risk:  Closed-mindedness    Suicide Risk:  Minimal: No identifiable suicidal ideation.  Patients presenting with no risk factors but with morbid ruminations; may be classified as minimal risk based on the severity of the depressive symptoms  Principal Problem: Bipolar disorder, current episode manic severe with psychotic features [facility](HCC) Discharge Diagnoses:  Patient Active Problem List   Diagnosis Date Noted  . Hyperprolactinemia (HCC) [E22.1] 12/20/2014  . Tobacco use disorder, moderate, dependence [F17.200] 03/19/2014  . Cannabis use disorder, mild, abuse [F12.10]   . Bipolar disorder, current episode manic severe with psychotic features (HCC) [F31.2] 03/13/2014  . 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 Stimulant use disorder [facility](HCC) [F15.90] 03/13/2014      Plan Of Care/Follow-up recommendations:  Activity:  No restrictions Diet:  regular Tests:  Li level in 5 days , Prolactin level needs to be followed on an out patient basis. Other:  follow up with after care  Is patient on multiple antipsychotic therapies at discharge:  No   Has Patient had three or more failed trials of antipsychotic monotherapy by history:  No  Recommended Plan for Multiple Antipsychotic Therapies: NA    Brecken Dewoody MD 12/22/2014, 9:31 AM
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
## 2014-12-22 NOTE — Plan of Care (Signed)
Problem: Diagnosis: Increased Risk For Suicide Attempt Goal: STG-Patient Will Attend All Groups On The Unit Outcome: Not Progressing Patient did not attend group on the unit tonight     

## 2015-02-28 ENCOUNTER — Ambulatory Visit (HOSPITAL_COMMUNITY)
Admission: AD | Admit: 2015-02-28 | Discharge: 2015-02-28 | Disposition: A | Discharge status: Home / Self Care | Payer: No Typology Code available for payment source | Source: Intra-hospital | Attending: Psychiatry | Admitting: Psychiatry
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
## 2016-01-25 ENCOUNTER — Emergency Department (HOSPITAL_BASED_OUTPATIENT_CLINIC_OR_DEPARTMENT_OTHER)
Admission: EM | Admit: 2016-01-25 | Discharge: 2016-01-25 | Disposition: A | Discharge status: Home / Self Care | Payer: No Typology Code available for payment source

## 2016-05-23 ENCOUNTER — Ambulatory Visit (HOSPITAL_COMMUNITY)
Admission: RE | Admit: 2016-05-23 | Discharge: 2016-05-23 | Disposition: A | Discharge status: Home / Self Care | Payer: No Typology Code available for payment source | Attending: Psychiatry | Admitting: Psychiatry

## 2016-05-23 DIAGNOSIS — F329 Major depressive disorder, single episode, unspecified: Secondary | ICD-10-CM | POA: Insufficient documentation

## 2016-05-23 NOTE — H&P (Signed)
Behavioral Health Medical Screening Exam  Leslie [name] is an 36 y.o. female, presenting to [facility] accompanied with a friend, endorsing guilt, overwhelmed and mild depressive symptoms since being d/c from jail within the last 24 hours. She is denying any despair, SI.SA or HI. She sees Dr Evelene Croon as an out-patient and she has been compliant and tolerating her medications, except during her incarceration. She is denying nay acute ailments of chronic co-morbid conditions  Total Time spent with patient: 20 minutes  Psychiatric Specialty Exam: Physical Exam  Constitutional: She is oriented to person, place, and time. She appears well-developed and well-nourished. No distress.  HENT:  Head: Normocephalic.  Eyes: Pupils are equal, round, and reactive to light.  Respiratory: Effort normal and breath sounds normal.  Neurological: She is alert and oriented to person, place, and time. No cranial nerve deficit.  Skin: Skin is warm. She is not diaphoretic.  Psychiatric: Her speech is normal and behavior is normal. Judgment normal. Thought content is not paranoid. Cognition and memory are normal. She exhibits a depressed mood. She expresses no homicidal and no suicidal ideation.    Review of Systems  Psychiatric/Behavioral: Positive for depression. Negative for hallucinations, substance abuse and suicidal ideas. The patient is not nervous/anxious and does not have insomnia.   All other systems reviewed and are negative.   There were no vitals taken for this visit.There is no height or weight on file to calculate BMI.  General Appearance: Casual  Eye Contact:  Poor  Speech:  Negative  Volume:  Decreased  Mood:  Depressed  Affect:  Congruent  Thought Process:  Goal Directed  Orientation:  Full (Time, Place, and Person)  Thought Content:  Logical  Suicidal Thoughts:  No  Homicidal Thoughts:  No  Memory:  Immediate;   Good  Judgement:  Poor  Insight:  Lacking  Psychomotor Activity:  Negative   Concentration: Concentration: Fair  Recall:  Fair  Fund of Knowledge:Poor  Language: Fair  Akathisia:  Negative  Handed:  Left  AIMS (if indicated):     Assets:  Desire for Improvement  Sleep:       Musculoskeletal: Strength & Muscle Tone: within normal limits Gait & Station: normal Patient leans: N/A  There were no vitals taken for this visit.  Recommendations:  Based on my evaluation the patient does not appear to have an emergency medical condition.  Blessing Zaucha E, PA-C 05/23/2016, 9:05 PM
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
## 2016-06-14 ENCOUNTER — Emergency Department (HOSPITAL_COMMUNITY)
Admission: EM | Admit: 2016-06-14 | Discharge: 2016-06-14 | Disposition: A | Discharge status: Home / Self Care | Payer: Self-pay | Attending: Emergency Medicine | Admitting: Emergency Medicine

## 2016-06-14 ENCOUNTER — Emergency Department (HOSPITAL_COMMUNITY): Payer: Self-pay

## 2016-06-14 ENCOUNTER — Encounter (HOSPITAL_COMMUNITY): Payer: Self-pay | Admitting: Family Medicine

## 2016-06-14 DIAGNOSIS — F909 Attention-deficit hyperactivity disorder, unspecified type: Secondary | ICD-10-CM | POA: Insufficient documentation

## 2016-06-14 DIAGNOSIS — Z79899 Other long term (current) drug therapy: Secondary | ICD-10-CM | POA: Insufficient documentation

## 2016-06-14 DIAGNOSIS — R404 Transient alteration of awareness: Secondary | ICD-10-CM | POA: Insufficient documentation

## 2016-06-14 DIAGNOSIS — F17210 Nicotine dependence, cigarettes, uncomplicated: Secondary | ICD-10-CM | POA: Insufficient documentation

## 2016-06-14 LAB — COMPREHENSIVE METABOLIC PANEL
ALBUMIN: 3.9 g/dL (ref 3.5–5.0)
ALT: 45 U/L (ref 14–54)
AST: 38 U/L (ref 15–41)
Alkaline Phosphatase: 79 U/L (ref 38–126)
Anion gap: 8 (ref 5–15)
BUN: 7 mg/dL (ref 6–20)
CO2: 25 mmol/L (ref 22–32)
Calcium: 8.8 mg/dL — ABNORMAL LOW (ref 8.9–10.3)
Chloride: 104 mmol/L (ref 101–111)
Creatinine, Ser: 0.87 mg/dL (ref 0.44–1.00)
GFR calc Af Amer: >60 mL/min (ref >60)
GFR calc non Af Amer: >60 mL/min (ref >60)
GLUCOSE: 98 mg/dL (ref 65–99)
POTASSIUM: 3.8 mmol/L (ref 3.5–5.1)
Sodium: 137 mmol/L (ref 135–145)
Total Bilirubin: 0.4 mg/dL (ref 0.3–1.2)
Total Protein: 7.6 g/dL (ref 6.5–8.1)

## 2016-06-14 LAB — RAPID URINE DRUG SCREEN, HOSP PERFORMED
Amphetamines: POSITIVE — AB
BARBITURATES: NOT DETECTED
Benzodiazepines: POSITIVE — AB
COCAINE: NOT DETECTED
Opiates: NOT DETECTED
Tetrahydrocannabinol: NOT DETECTED

## 2016-06-14 LAB — CBC
HEMATOCRIT: 36.7 % (ref 36.0–46.0)
Hemoglobin: 12.2 g/dL (ref 12.0–15.0)
MCH: 28.4 pg (ref 26.0–34.0)
MCHC: 33.2 g/dL (ref 30.0–36.0)
MCV: 85.5 fL (ref 78.0–100.0)
Platelets: 392 10*3/uL (ref 150–400)
RBC: 4.29 MIL/uL (ref 3.87–5.11)
RDW: 12.3 % (ref 11.5–15.5)
WBC: 15.7 10*3/uL — ABNORMAL HIGH (ref 4.0–10.5)

## 2016-06-14 LAB — URINALYSIS, ROUTINE W REFLEX MICROSCOPIC
Bilirubin Urine: NEGATIVE
Glucose, UA: NEGATIVE mg/dL
Hgb urine dipstick: NEGATIVE
Ketones, ur: NEGATIVE mg/dL
Nitrite: NEGATIVE
Protein, ur: NEGATIVE mg/dL
Specific Gravity, Urine: 1.003 — ABNORMAL LOW (ref 1.005–1.030)
pH: 7 (ref 5.0–8.0)

## 2016-06-14 LAB — LITHIUM LEVEL: Lithium Lvl: <0.06 mmol/L — ABNORMAL LOW (ref 0.60–1.20)

## 2016-06-14 LAB — ETHANOL: Alcohol, Ethyl (B): <5 mg/dL (ref <5)

## 2016-06-14 LAB — AMMONIA: Ammonia: 24 umol/L (ref 9–35)
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
## 2016-06-14 NOTE — ED Notes (Signed)
Bed: WA01 Expected date:  Expected time:  Means of arrival:  Comments: [facility] 

## 2016-06-14 NOTE — ED Notes (Signed)
Patient was wandering in the hallway and stating she was ready to leave. Dr. Effie ShyWentz is working on discharge instructions but are not ready yet. Removed patients IV incase she decided to leave before discharge papers were ready.

## 2016-06-14 NOTE — Discharge Instructions (Signed)
Take all of your medications as prescribed.  Get plenty of rest, drink a lot of fluids.  Follow-up with your primary care doctor for checkup in 1 week.

## 2016-06-14 NOTE — ED Notes (Signed)
Delay in labs pt in ct

## 2016-06-14 NOTE — ED Notes (Signed)
Patient left before discharge instructions were given. Went to waiting room, but she was no where. Last time seen, was alert, oriented x 3.
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
## 2016-06-14 NOTE — ED Notes (Signed)
Pt in radiology 

## 2016-06-16 LAB — URINE CULTURE

## 2016-07-01 ENCOUNTER — Emergency Department (HOSPITAL_COMMUNITY): Payer: Self-pay

## 2016-07-01 ENCOUNTER — Emergency Department (HOSPITAL_COMMUNITY)
Admission: EM | Admit: 2016-07-01 | Discharge: 2016-07-02 | Disposition: A | Discharge status: Home / Self Care | Payer: No Typology Code available for payment source | Attending: Emergency Medicine | Admitting: Emergency Medicine

## 2016-07-01 ENCOUNTER — Encounter (HOSPITAL_COMMUNITY): Payer: Self-pay | Admitting: Emergency Medicine

## 2016-07-01 DIAGNOSIS — S91312A Laceration without foreign body, left foot, initial encounter: Secondary | ICD-10-CM | POA: Insufficient documentation

## 2016-07-01 DIAGNOSIS — F17210 Nicotine dependence, cigarettes, uncomplicated: Secondary | ICD-10-CM | POA: Insufficient documentation

## 2016-07-01 DIAGNOSIS — Y9301 Activity, walking, marching and hiking: Secondary | ICD-10-CM | POA: Insufficient documentation

## 2016-07-01 DIAGNOSIS — W208XXA Other cause of strike by thrown, projected or falling object, initial encounter: Secondary | ICD-10-CM | POA: Insufficient documentation

## 2016-07-01 DIAGNOSIS — Z79899 Other long term (current) drug therapy: Secondary | ICD-10-CM | POA: Insufficient documentation

## 2016-07-01 DIAGNOSIS — N390 Urinary tract infection, site not specified: Secondary | ICD-10-CM

## 2016-07-01 DIAGNOSIS — R404 Transient alteration of awareness: Secondary | ICD-10-CM | POA: Insufficient documentation

## 2016-07-01 DIAGNOSIS — Y9289 Other specified places as the place of occurrence of the external cause: Secondary | ICD-10-CM | POA: Insufficient documentation

## 2016-07-01 DIAGNOSIS — F319 Bipolar disorder, unspecified: Secondary | ICD-10-CM | POA: Diagnosis present

## 2016-07-01 DIAGNOSIS — F1910 Other psychoactive substance abuse, uncomplicated: Secondary | ICD-10-CM | POA: Insufficient documentation

## 2016-07-01 DIAGNOSIS — Y999 Unspecified external cause status: Secondary | ICD-10-CM | POA: Insufficient documentation

## 2016-07-01 LAB — CBC
HEMATOCRIT: 32.5 % — AB (ref 36.0–46.0)
Hemoglobin: 11.1 g/dL — ABNORMAL LOW (ref 12.0–15.0)
MCH: 28.5 pg (ref 26.0–34.0)
MCHC: 34.2 g/dL (ref 30.0–36.0)
MCV: 83.5 fL (ref 78.0–100.0)
PLATELETS: 384 10*3/uL (ref 150–400)
RBC: 3.89 MIL/uL (ref 3.87–5.11)
RDW: 12.4 % (ref 11.5–15.5)
WBC: 18.1 10*3/uL — AB (ref 4.0–10.5)

## 2016-07-01 LAB — I-STAT BETA HCG BLOOD, ED (MC, WL, AP ONLY): I-stat hCG, quantitative: <5 m[IU]/mL (ref <5)

## 2016-07-01 LAB — BASIC METABOLIC PANEL
ANION GAP: 8 (ref 5–15)
BUN: 9 mg/dL (ref 6–20)
CO2: 23 mmol/L (ref 22–32)
Calcium: 9 mg/dL (ref 8.9–10.3)
Chloride: 107 mmol/L (ref 101–111)
Creatinine, Ser: 0.85 mg/dL (ref 0.44–1.00)
GFR calc Af Amer: >60 mL/min (ref >60)
GFR calc non Af Amer: >60 mL/min (ref >60)
GLUCOSE: 90 mg/dL (ref 65–99)
POTASSIUM: 3.4 mmol/L — AB (ref 3.5–5.1)
Sodium: 138 mmol/L (ref 135–145)

## 2016-07-01 LAB — LITHIUM LEVEL: Lithium Lvl: 0.34 mmol/L — ABNORMAL LOW (ref 0.60–1.20)

## 2016-07-01 LAB — RAPID URINE DRUG SCREEN, HOSP PERFORMED
AMPHETAMINES: POSITIVE — AB
BENZODIAZEPINES: POSITIVE — AB
Barbiturates: NOT DETECTED
Cocaine: NOT DETECTED
Opiates: NOT DETECTED
Tetrahydrocannabinol: POSITIVE — AB

## 2016-07-01 LAB — ETHANOL

## 2016-07-01 MED ORDER — NICOTINE 21 MG/24HR TD PT24
21.0000 mg | MEDICATED_PATCH | Freq: Every day | TRANSDERMAL | Status: DC
  Administered 2016-07-01 – 2016-07-02 (×2): 21 mg via TRANSDERMAL
  Filled 2016-07-01 (×2): qty 1

## 2016-07-01 MED ORDER — LORAZEPAM 2 MG/ML IJ SOLN
1.0000 mg | Freq: Once | INTRAMUSCULAR | Status: AC
  Administered 2016-07-01: 1 mg via INTRAMUSCULAR
  Filled 2016-07-01: qty 1

## 2016-07-01 MED ORDER — CEPHALEXIN 500 MG PO CAPS
500.0000 mg | ORAL_CAPSULE | Freq: Four times a day (QID) | ORAL | Status: DC
  Administered 2016-07-01 – 2016-07-02 (×5): 500 mg via ORAL
  Filled 2016-07-01 (×5): qty 1

## 2016-07-01 MED ORDER — TETANUS-DIPHTH-ACELL PERTUSSIS 5-2.5-18.5 LF-MCG/0.5 IM SUSP
0.5000 mL | Freq: Once | INTRAMUSCULAR | Status: AC
  Administered 2016-07-01: 0.5 mL via INTRAMUSCULAR
  Filled 2016-07-01: qty 0.5

## 2016-07-01 MED ORDER — HALOPERIDOL LACTATE 5 MG/ML IJ SOLN
5.0000 mg | Freq: Once | INTRAMUSCULAR | Status: AC
  Administered 2016-07-01: 5 mg via INTRAMUSCULAR
  Filled 2016-07-01: qty 1

## 2016-07-01 MED ORDER — ZIPRASIDONE MESYLATE 20 MG IM SOLR
20.0000 mg | Freq: Once | INTRAMUSCULAR | Status: DC
  Filled 2016-07-01: qty 20

## 2016-07-01 MED ORDER — DIPHENHYDRAMINE HCL 50 MG/ML IJ SOLN
50.0000 mg | Freq: Once | INTRAMUSCULAR | Status: AC
  Administered 2016-07-01: 50 mg via INTRAMUSCULAR
  Filled 2016-07-01: qty 1

## 2016-07-01 MED ORDER — DOXYCYCLINE HYCLATE 100 MG PO TABS
100.0000 mg | ORAL_TABLET | Freq: Two times a day (BID) | ORAL | Status: DC
  Administered 2016-07-01 – 2016-07-02 (×3): 100 mg via ORAL
  Filled 2016-07-01 (×3): qty 1

## 2016-07-01 MED ORDER — ACETAMINOPHEN 325 MG PO TABS: 650.0000 mg | ORAL_TABLET | ORAL | Status: DC | PRN

## 2016-07-01 MED ORDER — ONDANSETRON HCL 4 MG PO TABS: 4.0000 mg | ORAL_TABLET | Freq: Three times a day (TID) | ORAL | Status: DC | PRN

## 2016-07-01 MED ORDER — LEVOFLOXACIN 750 MG PO TABS
750.0000 mg | ORAL_TABLET | Freq: Every day | ORAL | Status: DC
Start: 2016-07-01 — End: 2016-07-02
  Administered 2016-07-01 – 2016-07-02 (×2): 750 mg via ORAL
  Filled 2016-07-01 (×2): qty 1

## 2016-07-01 MED ORDER — LIDOCAINE-EPINEPHRINE (PF) 2 %-1:200000 IJ SOLN
20.0000 mL | Freq: Once | INTRAMUSCULAR | Status: AC
  Administered 2016-07-01: 20 mL
  Filled 2016-07-01: qty 20

## 2016-07-01 MED ORDER — SILVER SULFADIAZINE 1 % EX CREA
TOPICAL_CREAM | Freq: Once | CUTANEOUS | Status: AC
  Administered 2016-07-01: 12:00:00 via TOPICAL
  Filled 2016-07-01: qty 50

## 2016-07-01 MED ORDER — IBUPROFEN 200 MG PO TABS: 600.0000 mg | ORAL_TABLET | Freq: Three times a day (TID) | ORAL | Status: DC | PRN

## 2016-07-01 MED ORDER — LIDOCAINE-EPINEPHRINE-TETRACAINE (LET) SOLUTION
3.0000 mL | Freq: Once | NASAL | Status: AC
  Administered 2016-07-01: 3 mL via TOPICAL
  Filled 2016-07-01: qty 3

## 2016-07-01 NOTE — ED Notes (Signed)
Patient tearful and cooperative. Released from restraints

## 2016-07-01 NOTE — ED Notes (Signed)
Patient up on her crutches saying her nicotene patch was not working and she needed a cigarette.  Nurse told patient she could not leave and security was called.  MD is taking out IVC papers on patient.  Patient on her crutches yelling at security and nurses.  Patient on crutches due to cut on her foot.
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
## 2016-07-01 NOTE — [facility] Assessment (Signed)
[facility] Assessment Progress Note Case was staffed with [place] NP who recommended a inpatient admission as appropriate bed placement is investigated.

## 2016-07-01 NOTE — ED Notes (Signed)
Left foot wound redressed and covered with 4x4.

## 2016-07-01 NOTE — ED Notes (Signed)
Attempted x 4 to collect lab, unsuccessful. Main lab in route to try

## 2016-07-01 NOTE — ED Notes (Signed)
Bed: [id number] Expected date: 07/01/16 Expected time: 7:25 AM Means of arrival: Ambulance Comments: ?[facility] vs [facility]

## 2016-07-01 NOTE — ED Notes (Addendum)
Pt have 3 pt  Bags, shirt, bra, panties, cotton pants, a MK pocketbook, small traveling pillow, (which are all [place])

## 2016-07-01 NOTE — ED Notes (Signed)
Bed: [facility] Expected date:  Expected time:  Means of arrival:  Comments: No bed.  
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
## 2016-07-01 NOTE — ED Notes (Addendum)
Pt presents with a 3 inch long left foot laceration. bleeding controled . PA made aware. Wound care initiated.

## 2016-07-02 ENCOUNTER — Encounter (HOSPITAL_COMMUNITY): Payer: Self-pay | Admitting: Registered Nurse

## 2016-07-02 DIAGNOSIS — F1910 Other psychoactive substance abuse, uncomplicated: Secondary | ICD-10-CM | POA: Insufficient documentation

## 2016-07-02 DIAGNOSIS — F1290 Cannabis use, unspecified, uncomplicated: Secondary | ICD-10-CM

## 2016-07-02 DIAGNOSIS — Z818 Family history of other mental and behavioral disorders: Secondary | ICD-10-CM | POA: Diagnosis not present

## 2016-07-02 DIAGNOSIS — N390 Urinary tract infection, site not specified: Secondary | ICD-10-CM | POA: Diagnosis not present

## 2016-07-02 DIAGNOSIS — Z811 Family history of alcohol abuse and dependence: Secondary | ICD-10-CM

## 2016-07-02 DIAGNOSIS — F17210 Nicotine dependence, cigarettes, uncomplicated: Secondary | ICD-10-CM

## 2016-07-02 DIAGNOSIS — F319 Bipolar disorder, unspecified: Secondary | ICD-10-CM | POA: Diagnosis present

## 2016-07-02 DIAGNOSIS — F1190 Opioid use, unspecified, uncomplicated: Secondary | ICD-10-CM

## 2016-07-02 DIAGNOSIS — F1390 Sedative, hypnotic, or anxiolytic use, unspecified, uncomplicated: Secondary | ICD-10-CM

## 2016-07-02 DIAGNOSIS — F1490 Cocaine use, unspecified, uncomplicated: Secondary | ICD-10-CM

## 2016-07-02 LAB — COMPREHENSIVE METABOLIC PANEL
ALK PHOS: 56 U/L (ref 38–126)
ALT: 28 U/L (ref 14–54)
AST: 31 U/L (ref 15–41)
Albumin: 3.3 g/dL — ABNORMAL LOW (ref 3.5–5.0)
Anion gap: 9 (ref 5–15)
BILIRUBIN TOTAL: 0.5 mg/dL (ref 0.3–1.2)
BUN: 9 mg/dL (ref 6–20)
CALCIUM: 8.7 mg/dL — AB (ref 8.9–10.3)
CO2: 23 mmol/L (ref 22–32)
Chloride: 108 mmol/L (ref 101–111)
Creatinine, Ser: 0.88 mg/dL (ref 0.44–1.00)
Glucose, Bld: 85 mg/dL (ref 65–99)
Potassium: 3.7 mmol/L (ref 3.5–5.1)
Sodium: 140 mmol/L (ref 135–145)
Total Protein: 6.9 g/dL (ref 6.5–8.1)

## 2016-07-02 LAB — CBC WITH DIFFERENTIAL/PLATELET
BASOS PCT: 1 %
Basophils Absolute: 0.1 10*3/uL (ref 0.0–0.1)
EOS ABS: 0.5 10*3/uL (ref 0.0–0.7)
Eosinophils Relative: 5 %
HEMATOCRIT: 33.2 % — AB (ref 36.0–46.0)
HEMOGLOBIN: 10.8 g/dL — AB (ref 12.0–15.0)
Lymphocytes Relative: 22 %
Lymphs Abs: 2 10*3/uL (ref 0.7–4.0)
MCH: 28.3 pg (ref 26.0–34.0)
MCHC: 32.5 g/dL (ref 30.0–36.0)
MCV: 86.9 fL (ref 78.0–100.0)
MONO ABS: 0.4 10*3/uL (ref 0.1–1.0)
MONOS PCT: 4 %
Neutro Abs: 6.3 10*3/uL (ref 1.7–7.7)
Neutrophils Relative %: 68 %
Platelets: 359 10*3/uL (ref 150–400)
RBC: 3.82 MIL/uL — ABNORMAL LOW (ref 3.87–5.11)
RDW: 12.8 % (ref 11.5–15.5)
WBC: 9.2 10*3/uL (ref 4.0–10.5)

## 2016-07-02 LAB — TSH: TSH: 0.742 u[IU]/mL (ref 0.350–4.500)

## 2016-07-02 MED ORDER — LEVOFLOXACIN 750 MG PO TABS: 750.0000 mg | ORAL_TABLET | Freq: Every day | ORAL | 0 refills | Status: DC

## 2016-07-02 MED ORDER — SULFAMETHOXAZOLE-TRIMETHOPRIM 800-160 MG PO TABS: 1.0000 | ORAL_TABLET | Freq: Two times a day (BID) | ORAL | 0 refills | Status: DC

## 2016-07-02 MED ORDER — HALOPERIDOL 10 MG PO TABS: 10.0000 mg | ORAL_TABLET | Freq: Every day | ORAL | 0 refills | Status: DC

## 2016-07-02 MED ORDER — HALOPERIDOL 5 MG PO TABS: 10.0000 mg | ORAL_TABLET | Freq: Every day | ORAL | Status: DC

## 2016-07-02 MED ORDER — SILVER SULFADIAZINE 1 % EX CREA: 1.0000 "application " | TOPICAL_CREAM | Freq: Every day | CUTANEOUS | 0 refills | Status: DC

## 2016-07-02 MED ORDER — LITHIUM CARBONATE 300 MG PO CAPS
300.0000 mg | ORAL_CAPSULE | ORAL | Status: DC
  Administered 2016-07-02: 300 mg via ORAL
  Filled 2016-07-02: qty 1

## 2016-07-02 MED ORDER — BENZTROPINE MESYLATE 0.5 MG PO TABS: 0.5000 mg | ORAL_TABLET | Freq: Two times a day (BID) | ORAL | Status: DC

## 2016-07-02 MED ORDER — CEPHALEXIN 500 MG PO CAPS: 500.0000 mg | ORAL_CAPSULE | Freq: Four times a day (QID) | ORAL | 0 refills | Status: DC

## 2016-07-02 MED ORDER — LITHIUM CARBONATE 300 MG PO CAPS: 600.0000 mg | ORAL_CAPSULE | Freq: Every day | ORAL | Status: DC

## 2016-07-02 MED ORDER — DOXYCYCLINE HYCLATE 100 MG PO CAPS: 100.0000 mg | ORAL_CAPSULE | Freq: Two times a day (BID) | ORAL | 0 refills | Status: DC

## 2016-07-02 NOTE — Discharge Instructions (Signed)
Cleaner laceration twice a day gently with soap and water. Apply the Silvadene daily. Follow-up with Dr. Lajoyce Cornersuda this week

## 2016-07-02 NOTE — Progress Notes (Signed)
CSW contacted by patient's cousin Andree Moro(Andy Gregory [phone]), patient's cousin confirmed that patient is coming to reside with him and reported that he has no concerns with patient coming to his home. Patient's cousin reported that he can be contacted when patient is ready for discharge to provide transportation. CSW informed psychiatrist.   Celso SickleKimberly Sherene Plancarte, LCSWA [facility]  Clinical Social Worker [phone]

## 2016-07-02 NOTE — [facility] Suicide Risk Assessment (Cosign Needed)
Suicide Risk Assessment  Discharge Assessment   [facility]   Principal Problem: Bipolar disorder without psychotic features [facility](HCC) Discharge Diagnoses:  Patient Active Problem List   Diagnosis Date Noted  . Bipolar disorder without psychotic features (HCC) [F31.9] 07/02/2016  . UTI (urinary tract infection) [N39.0] 07/02/2016  . Polysubstance abuse [F19.10]   . Hyperprolactinemia (HCC) [E22.1] 12/20/2014  . Tobacco use disorder, moderate, dependence [F17.200] 03/19/2014  . Cannabis use disorder, mild, abuse [F12.10]   . Bipolar disorder, current episode manic severe with psychotic features (HCC) [F31.2] 03/13/2014  . 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 Stimulant use disorder (HCC) [F15.90] 03/13/2014    Total Time spent with patient: 30 minutes  Musculoskeletal: Strength & Muscle Tone: within normal limits Gait & Station: normal Patient leans: N/A  Psychiatric Specialty Exam:  Physical Exam  Nursing note and vitals reviewed. Constitutional: She is oriented to person, place, and time.  Neck: Normal range of motion.  Musculoskeletal: Normal range of motion.  Neurological: She is alert and oriented to person, place, and time.  Skin:  Laceration on sole of left foot covered with bandages     Review of Systems  Psychiatric/Behavioral: Negative for depression and suicidal ideas (THC). Hallucinations: Denies at this time. Substance abuse: THC.  All other systems reviewed and are negative.   Blood pressure (!) 96/54, pulse (!) 112, temperature 98.6 F (37 C), temperature source Oral, resp. rate 20, height 5\' 4"  (1.626 m), weight 74.4 kg (164 lb), last menstrual period 06/05/2016, SpO2 96 %.Body mass index is 28.15 kg/m.  General Appearance: Casual and Fairly Groomed  Eye Contact:  Good  Speech:  Clear and Coherent and Normal Rate  Volume:  Normal  Mood:  "Good"  Affect:  Appropriate  Thought Process:  Goal Directed  and Descriptions of Associations: Intact  Orientation:  Full (Time, Place, and Person)  Thought Content:  Logical  Suicidal Thoughts:  No  Homicidal Thoughts:  No  Memory:  Immediate;   Good Recent;   Good Remote;   Good  Judgement:  Fair  Insight:  Fair  Psychomotor Activity:  Normal  Concentration:  Concentration: Good and Attention Span: Good  Recall:  Good  Fund of Knowledge:  Fair  Language:  Good  Akathisia:  No  Handed:  Right  AIMS (if indicated):     Assets:  Communication Skills Desire for Improvement Housing Social Support  ADL's:  Intact  Cognition:  WNL  Sleep:       Mental Status Per Nursing Assessment::   On Admission:   Hallucinations  Demographic Factors:  Caucasian  Loss Factors: NA  Historical Factors: Impulsivity  Risk Reduction Factors:   Living with another person, especially a relative, Positive social support and Positive therapeutic relationship  Continued Clinical Symptoms:  Alcohol/Substance Abuse/Dependencies Previous Psychiatric Diagnoses and Treatments  Cognitive Features That Contribute To Risk:  None    Suicide Risk:  Minimal: No identifiable suicidal ideation.  Patients presenting with no risk factors but with morbid ruminations; may be classified as minimal risk based on the severity of the depressive symptoms    Plan Of Care/Follow-up recommendations:  Activity:  As tolerated Diet:  Heart healthy  Follow up with Dr Legrand RamsKaur  Heiress Williamson, NP 07/02/2016, 2:21 PM

## 2016-07-02 NOTE — Progress Notes (Signed)
CSW filed patient's notice of commitment change paperwork into IVC logbook.   Thang Flett, LCSWA [facility]  Clinical Social Worker [phone] 

## 2016-07-02 NOTE — Progress Notes (Signed)
Pt discharged to home. Dc instructions given. No concerns voiced. Prescriptions given for antibiotics, silvadene and haldol. No concerns voiced. Pt encouraged to call and schedule f/u appt with MD. Voiced understanding. Pt left unit in wheelchair pushed by this writer to meet ride outside. Left in white bmw and in stable condition. Derinda SisVera Sheral Pfahler,rn.
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
## 2016-07-02 NOTE — Progress Notes (Signed)
Per psychiatrist Hisada request, CSW contacted patient's mom to retrieve patient's cousin's Andy's phone number to confirm patient's ability to reside with cousin. Patient's mother reported that patient's cousin's phone number is [phone]. CSW contacted patient's cousin, no answer. CSW left voicemail requesting return phone call, psychiatrist informed.   Celso SickleKimberly Gwenlyn Hottinger, LCSWA [facility]  Clinical Social Worker [phone]

## 2016-07-03 ENCOUNTER — Encounter (HOSPITAL_COMMUNITY): Payer: Self-pay | Admitting: *Deleted

## 2016-07-03 ENCOUNTER — Inpatient Hospital Stay (HOSPITAL_COMMUNITY)
Admission: AD | Admit: 2016-07-03 | Discharge: 2016-07-07 | DRG: 885 | Disposition: A | Discharge status: Home / Self Care | Payer: No Typology Code available for payment source | Attending: Psychiatry | Admitting: Psychiatry

## 2016-07-03 DIAGNOSIS — F312 Bipolar disorder, current episode manic severe with psychotic features: Principal | ICD-10-CM | POA: Diagnosis present

## 2016-07-03 DIAGNOSIS — F17210 Nicotine dependence, cigarettes, uncomplicated: Secondary | ICD-10-CM | POA: Diagnosis present

## 2016-07-03 DIAGNOSIS — S91302A Unspecified open wound, left foot, initial encounter: Secondary | ICD-10-CM | POA: Diagnosis present

## 2016-07-03 DIAGNOSIS — F209 Schizophrenia, unspecified: Secondary | ICD-10-CM | POA: Diagnosis present

## 2016-07-03 DIAGNOSIS — Z8619 Personal history of other infectious and parasitic diseases: Secondary | ICD-10-CM | POA: Diagnosis not present

## 2016-07-03 DIAGNOSIS — X58XXXA Exposure to other specified factors, initial encounter: Secondary | ICD-10-CM | POA: Diagnosis present

## 2016-07-03 DIAGNOSIS — F410 Panic disorder [episodic paroxysmal anxiety] without agoraphobia: Secondary | ICD-10-CM | POA: Diagnosis present

## 2016-07-03 DIAGNOSIS — Z811 Family history of alcohol abuse and dependence: Secondary | ICD-10-CM | POA: Diagnosis not present

## 2016-07-03 DIAGNOSIS — R45851 Suicidal ideations: Secondary | ICD-10-CM | POA: Diagnosis present

## 2016-07-03 DIAGNOSIS — T434X6A Underdosing of butyrophenone and thiothixene neuroleptics, initial encounter: Secondary | ICD-10-CM | POA: Diagnosis present

## 2016-07-03 DIAGNOSIS — Z888 Allergy status to other drugs, medicaments and biological substances status: Secondary | ICD-10-CM

## 2016-07-03 DIAGNOSIS — F17200 Nicotine dependence, unspecified, uncomplicated: Secondary | ICD-10-CM | POA: Diagnosis present

## 2016-07-03 DIAGNOSIS — Z8659 Personal history of other mental and behavioral disorders: Secondary | ICD-10-CM | POA: Diagnosis not present

## 2016-07-03 DIAGNOSIS — F1910 Other psychoactive substance abuse, uncomplicated: Secondary | ICD-10-CM | POA: Diagnosis present

## 2016-07-03 DIAGNOSIS — F1390 Sedative, hypnotic, or anxiolytic use, unspecified, uncomplicated: Secondary | ICD-10-CM | POA: Diagnosis not present

## 2016-07-03 DIAGNOSIS — F1320 Sedative, hypnotic or anxiolytic dependence, uncomplicated: Secondary | ICD-10-CM | POA: Diagnosis present

## 2016-07-03 DIAGNOSIS — F1590 Other stimulant use, unspecified, uncomplicated: Secondary | ICD-10-CM | POA: Diagnosis present

## 2016-07-03 DIAGNOSIS — Z818 Family history of other mental and behavioral disorders: Secondary | ICD-10-CM | POA: Diagnosis not present

## 2016-07-03 DIAGNOSIS — Z79899 Other long term (current) drug therapy: Secondary | ICD-10-CM | POA: Diagnosis not present

## 2016-07-03 DIAGNOSIS — F1490 Cocaine use, unspecified, uncomplicated: Secondary | ICD-10-CM | POA: Diagnosis not present

## 2016-07-03 DIAGNOSIS — F201 Disorganized schizophrenia: Secondary | ICD-10-CM

## 2016-07-03 DIAGNOSIS — N390 Urinary tract infection, site not specified: Secondary | ICD-10-CM | POA: Diagnosis not present

## 2016-07-03 DIAGNOSIS — Z560 Unemployment, unspecified: Secondary | ICD-10-CM

## 2016-07-03 DIAGNOSIS — F1290 Cannabis use, unspecified, uncomplicated: Secondary | ICD-10-CM | POA: Diagnosis not present

## 2016-07-03 DIAGNOSIS — F1210 Cannabis abuse, uncomplicated: Secondary | ICD-10-CM | POA: Diagnosis present

## 2016-07-03 LAB — RAPID URINE DRUG SCREEN, HOSP PERFORMED
Amphetamines: POSITIVE — AB
BARBITURATES: NOT DETECTED
Benzodiazepines: POSITIVE — AB
COCAINE: NOT DETECTED
Opiates: POSITIVE — AB
TETRAHYDROCANNABINOL: POSITIVE — AB

## 2016-07-03 LAB — PREGNANCY, URINE: Preg Test, Ur: NEGATIVE

## 2016-07-03 MED ORDER — CHLORDIAZEPOXIDE HCL 25 MG PO CAPS: 25.0000 mg | ORAL_CAPSULE | Freq: Every day | ORAL | Status: DC

## 2016-07-03 MED ORDER — ALUM & MAG HYDROXIDE-SIMETH 200-200-20 MG/5ML PO SUSP: 30.0000 mL | ORAL | Status: DC | PRN

## 2016-07-03 MED ORDER — NICOTINE 21 MG/24HR TD PT24
21.0000 mg | MEDICATED_PATCH | Freq: Every day | TRANSDERMAL | Status: DC
  Administered 2016-07-04 – 2016-07-05 (×2): 21 mg via TRANSDERMAL
  Filled 2016-07-03 (×7): qty 1

## 2016-07-03 MED ORDER — CEPHALEXIN 500 MG PO CAPS
500.0000 mg | ORAL_CAPSULE | Freq: Four times a day (QID) | ORAL | Status: DC
  Administered 2016-07-03 – 2016-07-07 (×15): 500 mg via ORAL
  Filled 2016-07-03 (×4): qty 1
  Filled 2016-07-03: qty 2
  Filled 2016-07-03: qty 1
  Filled 2016-07-03: qty 10
  Filled 2016-07-03: qty 1
  Filled 2016-07-03: qty 2
  Filled 2016-07-03 (×5): qty 1
  Filled 2016-07-03: qty 2
  Filled 2016-07-03 (×4): qty 1
  Filled 2016-07-03 (×2): qty 10
  Filled 2016-07-03 (×2): qty 1
  Filled 2016-07-03: qty 10
  Filled 2016-07-03 (×2): qty 1

## 2016-07-03 MED ORDER — HALOPERIDOL 5 MG PO TABS
10.0000 mg | ORAL_TABLET | Freq: Every day | ORAL | Status: DC
  Administered 2016-07-03 – 2016-07-06 (×4): 10 mg via ORAL
  Filled 2016-07-03 (×5): qty 2
  Filled 2016-07-03: qty 14
  Filled 2016-07-03: qty 2

## 2016-07-03 MED ORDER — TRAZODONE HCL 50 MG PO TABS
50.0000 mg | ORAL_TABLET | Freq: Every evening | ORAL | Status: DC | PRN
  Administered 2016-07-06: 50 mg via ORAL
  Filled 2016-07-03: qty 7
  Filled 2016-07-03: qty 1

## 2016-07-03 MED ORDER — DOXYCYCLINE HYCLATE 100 MG PO TABS
100.0000 mg | ORAL_TABLET | Freq: Two times a day (BID) | ORAL | Status: DC
  Administered 2016-07-03 – 2016-07-07 (×8): 100 mg via ORAL
  Filled 2016-07-03: qty 1
  Filled 2016-07-03: qty 6
  Filled 2016-07-03 (×9): qty 1
  Filled 2016-07-03: qty 6
  Filled 2016-07-03: qty 1

## 2016-07-03 MED ORDER — HYDROXYZINE HCL 25 MG PO TABS: 25.0000 mg | ORAL_TABLET | Freq: Four times a day (QID) | ORAL | Status: DC | PRN

## 2016-07-03 MED ORDER — CHLORDIAZEPOXIDE HCL 25 MG PO CAPS: 25.0000 mg | ORAL_CAPSULE | ORAL | Status: AC

## 2016-07-03 MED ORDER — CHLORDIAZEPOXIDE HCL 25 MG PO CAPS
25.0000 mg | ORAL_CAPSULE | Freq: Three times a day (TID) | ORAL | Status: AC
  Administered 2016-07-05: 25 mg via ORAL
  Filled 2016-07-03 (×2): qty 1

## 2016-07-03 MED ORDER — LOPERAMIDE HCL 2 MG PO CAPS: 2.0000 mg | ORAL_CAPSULE | ORAL | Status: DC | PRN

## 2016-07-03 MED ORDER — MAGNESIUM HYDROXIDE 400 MG/5ML PO SUSP: 30.0000 mL | Freq: Every day | ORAL | Status: DC | PRN

## 2016-07-03 MED ORDER — VITAMIN B-1 100 MG PO TABS
100.0000 mg | ORAL_TABLET | Freq: Every day | ORAL | Status: DC
  Administered 2016-07-04 – 2016-07-06 (×3): 100 mg via ORAL
  Filled 2016-07-03 (×5): qty 1

## 2016-07-03 MED ORDER — ACETAMINOPHEN 325 MG PO TABS
650.0000 mg | ORAL_TABLET | Freq: Four times a day (QID) | ORAL | Status: DC | PRN
  Administered 2016-07-03 – 2016-07-06 (×5): 650 mg via ORAL
  Filled 2016-07-03 (×5): qty 2

## 2016-07-03 MED ORDER — ONDANSETRON 4 MG PO TBDP: 4.0000 mg | ORAL_TABLET | Freq: Four times a day (QID) | ORAL | Status: DC | PRN

## 2016-07-03 MED ORDER — CHLORDIAZEPOXIDE HCL 25 MG PO CAPS
25.0000 mg | ORAL_CAPSULE | Freq: Four times a day (QID) | ORAL | Status: AC
  Administered 2016-07-03 – 2016-07-04 (×6): 25 mg via ORAL
  Filled 2016-07-03 (×6): qty 1

## 2016-07-03 MED ORDER — ADULT MULTIVITAMIN W/MINERALS CH
1.0000 | ORAL_TABLET | Freq: Every day | ORAL | Status: DC
  Administered 2016-07-03 – 2016-07-06 (×4): 1 via ORAL
  Filled 2016-07-03 (×8): qty 1

## 2016-07-03 MED ORDER — HYDROXYZINE HCL 25 MG PO TABS: 25.0000 mg | ORAL_TABLET | Freq: Four times a day (QID) | ORAL | Status: AC | PRN

## 2016-07-03 MED ORDER — BENZTROPINE MESYLATE 0.5 MG PO TABS
0.5000 mg | ORAL_TABLET | Freq: Two times a day (BID) | ORAL | Status: DC
  Administered 2016-07-03 – 2016-07-04 (×2): 0.5 mg via ORAL
  Filled 2016-07-03 (×7): qty 1

## 2016-07-03 MED ORDER — CHLORDIAZEPOXIDE HCL 25 MG PO CAPS: 25.0000 mg | ORAL_CAPSULE | Freq: Four times a day (QID) | ORAL | Status: AC | PRN

## 2016-07-03 NOTE — Progress Notes (Signed)
Adult Psychoeducational Group Note  Date:  07/03/2016 Time:  9:25 PM  Group Topic/Focus:  Wrap-Up Group:   The focus of this group is to help patients review their daily goal of treatment and discuss progress on daily workbooks.  Participation Level:  Did Not Attend  Participation Quality:  Did not attend  Affect:  Did not attend  Cognitive:  Did not attend  Insight: None  Engagement in Group:  Did not attend  Modes of Intervention:  Did not attend  Additional Comments:  Patient did not attend evening wrap up group.  Leslie FurnaceChristopher  [name] 07/03/2016, 9:25 PM

## 2016-07-03 NOTE — BH Assessment (Signed)
Pt [facility]. Pt filled out and reported chest pain , slurred speech and sweating, rating her pain at 9/10. Approached pt before registration out in lobby due to described above, pt in NAD with normal RR and was able to speak clearly. Adamantly denied any chest pain currently stating it '' happens sometimes '' and states she did not feel she was in any medical acute distress. Informed VS would be obtained after registration and counselor would see. Pt very irritable, but verbalized understanding. TTS to see.
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
## 2016-07-03 NOTE — Tx Team (Signed)
Initial Treatment Plan 07/03/2016 4:40 PM Leslie [name] [id number]    PATIENT STRESSORS: Loss of family members in the last 2 years Substance abuse   PATIENT STRENGTHS: Average or above average intelligence Communication skills General fund of knowledge Supportive family/friends   PATIENT IDENTIFIED PROBLEMS: Depression  Substance Abuse  Risk for suicide  "I want to get back to normal - to be happy."  "I want to not be so angry."             DISCHARGE CRITERIA:  Improved stabilization in mood, thinking, and/or behavior Need for constant or close observation no longer present Reduction of life-threatening or endangering symptoms to within safe limits Verbal commitment to aftercare and medication compliance Withdrawal symptoms are absent or subacute and managed without 24-hour nursing intervention  PRELIMINARY DISCHARGE PLAN: Attend 12-step recovery group Outpatient therapy Return to previous living arrangement  PATIENT/FAMILY INVOLVEMENT: This treatment plan has been presented to and reviewed with the patient, Leslie [name], and/or family member.  The patient and family have been given the opportunity to ask questions and make suggestions.  Lawrence MarseillesFriedman, Marian Eakes, RN 07/03/2016, 4:40 PM
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
## 2016-07-04 ENCOUNTER — Encounter (HOSPITAL_COMMUNITY): Payer: Self-pay | Admitting: Psychiatry

## 2016-07-04 DIAGNOSIS — F312 Bipolar disorder, current episode manic severe with psychotic features: Principal | ICD-10-CM

## 2016-07-04 DIAGNOSIS — F1290 Cannabis use, unspecified, uncomplicated: Secondary | ICD-10-CM

## 2016-07-04 DIAGNOSIS — F1590 Other stimulant use, unspecified, uncomplicated: Secondary | ICD-10-CM

## 2016-07-04 DIAGNOSIS — F17210 Nicotine dependence, cigarettes, uncomplicated: Secondary | ICD-10-CM

## 2016-07-04 DIAGNOSIS — Z811 Family history of alcohol abuse and dependence: Secondary | ICD-10-CM

## 2016-07-04 DIAGNOSIS — S91302A Unspecified open wound, left foot, initial encounter: Secondary | ICD-10-CM | POA: Diagnosis present

## 2016-07-04 DIAGNOSIS — N390 Urinary tract infection, site not specified: Secondary | ICD-10-CM

## 2016-07-04 DIAGNOSIS — F1390 Sedative, hypnotic, or anxiolytic use, unspecified, uncomplicated: Secondary | ICD-10-CM

## 2016-07-04 DIAGNOSIS — Z818 Family history of other mental and behavioral disorders: Secondary | ICD-10-CM

## 2016-07-04 LAB — LITHIUM LEVEL: Lithium Lvl: 0.23 mmol/L — ABNORMAL LOW (ref 0.60–1.20)

## 2016-07-04 MED ORDER — ONDANSETRON 4 MG PO TBDP
4.0000 mg | ORAL_TABLET | Freq: Four times a day (QID) | ORAL | Status: DC | PRN
  Administered 2016-07-04: 4 mg via ORAL
  Filled 2016-07-04: qty 1

## 2016-07-04 MED ORDER — RAMELTEON 8 MG PO TABS
8.0000 mg | ORAL_TABLET | Freq: Every day | ORAL | Status: DC
  Administered 2016-07-04 – 2016-07-06 (×3): 8 mg via ORAL
  Filled 2016-07-04: qty 7
  Filled 2016-07-04 (×5): qty 1

## 2016-07-04 MED ORDER — BENZTROPINE MESYLATE 0.5 MG PO TABS
0.5000 mg | ORAL_TABLET | Freq: Every day | ORAL | Status: DC
  Administered 2016-07-04 – 2016-07-06 (×3): 0.5 mg via ORAL
  Filled 2016-07-04: qty 7
  Filled 2016-07-04 (×4): qty 1

## 2016-07-04 MED ORDER — OLANZAPINE 10 MG IM SOLR: 5.0000 mg | Freq: Three times a day (TID) | INTRAMUSCULAR | Status: DC | PRN

## 2016-07-04 MED ORDER — SILVER SULFADIAZINE 1 % EX CREA
TOPICAL_CREAM | Freq: Every day | CUTANEOUS | Status: DC
  Administered 2016-07-04 – 2016-07-06 (×3): via TOPICAL
  Administered 2016-07-07: 1 via TOPICAL
  Filled 2016-07-04: qty 85

## 2016-07-04 MED ORDER — HYDROXYZINE HCL 25 MG PO TABS: 25.0000 mg | ORAL_TABLET | Freq: Four times a day (QID) | ORAL | Status: DC | PRN

## 2016-07-04 MED ORDER — DICYCLOMINE HCL 20 MG PO TABS: 20.0000 mg | ORAL_TABLET | Freq: Four times a day (QID) | ORAL | Status: DC | PRN

## 2016-07-04 MED ORDER — CLONIDINE HCL 0.1 MG PO TABS
0.1000 mg | ORAL_TABLET | Freq: Four times a day (QID) | ORAL | Status: AC
  Administered 2016-07-04 – 2016-07-05 (×2): 0.1 mg via ORAL
  Filled 2016-07-04 (×7): qty 1

## 2016-07-04 MED ORDER — LOPERAMIDE HCL 2 MG PO CAPS: 2.0000 mg | ORAL_CAPSULE | ORAL | Status: DC | PRN

## 2016-07-04 MED ORDER — LAMOTRIGINE 25 MG PO TABS
25.0000 mg | ORAL_TABLET | Freq: Every day | ORAL | Status: DC
  Administered 2016-07-04 – 2016-07-07 (×4): 25 mg via ORAL
  Filled 2016-07-04: qty 1
  Filled 2016-07-04: qty 7
  Filled 2016-07-04 (×4): qty 1

## 2016-07-04 MED ORDER — CLONIDINE HCL 0.1 MG PO TABS
0.1000 mg | ORAL_TABLET | Freq: Every day | ORAL | Status: DC
  Filled 2016-07-04: qty 1

## 2016-07-04 MED ORDER — NAPROXEN 500 MG PO TABS: 500.0000 mg | ORAL_TABLET | Freq: Two times a day (BID) | ORAL | Status: DC | PRN

## 2016-07-04 MED ORDER — OLANZAPINE 5 MG PO TABS
5.0000 mg | ORAL_TABLET | Freq: Three times a day (TID) | ORAL | Status: DC | PRN
  Administered 2016-07-06: 5 mg via ORAL
  Filled 2016-07-04: qty 2

## 2016-07-04 MED ORDER — CLONIDINE HCL 0.1 MG PO TABS
0.1000 mg | ORAL_TABLET | ORAL | Status: DC
  Filled 2016-07-04 (×4): qty 1

## 2016-07-04 MED ORDER — METHOCARBAMOL 500 MG PO TABS: 500.0000 mg | ORAL_TABLET | Freq: Three times a day (TID) | ORAL | Status: DC | PRN

## 2016-07-04 NOTE — [facility] Group Notes (Signed)
[facility] LCSW Group Therapy  07/04/2016 2:14 PM   Type of Therapy:  Group Therapy  Participation Level:  Active  Participation Quality:  Attentive  Affect:  Appropriate  Cognitive:  Appropriate  Insight:  Improving  Engagement in Therapy:  Engaged  Modes of Intervention:  Clarification, Education, Exploration and Socialization  Summary of Progress/Problems: Today's group focused on relapse prevention.  We defined the term, and then brainstormed on ways to prevent relapse. Came initially.  Was loud, intrusive, unempathetic.  "No one is caring for me.  I want to take a shower and no one will look at my foot."  Left after 10 minutes or so and did not return.  Leslie [name], Leslie [name] 07/04/2016 , 2:14 PM

## 2016-07-04 NOTE — Progress Notes (Signed)
Recreation Therapy Notes  INPATIENT RECREATION THERAPY ASSESSMENT  Patient Details Name: Leslie [name] MRN: [id number] DOB: [birth year] Today's Date: 07/04/2016  Patient Stressors: Family  Pt stated her mother was a stressor. Pt stated she was here for anxiety.  Coping Skills:   Avoidance, Talking, Music  Personal Challenges: Anger, Communication, Concentration, Problem-Solving, Relationships, Self-Esteem/Confidence, Time Management, Trusting Others  Leisure Interests (2+):  Individual - TV, Social - Family  Awareness of Community Resources:  Yes  Community Resources:  Library, [employer], Other (Comment) Building surveyor(Pool)  Current Use: Yes  Patient Strengths:  Nice, I don't yell  Patient Identified Areas of Improvement:  Communication  Current Recreation Participation:  Everyday  Patient Goal for Hospitalization:  "Learn how to not yell, I don't want to be like my mom"  [place]  Current [place] (including self-harm):  No  Current HI:  No  Consent to Intern Participation: N/A   Caroll RancherMarjette Messiah Rovira, LRT/CTRS  Caroll RancherLindsay, Verla Bryngelson A 07/04/2016, 12:32 PM
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
## 2016-07-04 NOTE — Progress Notes (Signed)
Recreation Therapy Notes  Date: 07/04/16 Time: 1000 Location: [address] Dayroom  Group Topic: Coping Skills  Goal Area(s) Addresses:  Patients will be able to identify positive coping skills. Patients will be able to identify benefits of using coping skills post d/c.  Intervention: Mindmap, pencils  Activity: Mindmap.  Patients were given a blank copy of a mindmap.  LRT and patients filled in the first eight boxes together with various situations that require coping skills (bills, loss, anger, medication, family, surgery, relapse and stress.  Patients were to then identify coping skills that can be used to deal with these situations.  LRT would then fill in the coping skills on the board so patients could fill in the blanks on their sheets.  Education: PharmacologistCoping Skills, Building control surveyorDischarge Planning.   Education Outcome: Acknowledges understanding/In group clarification offered/Needs additional education.   Clinical Observations/Feedback  Pt did not attend group.   Caroll RancherMarjette Corisa Montini, LRT/CTRS         Caroll RancherLindsay, Jaylie Neaves A 07/04/2016 12:07 PM

## 2016-07-04 NOTE — Progress Notes (Signed)
D: Pt, isolative and withdrawn to room. Pt at the time of assessment endorsed moderate R. leg pain, depression and anxiety. Pt denied SI, HI or AVH. Pt made minimal interactions; states "I need to rest right now."  A: Medications offered as prescribed.  All patient's questions and concerns addressed. Support, encouragement, and safe environment provided. 15-minute safety checks continue. R: Pt was med compliant. Pt did not attend wrap-up group. Safety checks continue.

## 2016-07-04 NOTE — Progress Notes (Signed)
Patient ID: Gilmore LarocheRenee E Samples, female   DOB: February 11, 1980, 36 y.o.   MRN: [id number]  Pt currently presents with a sad affect and depressed behavior. Pt reports to writer that their goal is to "get some sleep." Pt states "Unless my parents magically show up again tonight at some point." Reports current conflict within the family. Pt reports good sleep with current medication regimen.   Pt provided with medications per providers orders. Pt's labs and vitals were monitored throughout the night. Pt given a 1:1 about emotional and mental status. Pt supported and encouraged to express concerns and questions. Pt educated on medications and assertiveness skills.  Pt's safety ensured with 15 minute and environmental checks. Pt currently denies SI/HI and A/V hallucinations. Pt verbally agrees to seek staff if SI/HI or A/VH occurs and to consult with staff before acting on any harmful thoughts. Will continue POC.

## 2016-07-04 NOTE — Tx Team (Signed)
Interdisciplinary Treatment and Diagnostic Plan Update  07/04/2016 Time of Session: 0930 Leslie [name] MRN: [id number]  Principal Diagnosis: Bipolar disorder, current episode manic severe with psychotic features [facility])  Secondary Diagnoses: Principal Problem:   Bipolar disorder, current episode manic severe with psychotic features [facility]) Active Problems:   Moderate benzodiazepine use disorder (HCC)   Stimulant use disorder (HCC)   Tobacco use disorder, moderate, dependence   Cannabis use disorder, mild, abuse   Open wound of left foot excluding one or more toes   Current Medications:  Current Facility-Administered Medications  Medication Dose Route Frequency Provider Last Rate Last Dose  . acetaminophen (TYLENOL) tablet 650 mg  650 mg Oral Q6H PRN Fransisca Kaufmann A, NP   650 mg at 07/04/16 1134  . alum & mag hydroxide-simeth (MAALOX/MYLANTA) 200-200-20 MG/5ML suspension 30 mL  30 mL Oral Q4H PRN Fransisca Kaufmann A, NP      . benztropine (COGENTIN) tablet 0.5 mg  0.5 mg Oral QHS Eappen, Saramma, MD      . cephALEXin (KEFLEX) capsule 500 mg  500 mg Oral Q6H Fransisca Kaufmann A, NP   500 mg at 07/04/16 1134  . chlordiazePOXIDE (LIBRIUM) capsule 25 mg  25 mg Oral Q6H PRN Fransisca Kaufmann A, NP      . chlordiazePOXIDE (LIBRIUM) capsule 25 mg  25 mg Oral QID Fransisca Kaufmann A, NP   25 mg at 07/04/16 1134   Followed by  . [START ON 07/05/2016] chlordiazePOXIDE (LIBRIUM) capsule 25 mg  25 mg Oral TID Thermon Leyland, NP       Followed by  . [START ON 07/06/2016] chlordiazePOXIDE (LIBRIUM) capsule 25 mg  25 mg Oral BH-qamhs Thermon Leyland, NP       Followed by  . [START ON 07/07/2016] chlordiazePOXIDE (LIBRIUM) capsule 25 mg  25 mg Oral Daily Fransisca Kaufmann A, NP      . cloNIDine (CATAPRES) tablet 0.1 mg  0.1 mg Oral QID Jomarie Longs, MD       Followed by  . [START ON 07/06/2016] cloNIDine (CATAPRES) tablet 0.1 mg  0.1 mg Oral BH-qamhs Jomarie Longs, MD       Followed by  . [START ON 07/08/2016] cloNIDine  (CATAPRES) tablet 0.1 mg  0.1 mg Oral QAC breakfast Eappen, Saramma, MD      . dicyclomine (BENTYL) tablet 20 mg  20 mg Oral Q6H PRN Eappen, Saramma, MD      . doxycycline (VIBRA-TABS) tablet 100 mg  100 mg Oral Q12H Fransisca Kaufmann A, NP   100 mg at 07/04/16 4098  . haloperidol (HALDOL) tablet 10 mg  10 mg Oral QHS Fransisca Kaufmann A, NP   10 mg at 07/03/16 2129  . hydrOXYzine (ATARAX/VISTARIL) tablet 25 mg  25 mg Oral Q6H PRN Thermon Leyland, NP      . lamoTRIgine (LAMICTAL) tablet 25 mg  25 mg Oral Daily Eappen, Saramma, MD      . loperamide (IMODIUM) capsule 2-4 mg  2-4 mg Oral PRN Eappen, Levin Bacon, MD      . magnesium hydroxide (MILK OF MAGNESIA) suspension 30 mL  30 mL Oral Daily PRN Fransisca Kaufmann A, NP      . methocarbamol (ROBAXIN) tablet 500 mg  500 mg Oral Q8H PRN Eappen, Saramma, MD      . multivitamin with minerals tablet 1 tablet  1 tablet Oral Daily Fransisca Kaufmann A, NP   1 tablet at 07/04/16 0826  . naproxen (NAPROSYN) tablet 500 mg  500 mg Oral  BID PRN Jomarie Longs, MD      . nicotine (NICODERM CQ - dosed in mg/24 hours) patch 21 mg  21 mg Transdermal Daily Eappen, Levin Bacon, MD   21 mg at 07/04/16 0827  . OLANZapine (ZYPREXA) tablet 5 mg  5 mg Oral TID PRN Jomarie Longs, MD       Or  . OLANZapine (ZYPREXA) injection 5 mg  5 mg Intramuscular TID PRN Eappen, Levin Bacon, MD      . ondansetron (ZOFRAN-ODT) disintegrating tablet 4 mg  4 mg Oral Q6H PRN Eappen, Saramma, MD      . ramelteon (ROZEREM) tablet 8 mg  8 mg Oral QHS Eappen, Saramma, MD      . silver sulfADIAZINE (SILVADENE) 1 % cream   Topical Daily Adonis Brook, NP      . thiamine (VITAMIN B-1) tablet 100 mg  100 mg Oral Daily Fransisca Kaufmann A, NP   100 mg at 07/04/16 0826  . traZODone (DESYREL) tablet 50 mg  50 mg Oral QHS PRN Thermon Leyland, NP       PTA Medications: Prescriptions Prior to Admission  Medication Sig Dispense Refill Last Dose  . benztropine (COGENTIN) 0.5 MG tablet Take 1 tablet (0.5 mg total) by mouth 2 (two)  times daily in the am and at bedtime.. 60 tablet 0 Past Week at Unknown time  . cephALEXin (KEFLEX) 500 MG capsule Take 1 capsule (500 mg total) by mouth 4 (four) times daily. 28 capsule 0   . doxycycline (VIBRAMYCIN) 100 MG capsule Take 1 capsule (100 mg total) by mouth 2 (two) times daily. One po bid x 7 days 14 capsule 0   . haloperidol (HALDOL) 10 MG tablet Take 1 tablet (10 mg total) by mouth every evening. 30 tablet 0 Past Week at Unknown time  . haloperidol (HALDOL) 10 MG tablet Take 1 tablet (10 mg total) by mouth at bedtime. 30 tablet 0   . haloperidol (HALDOL) 5 MG tablet Take 1 tablet (5 mg total) by mouth daily. (Patient not taking: Reported on 07/01/2016) 30 tablet 0 Not Taking at Unknown time  . levofloxacin (LEVAQUIN) 750 MG tablet Take 1 tablet (750 mg total) by mouth daily. X 7 days 7 tablet 0   . lithium carbonate 300 MG capsule Take 1 capsule (300 mg total) by mouth every morning. 30 capsule 0 Past Week at Unknown time  . lithium carbonate 600 MG capsule Take 1 capsule (600 mg total) by mouth at bedtime. (Patient not taking: Reported on 07/01/2016) 30 capsule 0 Not Taking at Unknown time  . nicotine (NICODERM CQ - DOSED IN MG/24 HOURS) 21 mg/24hr patch Place 1 patch (21 mg total) onto the skin daily. (Patient not taking: Reported on 07/01/2016) 28 patch 0 Not Taking at Unknown time  . ramelteon (ROZEREM) 8 MG tablet Take 1 tablet (8 mg total) by mouth at bedtime. 30 tablet 0 Past Week at Unknown time  . silver sulfADIAZINE (SILVADENE) 1 % cream Apply 1 application topically daily. 50 g 0   . sulfamethoxazole-trimethoprim (BACTRIM DS,SEPTRA DS) 800-160 MG tablet Take 1 tablet by mouth 2 (two) times daily. 20 tablet 0     Patient Stressors: Loss of family members in the last 2 years Substance abuse  Patient Strengths: Average or above average intelligence Communication skills General fund of knowledge Supportive family/friends  Treatment Modalities: Medication Management, Group  therapy, Case management,  1 to 1 session with clinician, Psychoeducation, Recreational therapy.   Physician Treatment Plan for Primary  Diagnosis: Bipolar disorder, current episode manic severe with psychotic features (HCC) Long Term Goal(s): Improvement in symptoms so as ready for discharge Improvement in symptoms so as ready for discharge   Short Term Goals: Ability to verbalize feelings will improve Ability to demonstrate self-control will improve Compliance with prescribed medications will improve Ability to identify triggers associated with substance abuse/mental health issues will improve Ability to verbalize feelings will improve Ability to disclose and discuss suicidal ideas Compliance with prescribed medications will improve Ability to identify triggers associated with substance abuse/mental health issues will improve  Medication Management: Evaluate patient's response, side effects, and tolerance of medication regimen.  Therapeutic Interventions: 1 to 1 sessions, Unit Group sessions and Medication administration.  Evaluation of Outcomes: Progressing  Physician Treatment Plan for Secondary Diagnosis: Principal Problem:   Bipolar disorder, current episode manic severe with psychotic features (HCC) Active Problems:   Moderate benzodiazepine use disorder (HCC)   Stimulant use disorder (HCC)   Tobacco use disorder, moderate, dependence   Cannabis use disorder, mild, abuse   Open wound of left foot excluding one or more toes  Long Term Goal(s): Improvement in symptoms so as ready for discharge Improvement in symptoms so as ready for discharge   Short Term Goals: Ability to verbalize feelings will improve Ability to demonstrate self-control will improve Compliance with prescribed medications will improve Ability to identify triggers associated with substance abuse/mental health issues will improve Ability to verbalize feelings will improve Ability to disclose and discuss  suicidal ideas Compliance with prescribed medications will improve Ability to identify triggers associated with substance abuse/mental health issues will improve     Medication Management: Evaluate patient's response, side effects, and tolerance of medication regimen.  Therapeutic Interventions: 1 to 1 sessions, Unit Group sessions and Medication administration.  Evaluation of Outcomes: Progressing   RN Treatment Plan for Primary Diagnosis: Bipolar disorder, current episode manic severe with psychotic features (HCC) Long Term Goal(s): Knowledge of disease and therapeutic regimen to maintain health will improve  Short Term Goals: Ability to remain free from injury will improve, Ability to verbalize feelings will improve and Ability to disclose and discuss suicidal ideas  Medication Management: RN will administer medications as ordered by provider, will assess and evaluate patient's response and provide education to patient for prescribed medication. RN will report any adverse and/or side effects to prescribing provider.  Therapeutic Interventions: 1 on 1 counseling sessions, Psychoeducation, Medication administration, Evaluate responses to treatment, Monitor vital signs and CBGs as ordered, Perform/monitor CIWA, COWS, AIMS and Fall Risk screenings as ordered, Perform wound care treatments as ordered.  Evaluation of Outcomes: Progressing   Recreational Therapy Treatment Plan for Primary Diagnosis: Bipolar disorder, current episode manic severe with psychotic features (HCC) Long Term Goal(s): Patient will participate in recreation therapy treatment in at least 2 group sessions without prompting from LRT  Short Term Goals: Patient will be able to identify at least 5 coping skills for admitting diagnosis by conclusion of recreation therapy treatment  Treatment Modalities: Group and Pet Therapy  Therapeutic Interventions: Psychoeducation  Evaluation of Outcomes: Progressing   LCSW  Treatment Plan for Primary Diagnosis: Bipolar disorder, current episode manic severe with psychotic features (HCC) Long Term Goal(s): Safe transition to appropriate next level of care at discharge, Engage patient in therapeutic group addressing interpersonal concerns.  Short Term Goals: Engage patient in aftercare planning with referrals and resources, Facilitate patient progression through stages of change regarding substance use diagnoses and concerns and Identify triggers associated with mental health/substance abuse  issues  Therapeutic Interventions: Assess for all discharge needs, 1 to 1 time with Child psychotherapist, Explore available resources and support systems, Assess for adequacy in community support network, Educate family and significant other(s) on suicide prevention, Complete Psychosocial Assessment, Interpersonal group therapy.  Evaluation of Outcomes: Progressing   Progress in Treatment: Attending groups: Yes. Participating in groups: Yes. Taking medication as prescribed: Yes. Toleration medication: Yes. Family/Significant other contact made: No, will contact:  family member if patient consents Patient understands diagnosis: No. Minimal insight; poor historian at this time.  Discussing patient identified problems/goals with staff: No. Medical problems stabilized or resolved: No. Denies suicidal/homicidal ideation: Yes. Issues/concerns per patient self-inventory: No. Other: n/a  New problem(s) identified: Yes, Describe:  poor insight; poor historian  New Short Term/Long Term Goal(s): detox; medication stabilization; development of comprehensive mental wellness/sobriety plan.   Discharge Plan or Barriers: CSW assessing for appropriate referrals. Pt reports that she lives at cousins' home and sleeps on a cot. Sees Dr. Evelene Croon every 6 months for medication management.  Reason for Continuation of Hospitalization: Aggression Anxiety Depression Medication stabilization Withdrawal  symptoms  Estimated Length of Stay: 3-5 days   Attendees: Patient: 07/04/2016 4:29 PM  Physician: Dr. Elna Breslow MD 07/04/2016 4:29 PM  Nursing: Lonia Blood RN 07/04/2016 4:29 PM  RN Care Manager: Onnie Boer CM 07/04/2016 4:29 PM  Social Worker: Trula Slade, LCSW; Daryel Gerald LCSW 07/04/2016 4:29 PM  Recreational Therapist: [name], LRT/CTRS 07/04/2016 4:29 PM  Other: Armandina Stammer NP; Gray Bernhardt NP 07/04/2016 4:29 PM  Other:  07/04/2016 4:29 PM  Other: 07/04/2016 4:29 PM    Scribe for Treatment Team: Ledell Peoples Smart, LCSW 07/04/2016 4:29 PM

## 2016-07-04 NOTE — Progress Notes (Signed)
Adult Psychoeducational Group Note  Date:  07/04/2016 Time:  8:43 PM  Group Topic/Focus:  Wrap-Up Group:   The focus of this group is to help patients review their daily goal of treatment and discuss progress on daily workbooks.  Participation Level:  Did Not Attend  Participation Quality:  Did not attend  Affect:  Did not attend  Cognitive:  Did not attend  Insight: None  Engagement in Group:  Did not attend  Modes of Intervention:  Did not attend  Additional Comments:  Patient did not attend wrap up group tonight.   Cleave Ternes L Bohdi Leeds 07/04/2016, 8:43 PM

## 2016-07-04 NOTE — Progress Notes (Signed)
D: Leslie [name] denied SI, HI, and AVH. She signed a 72-hour request for discharge at approximately 830. "I don't think I really need to be here." She has been eager to change dressing. Urged her to wait for MD to assess site. She has been in-and-out of groups. "Do you think the doctor will change my medications, since I was blacking out?"   A: Meds given as ordered, including PRN Tylenol with results for pain. Q15 safety checks maintained. Support/encouragement offered.  R: Pt remains free from harm and continues with treatment. Will continue to monitor for needs/safety.

## 2016-07-04 NOTE — [facility] Suicide Risk Assessment (Signed)
[facility]   Nursing information obtained from:  Patient, Review of record Demographic factors:  Caucasian, Unemployed Current Mental Status:  Suicidal ideation indicated by patient (denies plan) Loss Factors:  Loss of significant relationship Historical Factors:  Prior suicide attempts, Family history of mental illness or substance abuse Risk Reduction Factors:  Living with another person, especially a relative, Positive social support, Positive therapeutic relationship, Sense of responsibility to family  Total Time spent with patient: 20 minutes Principal Problem: Bipolar disorder, current episode manic severe with psychotic features (HCC) Diagnosis:   Patient Active Problem List   Diagnosis Date Noted  . Open wound of left foot excluding one or more toes [S91.302A] 07/04/2016  . Bipolar disorder without psychotic features (HCC) [F31.9] 07/02/2016  . UTI (urinary tract infection) [N39.0] 07/02/2016  . Polysubstance abuse [F19.10]   . Hyperprolactinemia (HCC) [E22.1] 12/20/2014  . Tobacco use disorder, moderate, dependence [F17.200] 03/19/2014  . Cannabis use disorder, mild, abuse [F12.10]   . Bipolar disorder, current episode manic severe with psychotic features (HCC) [F31.2] 03/13/2014  . 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 Stimulant use disorder (HCC) [F15.90] 03/13/2014   Subjective Data: Please see H&P.   Continued Clinical Symptoms:  Alcohol Use Disorder Identification Test Final Score (AUDIT): 0 The "Alcohol Use Disorders Identification Test", Guidelines for Use in Primary Care, Second Edition.  World Science writerHealth Organization [facility](WHO). Score between 0-7:  no or low risk or alcohol related problems. Score between 8-15:  moderate risk of alcohol related problems. Score between 16-19:  high risk of alcohol related problems. Score 20 or above:  warrants further diagnostic evaluation for alcohol dependence and  treatment.   CLINICAL FACTORS:   Bipolar Disorder: manic Alcohol/Substance Abuse/Dependencies Unstable or Poor Therapeutic Relationship Previous Psychiatric Diagnoses and Treatments   Musculoskeletal: Strength & Muscle Tone: within normal limits Gait & Station: normal Patient leans: N/A  Psychiatric Specialty Exam: Physical Exam  ROS  Blood pressure 116/76, pulse 93, temperature 97.7 F (36.5 C), temperature source Oral, resp. rate 16, height 5\' 4"  (1.626 m), weight 75.3 kg (166 lb), last menstrual period 06/05/2016, SpO2 100 %.Body mass index is 28.49 kg/m.                            Please see H&P.                                COGNITIVE FEATURES THAT CONTRIBUTE TO RISK:  Closed-mindedness, Polarized thinking and Thought constriction (tunnel vision)    SUICIDE RISK:   Moderate:  Frequent suicidal ideation with limited intensity, and duration, some specificity in terms of plans, no associated intent, good self-control, limited dysphoria/symptomatology, some risk factors present, and identifiable protective factors, including available and accessible social support.  PLAN OF CARE: Please see H&P.   I certify that inpatient services furnished can reasonably be expected to improve the patient's condition.   Chanse Kagel, MD 07/04/2016, 2:32 PM
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
## 2016-07-04 NOTE — Consult Note (Addendum)
WOC Nurse wound consult note Reason for Consult: Consult requested for left foot wound. Wound type: Full thickness abrasion to left plantar foot; pt was seen in the ER on 5/26, according to the EMR, and Silvadene was ordered for topical treatment.  Pt was given a blue post-op shoe and crutches and told to avoid weight bearing to the left foot. Measurement: 6X.3X.3cm Wound bed: Beefy red Drainage (amount, consistency, odor) small amt pink drainage, no odor Periwound: Intact skin surrounding Dressing procedure/placement/frequency: Continue present plan of care with Silvadene for topical treatment. Pt states the post-op shoe is not comfortable so she has not been wearing, it is in the room.  She is not allowed to have crutches in this setting, so she has been using a wheelchair to avoid weight bearing to the affected area. ER notes indicate that she should make a follow-up appointment with Dr Lajoyce Cornersuda of the ortho service after discharge. She may shower without a dressing if desired, or if the wound is painful when water hits it, then she can shower with it covered and change the dressing afterwards.  Discussed plan of care with patient and she verbalized understanding. Please re-consult if further assistance is needed.  Thank-you,  Cammie Mcgeeawn Nathian Stencil MSN, RN, CWOCN, [place], CNS [phone]

## 2016-07-05 ENCOUNTER — Other Ambulatory Visit: Payer: Self-pay

## 2016-07-05 NOTE — Progress Notes (Signed)
  DATA ACTION RESPONSE  Objective- Pt. is visible in the dayroom, seen interacting with peers and eating a snack. Presents with a flat/anxious affect and mood. Brightens on approach. No further c/o. No abnormal s/s.  Subjective- Denies having any SI/HI/AVH at this time. Rates pain 5/10; L. Foot. Pt. states " I'm ready to go home; I will be living with my cousin for the time being".     Is cooperative and remain safe on the unit.  1:1 interaction in private to establish rapport. Encouragement, education, & support given from staff.  PRN Tylenol requested and will re-eval accordingly. Dressing on L. Foot intact and dry. To be changed Q day.   Safety maintained with Q 15 checks. Continue with POC.
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
## 2016-07-05 NOTE — Progress Notes (Signed)
Adult Psychoeducational Group Note  Date:  07/05/2016 Time:  9:01 PM  Group Topic/Focus:  Wrap-Up Group:   The focus of this group is to help patients review their daily goal of treatment and discuss progress on daily workbooks.  Participation Level:  Active  Participation Quality:  Appropriate  Affect:  Appropriate  Cognitive:  Alert  Insight: Appropriate  Engagement in Group:  Engaged  Modes of Intervention:  Discussion  Additional Comments:  Patient rated her day a 9-10. Patient's goal for today was to see her daughter.   Arther Heisler L Alexiz Cothran 07/05/2016, 9:01 PM

## 2016-07-05 NOTE — Progress Notes (Signed)
Recreation Therapy Notes  Date: 07/05/16 Time: 1000 Location: 500 Hall Dayroom  Group Topic: Wellness  Goal Area(s) Addresses:  Patient will define components of whole wellness. Patient will verbalize benefit of whole wellness.  Behavioral Response: Engaged  Intervention:  ArchivistChairs, beach ball  Activity:  Keep It ContractorGoing Volleyball.  Patients were seated in a circle.  Patients were to pass the ball back and forth to each other.  LRT would count the number of hits the patients got on the ball.  Patients could bounce the ball off of the floor but the ball could not come to a complete stop.  If the ball stopped the count would start from the beginning.   Education: Wellness, Building control surveyorDischarge Planning.   Education Outcome: Acknowledges education/In group clarification offered/Needs additional education.   Clinical Observations/Feedback: Pt was social, bright and active during group.  Pt gave her best effort during the activity.   Leslie RancherMarjette Dayson Aboud, LRT/CTRS         Leslie RancherLindsay, Emrik Erhard A 07/05/2016 11:58 AM

## 2016-07-05 NOTE — [facility] Suicide Risk Assessment (Signed)
[facility] INPATIENT:  Family/Significant Other Suicide Prevention Education  Suicide Prevention Education:  Education Completed; Fernande BoydenDerrick Miller (pt's boyfriend) [phone] has been identified by the patient as the family member/significant other with whom the patient will be residing, and identified as the person(s) who will aid the patient in the event of a mental health crisis (suicidal ideations/suicide attempt).  With written consent from the patient, the family member/significant other has been provided the following suicide prevention education, prior to the and/or following the discharge of the patient.  The suicide prevention education provided includes the following:  Suicide risk factors  Suicide prevention and interventions  National Suicide Hotline telephone number  [facility] assessment telephone number  [facility] Emergency Assistance 911  [facility] and/or Residential Mobile Crisis Unit telephone number  Request made of family/significant other to:  Remove weapons (e.g., guns, rifles, knives), all items previously/currently identified as safety concern.    Remove drugs/medications (over-the-counter, prescriptions, illicit drugs), all items previously/currently identified as a safety concern.  The family member/significant other verbalizes understanding of the suicide prevention education information provided.  The family member/significant other agrees to remove the items of safety concern listed above.  Shonice Wrisley N Smart LCSW 07/05/2016, 12:42 PM

## 2016-07-05 NOTE — Progress Notes (Signed)
Patient denies SI, HI, and AVH.  Patient has been resting in her room for a great part of the shift. Patient was compliant with medications and dressing changes. Patient stated "I have no withdrawal symptoms."  Assess patient for safety, offer medications as prescribed, engage patient in 1:1 staff talks   Patient able to contract for safety. Continue to monitor as planned.

## 2016-07-05 NOTE — Plan of Care (Signed)
Problem: Safety: Goal: Periods of time without injury will increase Outcome: Progressing Pt. denies SI/HI/AVH at this time, remains a low high risk (hx of falls), Q 15 checks in effect.

## 2016-07-06 DIAGNOSIS — G4700 Insomnia, unspecified: Secondary | ICD-10-CM

## 2016-07-06 DIAGNOSIS — F1490 Cocaine use, unspecified, uncomplicated: Secondary | ICD-10-CM

## 2016-07-06 DIAGNOSIS — F1190 Opioid use, unspecified, uncomplicated: Secondary | ICD-10-CM

## 2016-07-06 LAB — IRON AND TIBC
Iron: 42 ug/dL (ref 28–170)
Saturation Ratios: 11 % (ref 10.4–31.8)
TIBC: 365 ug/dL (ref 250–450)
UIBC: 323 ug/dL

## 2016-07-06 LAB — LIPID PANEL
CHOL/HDL RATIO: 4.5 ratio
CHOLESTEROL: 179 mg/dL (ref 0–200)
HDL: 40 mg/dL — ABNORMAL LOW (ref >40)
LDL Cholesterol: 88 mg/dL (ref 0–99)
TRIGLYCERIDES: 256 mg/dL — AB (ref <150)
VLDL: 51 mg/dL — AB (ref 0–40)

## 2016-07-06 LAB — FERRITIN: Ferritin: 48 ng/mL (ref 11–307)

## 2016-07-06 LAB — TSH: TSH: 3.441 u[IU]/mL (ref 0.350–4.500)

## 2016-07-06 LAB — VITAMIN B12: VITAMIN B 12: 214 pg/mL (ref 180–914)

## 2016-07-06 LAB — FOLATE: Folate: 10.5 ng/mL (ref >5.9)

## 2016-07-06 NOTE — [facility] Group Notes (Signed)
[facility] Group Therapy  07/06/2016 , 11:38 AM    Type of Therapy:  Mental Health [employer] Presentation  Participation Level:  Active  Participation Quality:  Attentive  Affect:  Blunted  Cognitive:  Oriented  Insight:  Limited  Engagement in Therapy:  Engaged  Modes of Intervention:  Discussion, Education and Socialization  Summary of Progress/Problems:  Onalee HuaDavid from [employer] came to present his recovery story and play the guitar.  Invited.  Chose to not attend.  "Dr Elna BreslowEappen told me I could go today if I took my meds and went to all my groups.  I promised my daughter I would be home today."  Agitated, angry, tearful.  [name], Siriyah Ambrosius B 07/06/2016 , 11:38 AM

## 2016-07-06 NOTE — Progress Notes (Signed)
Pt attended karaoke group this evening.  
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
## 2016-07-06 NOTE — Plan of Care (Signed)
Problem: Activity: Goal: Interest or engagement in activities will improve Outcome: Progressing Pt. Participated in karaoke this evening provided with encouragement.

## 2016-07-06 NOTE — Progress Notes (Signed)
  DATA ACTION RESPONSE  Objective- Pt. is visible in the dayroom, seen interacting with peers and watching TV. Presents with a flat/anxious affect and mood. Brightens on approach. No further c/o. No abnormal s/s.  Subjective- Denies having any SI/HI/AVH at this time. Denies Withdrawal s/s.Rates pain 7/10; L. Foot. Pt. had question regarding 72 hr sign request for d/c signed on 5/29 @ 0830. Wanted to be d/c soon. Is cooperative and remain safe on the unit.  1:1 interaction in private to establish rapport. Encouragement, education, & support given from staff.  PRN Tylenol and Trazodone requested and will re-eval accordingly. Dressing on L. Foot intact and dry. To be changed Q day.   Safety maintained with Q 15 checks. Continue with POC.

## 2016-07-06 NOTE — Progress Notes (Signed)
Patient denies SI, HI, and AVH.  Patient has been resting in her room for a great part of the shift. Patient was compliant with medications and dressing changes.  Patient became agitated when she found out that she was not going to discharge. Patient stated "I have no withdrawal symptoms."  Assess patient for safety, offer medications as prescribed, engage patient in 1:1 staff talks   Patient able to contract for safety. Continue to monitor as planned.
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
## 2016-07-06 NOTE — Tx Team (Signed)
Interdisciplinary Treatment and Diagnostic Plan Update  07/06/2016 Time of Session: 0930 Gilmore LarocheRenee E Linville MRN: [id number]  Principal Diagnosis: Bipolar disorder, current episode manic severe with psychotic features [facility](HCC)  Secondary Diagnoses: Principal Problem:   Bipolar disorder, current episode manic severe with psychotic features [facility](HCC) Active Problems:   Moderate benzodiazepine use disorder (HCC)   Stimulant use disorder (HCC)   Tobacco use disorder, moderate, dependence   Cannabis use disorder, mild, abuse   Open wound of left foot excluding one or more toes   Current Medications:  Current Facility-Administered Medications  Medication Dose Route Frequency Provider Last Rate Last Dose  . acetaminophen (TYLENOL) tablet 650 mg  650 mg Oral Q6H PRN Thermon Leylandavis, Laura A, NP   650 mg at 07/05/16 2146  . alum & mag hydroxide-simeth (MAALOX/MYLANTA) 200-200-20 MG/5ML suspension 30 mL  30 mL Oral Q4H PRN Fransisca Kaufmannavis, Laura A, NP      . benztropine (COGENTIN) tablet 0.5 mg  0.5 mg Oral QHS Eappen, Levin BaconSaramma, MD   0.5 mg at 07/05/16 2144  . cephALEXin (KEFLEX) capsule 500 mg  500 mg Oral Q6H Fransisca Kaufmannavis, Laura A, NP   500 mg at 07/06/16 [id number]  . chlordiazePOXIDE (LIBRIUM) capsule 25 mg  25 mg Oral Q6H PRN Fransisca Kaufmannavis, Laura A, NP      . chlordiazePOXIDE (LIBRIUM) capsule 25 mg  25 mg Oral BH-qamhs Thermon Leylandavis, Laura A, NP       Followed by  . [START ON 07/07/2016] chlordiazePOXIDE (LIBRIUM) capsule 25 mg  25 mg Oral Daily Fransisca Kaufmannavis, Laura A, NP      . cloNIDine (CATAPRES) tablet 0.1 mg  0.1 mg Oral BH-qamhs Jomarie LongsEappen, Saramma, MD       Followed by  . [START ON 07/08/2016] cloNIDine (CATAPRES) tablet 0.1 mg  0.1 mg Oral QAC breakfast Eappen, Saramma, MD      . dicyclomine (BENTYL) tablet 20 mg  20 mg Oral Q6H PRN Eappen, Saramma, MD      . doxycycline (VIBRA-TABS) tablet 100 mg  100 mg Oral Q12H Fransisca Kaufmannavis, Laura A, NP   100 mg at 07/06/16 0815  . haloperidol (HALDOL) tablet 10 mg  10 mg Oral QHS Fransisca Kaufmannavis, Laura A, NP   10 mg at 07/05/16 2143   . hydrOXYzine (ATARAX/VISTARIL) tablet 25 mg  25 mg Oral Q6H PRN Fransisca Kaufmannavis, Laura A, NP      . lamoTRIgine (LAMICTAL) tablet 25 mg  25 mg Oral Daily Jomarie LongsEappen, Saramma, MD   25 mg at 07/06/16 0816  . loperamide (IMODIUM) capsule 2-4 mg  2-4 mg Oral PRN Eappen, Levin BaconSaramma, MD      . magnesium hydroxide (MILK OF MAGNESIA) suspension 30 mL  30 mL Oral Daily PRN Fransisca Kaufmannavis, Laura A, NP      . methocarbamol (ROBAXIN) tablet 500 mg  500 mg Oral Q8H PRN Jomarie LongsEappen, Saramma, MD      . multivitamin with minerals tablet 1 tablet  1 tablet Oral Daily Fransisca Kaufmannavis, Laura A, NP   1 tablet at 07/05/16 1216  . naproxen (NAPROSYN) tablet 500 mg  500 mg Oral BID PRN Jomarie LongsEappen, Saramma, MD      . nicotine (NICODERM CQ - dosed in mg/24 hours) patch 21 mg  21 mg Transdermal Daily Jomarie LongsEappen, Saramma, MD   21 mg at 07/05/16 0803  . OLANZapine (ZYPREXA) tablet 5 mg  5 mg Oral TID PRN Jomarie LongsEappen, Saramma, MD       Or  . OLANZapine (ZYPREXA) injection 5 mg  5 mg Intramuscular TID PRN Jomarie LongsEappen, Saramma, MD      .  ondansetron (ZOFRAN-ODT) disintegrating tablet 4 mg  4 mg Oral Q6H PRN Jomarie Longs, MD   4 mg at 07/04/16 2023  . ramelteon (ROZEREM) tablet 8 mg  8 mg Oral QHS Jomarie Longs, MD   8 mg at 07/05/16 2144  . silver sulfADIAZINE (SILVADENE) 1 % cream   Topical Daily Eappen, Saramma, MD      . thiamine (VITAMIN B-1) tablet 100 mg  100 mg Oral Daily Fransisca Kaufmann A, NP   100 mg at 07/06/16 0815  . traZODone (DESYREL) tablet 50 mg  50 mg Oral QHS PRN Thermon Leyland, NP       PTA Medications: Prescriptions Prior to Admission  Medication Sig Dispense Refill Last Dose  . benztropine (COGENTIN) 0.5 MG tablet Take 1 tablet (0.5 mg total) by mouth 2 (two) times daily in the am and at bedtime.. 60 tablet 0 Past Week at Unknown time  . cephALEXin (KEFLEX) 500 MG capsule Take 1 capsule (500 mg total) by mouth 4 (four) times daily. 28 capsule 0   . doxycycline (VIBRAMYCIN) 100 MG capsule Take 1 capsule (100 mg total) by mouth 2 (two) times daily. One po bid  x 7 days 14 capsule 0   . haloperidol (HALDOL) 10 MG tablet Take 1 tablet (10 mg total) by mouth every evening. 30 tablet 0 Past Week at Unknown time  . haloperidol (HALDOL) 10 MG tablet Take 1 tablet (10 mg total) by mouth at bedtime. 30 tablet 0   . haloperidol (HALDOL) 5 MG tablet Take 1 tablet (5 mg total) by mouth daily. (Patient not taking: Reported on 07/01/2016) 30 tablet 0 Not Taking at Unknown time  . levofloxacin (LEVAQUIN) 750 MG tablet Take 1 tablet (750 mg total) by mouth daily. X 7 days 7 tablet 0   . lithium carbonate 300 MG capsule Take 1 capsule (300 mg total) by mouth every morning. 30 capsule 0 Past Week at Unknown time  . lithium carbonate 600 MG capsule Take 1 capsule (600 mg total) by mouth at bedtime. (Patient not taking: Reported on 07/01/2016) 30 capsule 0 Not Taking at Unknown time  . nicotine (NICODERM CQ - DOSED IN MG/24 HOURS) 21 mg/24hr patch Place 1 patch (21 mg total) onto the skin daily. (Patient not taking: Reported on 07/01/2016) 28 patch 0 Not Taking at Unknown time  . ramelteon (ROZEREM) 8 MG tablet Take 1 tablet (8 mg total) by mouth at bedtime. 30 tablet 0 Past Week at Unknown time  . silver sulfADIAZINE (SILVADENE) 1 % cream Apply 1 application topically daily. 50 g 0   . sulfamethoxazole-trimethoprim (BACTRIM DS,SEPTRA DS) 800-160 MG tablet Take 1 tablet by mouth 2 (two) times daily. 20 tablet 0     Patient Stressors: Loss of family members in the last 2 years Substance abuse  Patient Strengths: Average or above average intelligence Communication skills General fund of knowledge Supportive family/friends  Treatment Modalities: Medication Management, Group therapy, Case management,  1 to 1 session with clinician, Psychoeducation, Recreational therapy.   Physician Treatment Plan for Primary Diagnosis: Bipolar disorder, current episode manic severe with psychotic features (HCC) Long Term Goal(s): Improvement in symptoms so as ready for  discharge Improvement in symptoms so as ready for discharge   Short Term Goals: Ability to verbalize feelings will improve Ability to demonstrate self-control will improve Compliance with prescribed medications will improve Ability to identify triggers associated with substance abuse/mental health issues will improve Ability to verbalize feelings will improve Ability to disclose and discuss  suicidal ideas Compliance with prescribed medications will improve Ability to identify triggers associated with substance abuse/mental health issues will improve  Medication Management: Evaluate patient's response, side effects, and tolerance of medication regimen.  Therapeutic Interventions: 1 to 1 sessions, Unit Group sessions and Medication administration.  Evaluation of Outcomes: Progressing  Physician Treatment Plan for Secondary Diagnosis: Principal Problem:   Bipolar disorder, current episode manic severe with psychotic features (HCC) Active Problems:   Moderate benzodiazepine use disorder (HCC)   Stimulant use disorder (HCC)   Tobacco use disorder, moderate, dependence   Cannabis use disorder, mild, abuse   Open wound of left foot excluding one or more toes  Long Term Goal(s): Improvement in symptoms so as ready for discharge Improvement in symptoms so as ready for discharge   Short Term Goals: Ability to verbalize feelings will improve Ability to demonstrate self-control will improve Compliance with prescribed medications will improve Ability to identify triggers associated with substance abuse/mental health issues will improve Ability to verbalize feelings will improve Ability to disclose and discuss suicidal ideas Compliance with prescribed medications will improve Ability to identify triggers associated with substance abuse/mental health issues will improve     Medication Management: Evaluate patient's response, side effects, and tolerance of medication regimen.  Therapeutic  Interventions: 1 to 1 sessions, Unit Group sessions and Medication administration.  Evaluation of Outcomes: Progressing  5/31: Treatment Plan Summary: Patient with mood lability, polysubstance abuse , noncompliant with medications. Continue Haldol 10 mg po qhs for bipolar do. Continue Cogentin 0.5 mg po qhs for EPS. Continue Lamictal 25 mg po daily for mood sx. Continue Rozerem 8 mg po qhs for insomnia. Continue Keflex 500 mg PO q6h for wound on her foot.  CIWA/librium protocol for BZD abuse - reviewed [place] controlled substance database - she is only prescribed adderall. Continue COWS/Clonidine protocol for opioid abuse/withdrawal.     RN Treatment Plan for Primary Diagnosis: Bipolar disorder, current episode manic severe with psychotic features (HCC) Long Term Goal(s): Knowledge of disease and therapeutic regimen to maintain health will improve  Short Term Goals: Ability to remain free from injury will improve, Ability to verbalize feelings will improve and Ability to disclose and discuss suicidal ideas  Medication Management: RN will administer medications as ordered by provider, will assess and evaluate patient's response and provide education to patient for prescribed medication. RN will report any adverse and/or side effects to prescribing provider.  Therapeutic Interventions: 1 on 1 counseling sessions, Psychoeducation, Medication administration, Evaluate responses to treatment, Monitor vital signs and CBGs as ordered, Perform/monitor CIWA, COWS, AIMS and Fall Risk screenings as ordered, Perform wound care treatments as ordered.  Evaluation of Outcomes: Progressing   Recreational Therapy Treatment Plan for Primary Diagnosis: Bipolar disorder, current episode manic severe with psychotic features (HCC) Long Term Goal(s): Patient will participate in recreation therapy treatment in at least 2 group sessions without prompting from LRT  Short Term Goals: Patient will be able to  identify at least 5 coping skills for admitting diagnosis by conclusion of recreation therapy treatment  Treatment Modalities: Group and Pet Therapy  Therapeutic Interventions: Psychoeducation  Evaluation of Outcomes: Progressing   LCSW Treatment Plan for Primary Diagnosis: Bipolar disorder, current episode manic severe with psychotic features (HCC) Long Term Goal(s): Safe transition to appropriate next level of care at discharge, Engage patient in therapeutic group addressing interpersonal concerns.  Short Term Goals: Engage patient in aftercare planning with referrals and resources, Facilitate patient progression through stages of change regarding substance use diagnoses  and concerns and Identify triggers associated with mental health/substance abuse issues  Therapeutic Interventions: Assess for all discharge needs, 1 to 1 time with Social worker, Explore available resources and support systems, Assess for adequacy in community support network, Educate family and significant other(s) on suicide prevention, Complete Psychosocial Assessment, Interpersonal group therapy.  Evaluation of Outcomes: Progressing   Progress in Treatment: Attending groups: Yes. Participating in groups: Yes. Taking medication as prescribed: Yes. Toleration medication: Yes. Family/Significant other contact made: No, will contact:  family member if patient consents Patient understands diagnosis: No. Minimal insight; poor historian at this time.  Discussing patient identified problems/goals with staff: No. Medical problems stabilized or resolved: No. Denies suicidal/homicidal ideation: Yes. Issues/concerns per patient self-inventory: No. Other: n/a  New problem(s) identified: Yes, Describe:  poor insight; poor historian  New Short Term/Long Term Goal(s): detox; medication stabilization; development of comprehensive mental wellness/sobriety plan.   Discharge Plan or Barriers: CSW assessing for appropriate  referrals. Pt reports that she lives at cousins' home and sleeps on a cot. Sees Dr. Evelene Croon every 6 months for medication management.  Reason for Continuation of Hospitalization:  Mania Medication Stabilization Mood Instability  Estimated Length of Stay: 3-5 days   Attendees: Patient: 07/06/2016 11:38 AM  Physician: Dr. Elna Breslow MD 07/06/2016 11:38 AM  Nursing: Lonia Blood RN 07/06/2016 11:38 AM  RN Care Manager: Onnie Boer CM 07/06/2016 11:38 AM  Social Worker:  Daryel Gerald LCSW 07/06/2016 11:38 AM  Recreational Therapist: [name], LRT/CTRS 07/06/2016 11:38 AM  Other: Armandina Stammer NP; May Augustin NP 07/06/2016 11:38 AM  Other:  07/06/2016 11:38 AM  Other: 07/06/2016 11:38 AM    Scribe for Treatment Team: Ida Rogue, LCSW 07/06/2016 11:38 AM

## 2016-07-07 DIAGNOSIS — F1910 Other psychoactive substance abuse, uncomplicated: Secondary | ICD-10-CM

## 2016-07-07 LAB — HEMOGLOBIN A1C
Hgb A1c MFr Bld: 5.4 % (ref 4.8–5.6)
Mean Plasma Glucose: 108 mg/dL

## 2016-07-07 LAB — PROLACTIN: Prolactin: 64.2 ng/mL — ABNORMAL HIGH (ref 4.8–23.3)

## 2016-07-07 MED ORDER — CEPHALEXIN 500 MG PO CAPS: 500.0000 mg | ORAL_CAPSULE | Freq: Four times a day (QID) | ORAL | 0 refills | Status: DC

## 2016-07-07 MED ORDER — NICOTINE 21 MG/24HR TD PT24: 21.0000 mg | MEDICATED_PATCH | Freq: Every day | TRANSDERMAL | 0 refills | Status: DC

## 2016-07-07 MED ORDER — RAMELTEON 8 MG PO TABS: 8.0000 mg | ORAL_TABLET | Freq: Every day | ORAL | 0 refills | Status: DC

## 2016-07-07 MED ORDER — LAMOTRIGINE 25 MG PO TABS: 25.0000 mg | ORAL_TABLET | Freq: Every day | ORAL | 0 refills | Status: DC

## 2016-07-07 MED ORDER — DOXYCYCLINE HYCLATE 100 MG PO TABS: 100.0000 mg | ORAL_TABLET | Freq: Two times a day (BID) | ORAL | 0 refills | Status: DC

## 2016-07-07 MED ORDER — HALOPERIDOL 10 MG PO TABS: 10.0000 mg | ORAL_TABLET | Freq: Every day | ORAL | 0 refills | Status: DC

## 2016-07-07 MED ORDER — TRAZODONE HCL 50 MG PO TABS: 50.0000 mg | ORAL_TABLET | Freq: Every evening | ORAL | 0 refills | Status: DC | PRN

## 2016-07-07 MED ORDER — BENZTROPINE MESYLATE 0.5 MG PO TABS: 0.5000 mg | ORAL_TABLET | Freq: Every day | ORAL | 0 refills | Status: DC
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
## 2016-07-07 NOTE — Progress Notes (Signed)
  [facility] :  Will you be returning to the same living situation after discharge:  Yes,  home At discharge, do you have transportation home?: Yes,  friend Do you have the ability to pay for your medications: Yes,  family helps  Release of information consent forms completed and in the chart;  Patient's signature needed at discharge.  Patient to Follow up at: Follow-up Information    Milagros EvenerKaur, Rupinder, MD Follow up on 07/14/2016.   Specialty:  Psychiatry Why:  Hospital follow-up/medication management with Dr. Evelene CroonKaur on this date at 4:30PM. Thank you.  Contact information: [address] [address] [place] [address] [phone]           Next level of care provider [facility]:no  Safety Planning and Suicide Prevention discussed: Yes,  yes  Have you used any form of tobacco in the last 30 days? (Cigarettes, Smokeless Tobacco, Cigars, and/or Pipes): Yes  Has patient been referred to the Quitline?: Patient refused referral  Patient has been referred for addiction treatment: Pt. refused referral  Leslie [name] 07/07/2016, 9:48 AM

## 2016-07-07 NOTE — Progress Notes (Signed)
Recreation Therapy Notes  Date: 07/07/16 Time: 1000 Location: 500 [address] Dayroom  Group Topic: Communication, Team Building, Problem Solving  Goal Area(s) Addresses:  Patient will effectively work with peer towards shared goal.  Patient will identify skills used to make activity successful.  Patient will identify how skills used during activity can be used to reach post d/c goals.   Behavioral Response: Engaged  Intervention: STEM Activity  Activity: Stage managerLanding Pad. In teams patients were given 12 plastic drinking straws and [name] length of masking tape. Using the materials provided patients were asked to build [name] landing pad to catch [name] golf ball dropped from approximately 6 feet in the air.   Education: Pharmacist, communityocial Skills, Discharge Planning   Education Outcome: Acknowledges education/In group clarification offered/Needs additional education.   Clinical Observations/Feedback: Pt was bright and focused on the activity.  Pt expressed they had to use their brain and mechanical skills to complete the activity.  Pt stated the activity was hard because "it was hard to make circles with just straw and tape".  Pt stated these skills could help improve the relationship with her mother and just make it better.   Leslie [name], Leslie [name]        Leslie [name], Leslie [name] 07/07/2016 11:45 AM

## 2016-07-07 NOTE — Progress Notes (Signed)
Discharge Note : Discharge instructions/medications/follow up appointments discussed with pt. Prescriptions given, samples given, and patients belongings returned to pt.   Pt verbalizes understanding. Dressing changed on left foot small drainage note. Pt given supplies to take home. Transported via mother, cruthes also returned to pt.  Pt denies SI/HI/AVH.

## 2016-07-07 NOTE — Tx Team (Signed)
Interdisciplinary Treatment and Diagnostic Plan Update  07/07/2016 Time of Session: 0930 Leslie [name] MRN: [id number]  Principal Diagnosis: Bipolar disorder, current episode manic severe with psychotic features [facility](HCC)  Secondary Diagnoses: Principal Problem:   Bipolar disorder, current episode manic severe with psychotic features [facility](HCC) Active Problems:   Moderate benzodiazepine use disorder (HCC)   Stimulant use disorder (HCC)   Tobacco use disorder, moderate, dependence   Cannabis use disorder, mild, abuse   Open wound of left foot excluding one or more toes   Current Medications:  Current Facility-Administered Medications  Medication Dose Route Frequency Provider Last Rate Last Dose  . acetaminophen (TYLENOL) tablet 650 mg  650 mg Oral Q6H PRN Thermon Leylandavis, Laura A, NP   650 mg at 07/06/16 2149  . alum & mag hydroxide-simeth (MAALOX/MYLANTA) 200-200-20 MG/5ML suspension 30 mL  30 mL Oral Q4H PRN Fransisca Kaufmannavis, Laura A, NP      . benztropine (COGENTIN) tablet 0.5 mg  0.5 mg Oral QHS Eappen, Levin BaconSaramma, MD   0.5 mg at 07/06/16 2149  . cephALEXin (KEFLEX) capsule 500 mg  500 mg Oral Q6H Fransisca Kaufmannavis, Laura A, NP   500 mg at 07/07/16 [id number]  . chlordiazePOXIDE (LIBRIUM) capsule 25 mg  25 mg Oral Daily Fransisca Kaufmannavis, Laura A, NP      . cloNIDine (CATAPRES) tablet 0.1 mg  0.1 mg Oral Deidre AlaBH-qamhs Eappen, Saramma, MD       Followed by  . [START ON 07/08/2016] cloNIDine (CATAPRES) tablet 0.1 mg  0.1 mg Oral QAC breakfast Eappen, Saramma, MD      . dicyclomine (BENTYL) tablet 20 mg  20 mg Oral Q6H PRN Eappen, Saramma, MD      . doxycycline (VIBRA-TABS) tablet 100 mg  100 mg Oral Q12H Fransisca Kaufmannavis, Laura A, NP   100 mg at 07/07/16 0847  . haloperidol (HALDOL) tablet 10 mg  10 mg Oral QHS Fransisca Kaufmannavis, Laura A, NP   10 mg at 07/06/16 2149  . lamoTRIgine (LAMICTAL) tablet 25 mg  25 mg Oral Daily Jomarie LongsEappen, Saramma, MD   25 mg at 07/07/16 0847  . loperamide (IMODIUM) capsule 2-4 mg  2-4 mg Oral PRN Eappen, Levin BaconSaramma, MD      . magnesium hydroxide (MILK  OF MAGNESIA) suspension 30 mL  30 mL Oral Daily PRN Fransisca Kaufmannavis, Laura A, NP      . methocarbamol (ROBAXIN) tablet 500 mg  500 mg Oral Q8H PRN Jomarie LongsEappen, Saramma, MD      . multivitamin with minerals tablet 1 tablet  1 tablet Oral Daily Fransisca Kaufmannavis, Laura A, NP   1 tablet at 07/06/16 1205  . naproxen (NAPROSYN) tablet 500 mg  500 mg Oral BID PRN Jomarie LongsEappen, Saramma, MD      . nicotine (NICODERM CQ - dosed in mg/24 hours) patch 21 mg  21 mg Transdermal Daily Jomarie LongsEappen, Saramma, MD   21 mg at 07/05/16 0803  . OLANZapine (ZYPREXA) tablet 5 mg  5 mg Oral TID PRN Jomarie LongsEappen, Saramma, MD   5 mg at 07/06/16 1204   Or  . OLANZapine (ZYPREXA) injection 5 mg  5 mg Intramuscular TID PRN Eappen, Levin BaconSaramma, MD      . ondansetron (ZOFRAN-ODT) disintegrating tablet 4 mg  4 mg Oral Q6H PRN Jomarie LongsEappen, Saramma, MD   4 mg at 07/04/16 2023  . ramelteon (ROZEREM) tablet 8 mg  8 mg Oral QHS Jomarie LongsEappen, Saramma, MD   8 mg at 07/06/16 2149  . silver sulfADIAZINE (SILVADENE) 1 % cream   Topical Daily Jomarie LongsEappen, Saramma, MD  1 application at 07/07/16 0846  . thiamine (VITAMIN B-1) tablet 100 mg  100 mg Oral Daily Fransisca Kaufmann A, NP   100 mg at 07/06/16 0815  . traZODone (DESYREL) tablet 50 mg  50 mg Oral QHS PRN Thermon Leyland, NP   50 mg at 07/06/16 2149   PTA Medications: Prescriptions Prior to Admission  Medication Sig Dispense Refill Last Dose  . benztropine (COGENTIN) 0.5 MG tablet Take 1 tablet (0.5 mg total) by mouth 2 (two) times daily in the am and at bedtime.. 60 tablet 0 Past Week at Unknown time  . cephALEXin (KEFLEX) 500 MG capsule Take 1 capsule (500 mg total) by mouth 4 (four) times daily. 28 capsule 0   . doxycycline (VIBRAMYCIN) 100 MG capsule Take 1 capsule (100 mg total) by mouth 2 (two) times daily. One po bid x 7 days 14 capsule 0   . haloperidol (HALDOL) 10 MG tablet Take 1 tablet (10 mg total) by mouth every evening. 30 tablet 0 Past Week at Unknown time  . haloperidol (HALDOL) 10 MG tablet Take 1 tablet (10 mg total) by mouth at  bedtime. 30 tablet 0   . haloperidol (HALDOL) 5 MG tablet Take 1 tablet (5 mg total) by mouth daily. (Patient not taking: Reported on 07/01/2016) 30 tablet 0 Not Taking at Unknown time  . levofloxacin (LEVAQUIN) 750 MG tablet Take 1 tablet (750 mg total) by mouth daily. X 7 days 7 tablet 0   . lithium carbonate 300 MG capsule Take 1 capsule (300 mg total) by mouth every morning. 30 capsule 0 Past Week at Unknown time  . lithium carbonate 600 MG capsule Take 1 capsule (600 mg total) by mouth at bedtime. (Patient not taking: Reported on 07/01/2016) 30 capsule 0 Not Taking at Unknown time  . nicotine (NICODERM CQ - DOSED IN MG/24 HOURS) 21 mg/24hr patch Place 1 patch (21 mg total) onto the skin daily. (Patient not taking: Reported on 07/01/2016) 28 patch 0 Not Taking at Unknown time  . ramelteon (ROZEREM) 8 MG tablet Take 1 tablet (8 mg total) by mouth at bedtime. 30 tablet 0 Past Week at Unknown time  . silver sulfADIAZINE (SILVADENE) 1 % cream Apply 1 application topically daily. 50 g 0   . sulfamethoxazole-trimethoprim (BACTRIM DS,SEPTRA DS) 800-160 MG tablet Take 1 tablet by mouth 2 (two) times daily. 20 tablet 0     Patient Stressors: Loss of family members in the last 2 years Substance abuse  Patient Strengths: Average or above average intelligence Communication skills General fund of knowledge Supportive family/friends  Treatment Modalities: Medication Management, Group therapy, Case management,  1 to 1 session with clinician, Psychoeducation, Recreational therapy.   Physician Treatment Plan for Primary Diagnosis: Bipolar disorder, current episode manic severe with psychotic features (HCC) Long Term Goal(s): Improvement in symptoms so as ready for discharge Improvement in symptoms so as ready for discharge   Short Term Goals: Ability to verbalize feelings will improve Ability to demonstrate self-control will improve Compliance with prescribed medications will improve Ability to  identify triggers associated with substance abuse/mental health issues will improve Ability to verbalize feelings will improve Ability to disclose and discuss suicidal ideas Compliance with prescribed medications will improve Ability to identify triggers associated with substance abuse/mental health issues will improve  Medication Management: Evaluate patient's response, side effects, and tolerance of medication regimen.  Therapeutic Interventions: 1 to 1 sessions, Unit Group sessions and Medication administration.  Evaluation of Outcomes: Adequate for Discharge  Physician  Treatment Plan for Secondary Diagnosis: Principal Problem:   Bipolar disorder, current episode manic severe with psychotic features (HCC) Active Problems:   Moderate benzodiazepine use disorder (HCC)   Stimulant use disorder (HCC)   Tobacco use disorder, moderate, dependence   Cannabis use disorder, mild, abuse   Open wound of left foot excluding one or more toes  Long Term Goal(s): Improvement in symptoms so as ready for discharge Improvement in symptoms so as ready for discharge   Short Term Goals: Ability to verbalize feelings will improve Ability to demonstrate self-control will improve Compliance with prescribed medications will improve Ability to identify triggers associated with substance abuse/mental health issues will improve Ability to verbalize feelings will improve Ability to disclose and discuss suicidal ideas Compliance with prescribed medications will improve Ability to identify triggers associated with substance abuse/mental health issues will improve     Medication Management: Evaluate patient's response, side effects, and tolerance of medication regimen.  Therapeutic Interventions: 1 to 1 sessions, Unit Group sessions and Medication administration.  Evaluation of Outcomes: Adequate for Discharge  5/31: Treatment Plan Summary: Patient with mood lability, polysubstance abuse , noncompliant  with medications. Continue Haldol 10 mg po qhs for bipolar do. Continue Cogentin 0.5 mg po qhs for EPS. Continue Lamictal 25 mg po daily for mood sx. Continue Rozerem 8 mg po qhs for insomnia. Continue Keflex 500 mg PO q6h for wound on her foot.  CIWA/librium protocol for BZD abuse - reviewed [place] controlled substance database - she is only prescribed adderall. Continue COWS/Clonidine protocol for opioid abuse/withdrawal.     RN Treatment Plan for Primary Diagnosis: Bipolar disorder, current episode manic severe with psychotic features (HCC) Long Term Goal(s): Knowledge of disease and therapeutic regimen to maintain health will improve  Short Term Goals: Ability to remain free from injury will improve, Ability to verbalize feelings will improve and Ability to disclose and discuss suicidal ideas  Medication Management: RN will administer medications as ordered by provider, will assess and evaluate patient's response and provide education to patient for prescribed medication. RN will report any adverse and/or side effects to prescribing provider.  Therapeutic Interventions: 1 on 1 counseling sessions, Psychoeducation, Medication administration, Evaluate responses to treatment, Monitor vital signs and CBGs as ordered, Perform/monitor CIWA, COWS, AIMS and Fall Risk screenings as ordered, Perform wound care treatments as ordered.  Evaluation of Outcomes: Adequate for Discharge   Recreational Therapy Treatment Plan for Primary Diagnosis: Bipolar disorder, current episode manic severe with psychotic features (HCC) Long Term Goal(s): Patient will participate in recreation therapy treatment in at least 2 group sessions without prompting from LRT  Short Term Goals: Patient will be able to identify at least 5 coping skills for admitting diagnosis by conclusion of recreation therapy treatment  Treatment Modalities: Group and Pet Therapy  Therapeutic Interventions: Psychoeducation  Evaluation of  Outcomes: Adequate for Discharge   LCSW Treatment Plan for Primary Diagnosis: Bipolar disorder, current episode manic severe with psychotic features (HCC) Long Term Goal(s): Safe transition to appropriate next level of care at discharge, Engage patient in therapeutic group addressing interpersonal concerns.  Short Term Goals: Engage patient in aftercare planning with referrals and resources, Facilitate patient progression through stages of change regarding substance use diagnoses and concerns and Identify triggers associated with mental health/substance abuse issues  Therapeutic Interventions: Assess for all discharge needs, 1 to 1 time with Social worker, Explore available resources and support systems, Assess for adequacy in community support network, Educate family and significant other(s) on suicide prevention,  Complete Psychosocial Assessment, Interpersonal group therapy.  Evaluation of Outcomes: Adequate for Discharge   Progress in Treatment: Attending groups: Yes. Participating in groups: Yes. Taking medication as prescribed: Yes. Toleration medication: Yes. Family/Significant other contact made: No, will contact:  family member if patient consents Patient understands diagnosis: No. Minimal insight; poor historian at this time.  Discussing patient identified problems/goals with staff: No. Medical problems stabilized or resolved: No. Denies suicidal/homicidal ideation: Yes. Issues/concerns per patient self-inventory: No. Other: n/a  New problem(s) identified: Yes, Describe:  poor insight; poor historian  New Short Term/Long Term Goal(s): detox; medication stabilization; development of comprehensive mental wellness/sobriety plan.   Discharge Plan or Barriers: CSW assessing for appropriate referrals. Pt reports that she lives at cousins' home and sleeps on a cot. Sees Dr. Evelene Croon every 6 months for medication management.  Reason for Continuation of Hospitalization:    Estimated  Length of Stay: D/C today  Attendees: Patient: 07/07/2016 9:47 AM  Physician: Dr. Elna Breslow MD 07/07/2016 9:47 AM  Nursing: Antoine Primas RN 07/07/2016 9:47 AM  RN Care Manager: Onnie Boer CM 07/07/2016 9:47 AM  Social Worker:  Daryel Gerald LCSW 07/07/2016 9:47 AM  Recreational Therapist: [name], LRT/CTRS 07/07/2016 9:47 AM  Other: Armandina Stammer NP; Gray Bernhardt NP 07/07/2016 9:47 AM  Other:  07/07/2016 9:47 AM  Other: 07/07/2016 9:47 AM    Scribe for Treatment Team: Ida Rogue, LCSW 07/07/2016 9:47 AM
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
## 2016-07-07 NOTE — Plan of Care (Signed)
[facility] Goal: STG-Patient will identify at least five coping skills for ** STG: Coping Skills - Patient will be able to identify at least 5 coping skills for anxiety by conclusion of recreation therapy tx  Outcome: Adequate for Discharge Pt was able to identify coping skills after completion of anger management recreation therapy session.  Leslie RancherMarjette Happy [place], LRT/CTRS

## 2017-07-03 DIAGNOSIS — J309 Allergic rhinitis, unspecified: Secondary | ICD-10-CM | POA: Insufficient documentation

## 2017-07-03 DIAGNOSIS — F419 Anxiety disorder, unspecified: Secondary | ICD-10-CM | POA: Insufficient documentation

## 2017-07-03 DIAGNOSIS — F1111 Opioid abuse, in remission: Secondary | ICD-10-CM | POA: Insufficient documentation

## 2017-07-04 DIAGNOSIS — B182 Chronic viral hepatitis C: Secondary | ICD-10-CM | POA: Insufficient documentation

## 2017-11-06 DIAGNOSIS — F1911 Other psychoactive substance abuse, in remission: Secondary | ICD-10-CM | POA: Insufficient documentation

## 2017-11-07 ENCOUNTER — Other Ambulatory Visit: Payer: Self-pay | Admitting: Nurse Practitioner

## 2017-11-07 DIAGNOSIS — B182 Chronic viral hepatitis C: Secondary | ICD-10-CM

## 2017-11-23 ENCOUNTER — Other Ambulatory Visit: Payer: Self-pay

## 2018-02-01 IMAGING — CR DG FOOT 2V*L*
2 series · 2 of 2 positions shown · non-contrast
Comparison: None.

CLINICAL DATA: Laceration 2 bottom of foot.

EXAM:
LEFT FOOT - 2 VIEW

[x foot ap left]
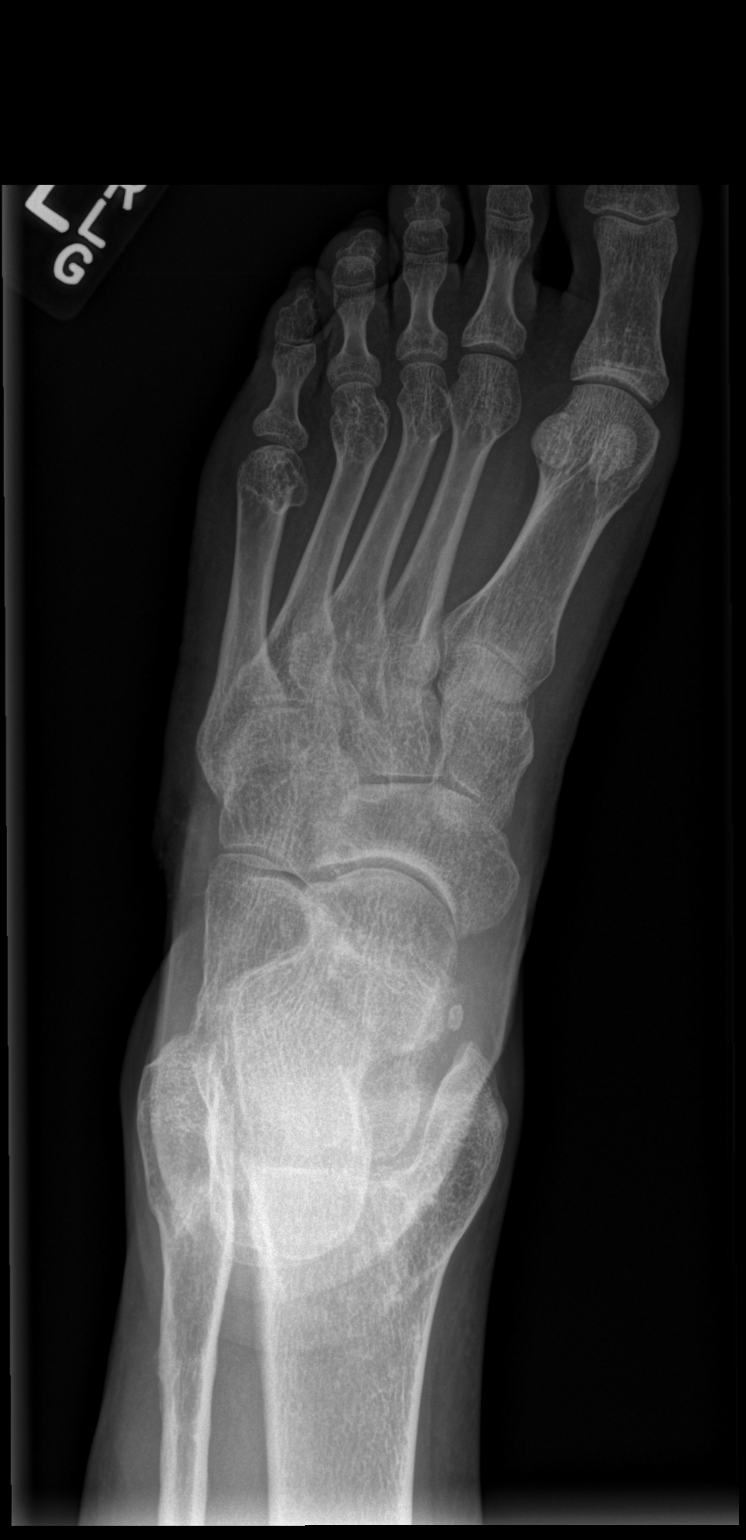

[x foot lat left]
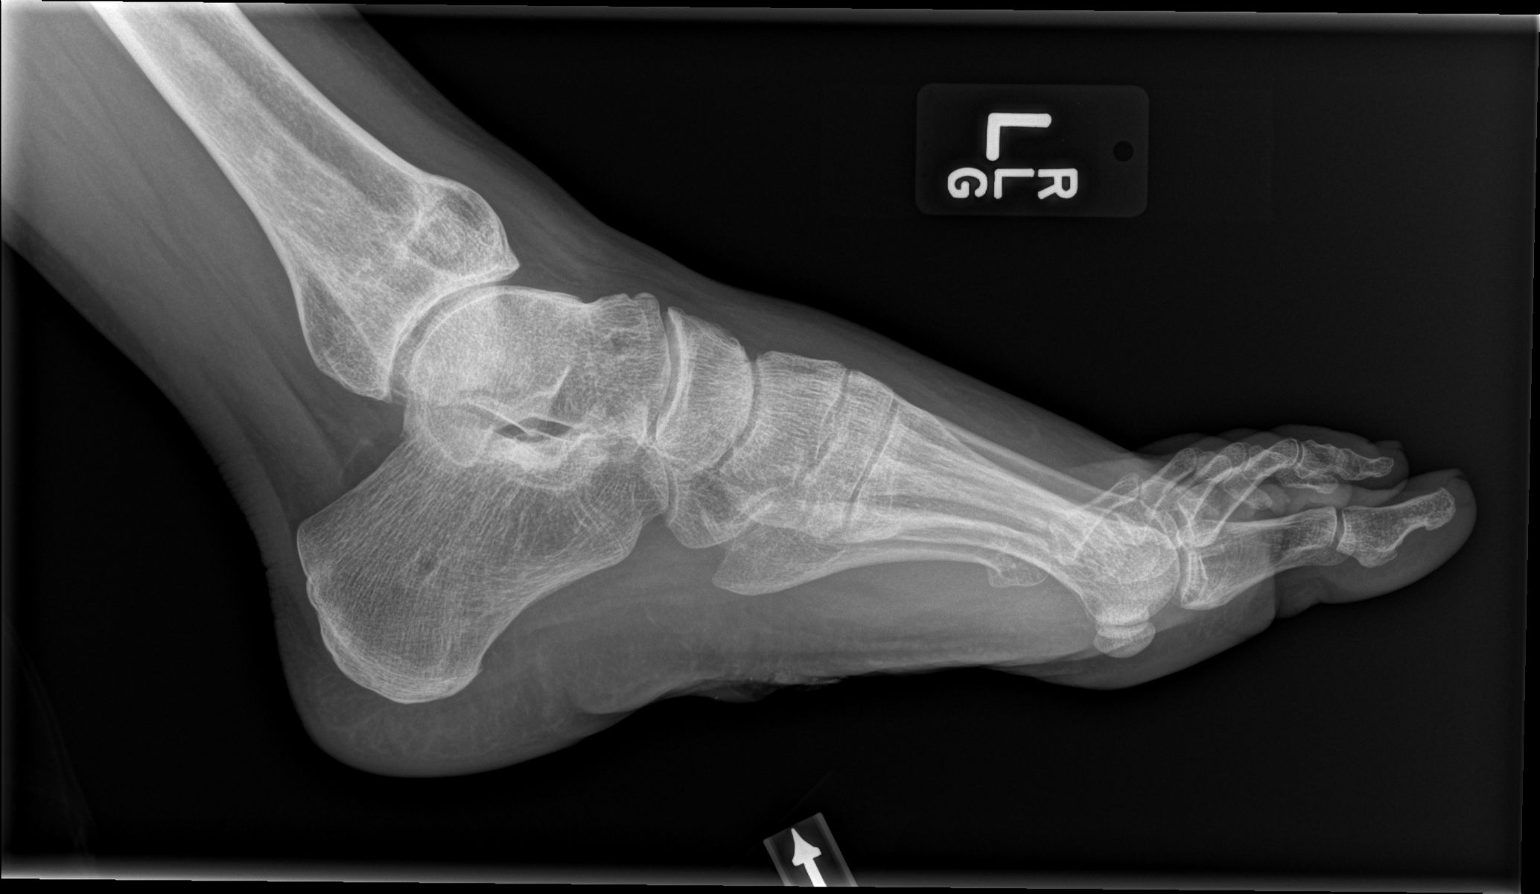

[2 of 2 positions shown; findings below may reference images not displayed]

FINDINGS: Two foci of high attenuation are seen in the soft tissues adjacent
to the laceration. No acute fracture or dislocation.
IMPRESSION: Two radiopaque foci seen in the region of the plantar laceration.
These could be either on the skin or just deep to the skin.

## 2018-06-07 ENCOUNTER — Emergency Department (HOSPITAL_COMMUNITY)
Admission: EM | Admit: 2018-06-07 | Discharge: 2018-06-08 | Discharge status: Against Medical Advice | Payer: Medicaid Other | Attending: Emergency Medicine | Admitting: Emergency Medicine

## 2018-06-07 ENCOUNTER — Emergency Department (HOSPITAL_COMMUNITY): Payer: Medicaid Other

## 2018-06-07 ENCOUNTER — Encounter (HOSPITAL_COMMUNITY): Payer: Self-pay | Admitting: Emergency Medicine

## 2018-06-07 ENCOUNTER — Other Ambulatory Visit: Payer: Self-pay

## 2018-06-07 DIAGNOSIS — Z5320 Procedure and treatment not carried out because of patient's decision for unspecified reasons: Secondary | ICD-10-CM | POA: Diagnosis not present

## 2018-06-07 DIAGNOSIS — N390 Urinary tract infection, site not specified: Secondary | ICD-10-CM | POA: Diagnosis not present

## 2018-06-07 DIAGNOSIS — F1990 Other psychoactive substance use, unspecified, uncomplicated: Secondary | ICD-10-CM | POA: Diagnosis not present

## 2018-06-07 DIAGNOSIS — F909 Attention-deficit hyperactivity disorder, unspecified type: Secondary | ICD-10-CM | POA: Insufficient documentation

## 2018-06-07 DIAGNOSIS — F17210 Nicotine dependence, cigarettes, uncomplicated: Secondary | ICD-10-CM | POA: Insufficient documentation

## 2018-06-07 DIAGNOSIS — T401X1A Poisoning by heroin, accidental (unintentional), initial encounter: Secondary | ICD-10-CM | POA: Insufficient documentation

## 2018-06-07 DIAGNOSIS — R000 Tachycardia, unspecified: Secondary | ICD-10-CM | POA: Insufficient documentation

## 2018-06-07 DIAGNOSIS — R350 Frequency of micturition: Secondary | ICD-10-CM | POA: Insufficient documentation

## 2018-06-07 DIAGNOSIS — I10 Essential (primary) hypertension: Secondary | ICD-10-CM

## 2018-06-07 DIAGNOSIS — Z79899 Other long term (current) drug therapy: Secondary | ICD-10-CM | POA: Diagnosis not present

## 2018-06-07 DIAGNOSIS — F319 Bipolar disorder, unspecified: Secondary | ICD-10-CM | POA: Insufficient documentation

## 2018-06-07 LAB — COMPREHENSIVE METABOLIC PANEL
ALT: 42 U/L (ref 0–44)
AST: 32 U/L (ref 15–41)
Albumin: 4.2 g/dL (ref 3.5–5.0)
Alkaline Phosphatase: 71 U/L (ref 38–126)
Anion gap: 12 (ref 5–15)
BUN: 10 mg/dL (ref 6–20)
CO2: 26 mmol/L (ref 22–32)
Calcium: 9.6 mg/dL (ref 8.9–10.3)
Chloride: 99 mmol/L (ref 98–111)
Creatinine, Ser: 0.87 mg/dL (ref 0.44–1.00)
GFR calc Af Amer: >60 mL/min (ref >60)
GFR calc non Af Amer: >60 mL/min (ref >60)
Glucose, Bld: 97 mg/dL (ref 70–99)
Potassium: 4.6 mmol/L (ref 3.5–5.1)
Sodium: 137 mmol/L (ref 135–145)
Total Bilirubin: 0.4 mg/dL (ref 0.3–1.2)
Total Protein: 7.5 g/dL (ref 6.5–8.1)

## 2018-06-07 LAB — URINALYSIS, ROUTINE W REFLEX MICROSCOPIC
Bilirubin Urine: NEGATIVE
Glucose, UA: NEGATIVE mg/dL
Hgb urine dipstick: NEGATIVE
Ketones, ur: NEGATIVE mg/dL
Nitrite: POSITIVE — AB
Protein, ur: NEGATIVE mg/dL
Specific Gravity, Urine: 1.015 (ref 1.005–1.030)
WBC, UA: >50 WBC/hpf — ABNORMAL HIGH (ref 0–5)
pH: 6 (ref 5.0–8.0)

## 2018-06-07 LAB — CBC WITH DIFFERENTIAL/PLATELET
Abs Immature Granulocytes: 0.05 10*3/uL (ref 0.00–0.07)
Basophils Absolute: 0.1 10*3/uL (ref 0.0–0.1)
Basophils Relative: 1 %
Eosinophils Absolute: 0.3 10*3/uL (ref 0.0–0.5)
Eosinophils Relative: 2 %
HCT: 38.6 % (ref 36.0–46.0)
Hemoglobin: 12.6 g/dL (ref 12.0–15.0)
Immature Granulocytes: 0 %
Lymphocytes Relative: 11 %
Lymphs Abs: 1.6 10*3/uL (ref 0.7–4.0)
MCH: 28.8 pg (ref 26.0–34.0)
MCHC: 32.6 g/dL (ref 30.0–36.0)
MCV: 88.3 fL (ref 80.0–100.0)
Monocytes Absolute: 0.4 10*3/uL (ref 0.1–1.0)
Monocytes Relative: 3 %
Neutro Abs: 11.9 10*3/uL — ABNORMAL HIGH (ref 1.7–7.7)
Neutrophils Relative %: 83 %
Platelets: 386 10*3/uL (ref 150–400)
RBC: 4.37 MIL/uL (ref 3.87–5.11)
RDW: 11.9 % (ref 11.5–15.5)
WBC: 14.4 10*3/uL — ABNORMAL HIGH (ref 4.0–10.5)
nRBC: 0 % (ref 0.0–0.2)

## 2018-06-07 LAB — I-STAT BETA HCG BLOOD, ED (MC, WL, AP ONLY): I-stat hCG, quantitative: <5 m[IU]/mL (ref <5)

## 2018-06-07 LAB — ACETAMINOPHEN LEVEL: Acetaminophen (Tylenol), Serum: <10 ug/mL — ABNORMAL LOW (ref 10–30)

## 2018-06-07 LAB — SALICYLATE LEVEL: Salicylate Lvl: <7 mg/dL (ref 2.8–30.0)

## 2018-06-07 LAB — CBG MONITORING, ED: Glucose-Capillary: 99 mg/dL (ref 70–99)

## 2018-06-07 LAB — ETHANOL: Alcohol, Ethyl (B): <10 mg/dL (ref <10)

## 2018-06-07 MED ORDER — SODIUM CHLORIDE 0.9 % IV BOLUS
500.0000 mL | Freq: Once | INTRAVENOUS | Status: AC
  Administered 2018-06-08: 500 mL via INTRAVENOUS

## 2018-06-07 NOTE — ED Notes (Signed)
Leslie [name], Significant Other [phone]

## 2018-06-07 NOTE — ED Notes (Signed)
Attempted to gain IV access x2, without success. Seeking additional assistance.
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
## 2018-06-07 NOTE — ED Notes (Addendum)
2nd RN made attempt at IV access, also without success. Placing order for IV Team assistance. Phlebotomy at bedside for labs.
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
## 2018-06-08 ENCOUNTER — Emergency Department (HOSPITAL_COMMUNITY): Payer: Medicaid Other

## 2018-06-08 LAB — RAPID URINE DRUG SCREEN, HOSP PERFORMED
Amphetamines: POSITIVE — AB
Barbiturates: NOT DETECTED
Benzodiazepines: POSITIVE — AB
Cocaine: NOT DETECTED
Opiates: POSITIVE — AB
Tetrahydrocannabinol: POSITIVE — AB

## 2018-06-08 LAB — LACTIC ACID, PLASMA: Lactic Acid, Venous: 1.2 mmol/L (ref 0.5–1.9)

## 2018-06-08 MED ORDER — CEPHALEXIN 500 MG PO CAPS: 500.0000 mg | ORAL_CAPSULE | Freq: Four times a day (QID) | ORAL | 0 refills | Status: DC

## 2018-06-08 MED ORDER — ONDANSETRON HCL 4 MG PO TABS: 4.0000 mg | ORAL_TABLET | Freq: Four times a day (QID) | ORAL | 0 refills | Status: DC

## 2018-06-08 MED ORDER — CLONIDINE HCL 0.1 MG PO TABS: ORAL_TABLET | ORAL | 0 refills | Status: DC

## 2018-06-08 MED ORDER — CLONIDINE HCL 0.1 MG PO TABS
0.1000 mg | ORAL_TABLET | Freq: Once | ORAL | Status: AC
  Administered 2018-06-08: 03:00:00 0.1 mg via ORAL
  Filled 2018-06-08: qty 1

## 2018-06-08 MED ORDER — LIDOCAINE HCL (PF) 1 % IJ SOLN
INTRAMUSCULAR | Status: AC
  Administered 2018-06-08: 03:00:00 2.1 mL
  Filled 2018-06-08: qty 5

## 2018-06-08 MED ORDER — SODIUM CHLORIDE 0.9 % IV SOLN
1.0000 g | Freq: Once | INTRAVENOUS | Status: DC
  Filled 2018-06-08: qty 10

## 2018-06-08 MED ORDER — ONDANSETRON 4 MG PO TBDP
4.0000 mg | ORAL_TABLET | Freq: Once | ORAL | Status: AC
  Administered 2018-06-08: 03:00:00 4 mg via ORAL
  Filled 2018-06-08: qty 1

## 2018-06-08 MED ORDER — IOHEXOL 300 MG/ML  SOLN
100.0000 mL | Freq: Once | INTRAMUSCULAR | Status: AC | PRN
  Administered 2018-06-08: 100 mL via INTRAVENOUS

## 2018-06-08 MED ORDER — LACTATED RINGERS IV BOLUS
1000.0000 mL | Freq: Once | INTRAVENOUS | Status: AC
  Administered 2018-06-08: 01:00:00 1000 mL via INTRAVENOUS

## 2018-06-08 MED ORDER — CEFTRIAXONE SODIUM 1 G IJ SOLR
1.0000 g | Freq: Once | INTRAMUSCULAR | Status: AC
  Administered 2018-06-08: 03:00:00 1 g via INTRAMUSCULAR
  Filled 2018-06-08: qty 10

## 2018-06-08 NOTE — ED Provider Notes (Signed)
1:05 AM Assumed care from Dr. Madilyn Hook, please see their note for full history, physical and decision making until this point. In brief this is a 38 y.o. year old female who presented to the ED tonight with Drug Overdose     Patient is here with tachycardia and hypertension thought to be likely secondary to substance abuse.  Allow him to sober up and reevaluate.  On multiple re-evaluations patient is alert and oriented.  She states she does use drugs and did use drugs in the last 24 hours.  She is not having any complaints but does state she has had some polyuria recently.  Her urine does show a likely urinary tract infection this associated with a white count, tachycardia CT scan done to evaluate for pyelonephritis or abscess which was negative.  Her heart rate started improved with fluids however patient requested to leave.  She was awake and alert through multiple conversations and understood the risk of leaving to include death, sepsis, worsening infection requested be discharged.  Will start on antibiotics, PRN clonidine for withdrawal symptoms and peer support.  Despite the best efforts of this physician, the patient insisted on leaving prior to completing diagnostic workup and treatment.  Risks of not completing treatment up to and including death were explained and the patient states that they understand. The patient was able to repeat these risks back to me in their own words.  The patient is alert, oriented, and appears to possess the capacity to make this decision at this time. They are not slurring their speech, and do not appear intoxicated. They are appropriately conversant, their oxygenation saturation's are within normal limits, and I feel their illness is unlikely to have any effect of their decision making capacity at this time.  The patient was welcomed and encouraged to return to the ED or seek treatment in the event that they desire treatment. The patient was asked to sign our hospital  AMA form prior to leaving.   Labs, studies and imaging reviewed by myself and considered in medical decision making if ordered. Imaging interpreted by radiology.  Labs Reviewed  CBC WITH DIFFERENTIAL/PLATELET - Abnormal; Notable for the following components:      Result Value   WBC 14.4 (*)    Neutro Abs 11.9 (*)    All other components within normal limits  ACETAMINOPHEN LEVEL - Abnormal; Notable for the following components:   Acetaminophen (Tylenol), Serum <10 (*)    All other components within normal limits  URINALYSIS, ROUTINE W REFLEX MICROSCOPIC - Abnormal; Notable for the following components:   APPearance CLOUDY (*)    Nitrite POSITIVE (*)    Leukocytes,Ua LARGE (*)    WBC, UA >50 (*)    Bacteria, UA FEW (*)    Non Squamous Epithelial 0-5 (*)    All other components within normal limits  CULTURE, BLOOD (ROUTINE X 2)  CULTURE, BLOOD (ROUTINE X 2)  URINE CULTURE  COMPREHENSIVE METABOLIC PANEL  SALICYLATE LEVEL  ETHANOL  RAPID URINE DRUG SCREEN, HOSP PERFORMED  LACTIC ACID, PLASMA  LACTIC ACID, PLASMA  CBG MONITORING, ED  I-STAT BETA HCG BLOOD, ED ([facility], [facility], [facility] ONLY)    DG Chest Port 1 View  Final Result    CT Head Wo Contrast    (Results Pending)  CT ABDOMEN PELVIS W CONTRAST    (Results Pending)    No follow-ups on file.    Leslie [name], Barbara Cower, MD 06/08/18 [phone]

## 2018-06-08 NOTE — ED Notes (Signed)
Pt refusing IV infusion of rocephin, stating that she has to get home. Pt agrees to IM dose & other meds suggested by MD, but continues to refuse to stay for further evaluation and treatment. MD and this RN discuss risks & responsibilities of leaving AMA. Pt is alert, oriented, and demonstrates capacity to make informed decision, and continues to request to leave AMA. Pt signed for AMA. AVS reviewed with pt, including prescribed meds. Pt notes understanding and has no additional questions. Pt departed in NAD, refused use of wheelchair.

## 2018-06-08 NOTE — ED Notes (Signed)
Patient transported to CT 

## 2018-06-08 NOTE — ED Notes (Signed)
Delay in lab draw,  Pt currently in CT 

## 2018-06-09 DIAGNOSIS — E038 Other specified hypothyroidism: Secondary | ICD-10-CM | POA: Insufficient documentation

## 2018-06-10 LAB — URINE CULTURE: Culture: >=100000 — AB

## 2018-06-11 ENCOUNTER — Telehealth: Payer: Self-pay

## 2018-06-11 NOTE — Telephone Encounter (Signed)
Post ED Visit - Positive Culture Follow-up  Culture report reviewed by antimicrobial stewardship pharmacist: [facility] []  Enzo Bi, Pharm.D. []  Celedonio Miyamoto, Pharm.D., BCPS AQ-ID []  Garvin Fila, Pharm.D., BCPS []  Georgina Pillion, Pharm.D., BCPS []  [place], [address].D., BCPS, AAHIVP []  Estella Husk, Pharm.D., BCPS, AAHIVP []  Lysle Pearl, PharmD, BCPS []  Phillips Climes, PharmD, BCPS []  Agapito Games, PharmD, BCPS []  Verlan Friends, PharmD []  Mervyn Gay, PharmD, BCPS []  Vinnie Level, PharmD [facility] []  Len Childs, PharmD []  Greer Pickerel, PharmD []  Adalberto Cole, PharmD []  Perlie Gold, Rph []  Lonell Face) Jean Rosenthal, PharmD []  Earl Many, PharmD []  Junita Push, PharmD []  Dorna Leitz, PharmD []  Terrilee Files, PharmD []  Lynann Beaver, PharmD []  Keturah Barre, PharmD []  Loralee Pacas, PharmD []  Bernadene Person, PharmD   Positive urine culture Treated with Cephalexin, organism sensitive to the same and no further patient follow-up is required at this time.  Leslie [name] 06/11/2018, 8:42 AM

## 2018-06-13 LAB — CULTURE, BLOOD (ROUTINE X 2)
Culture: NO GROWTH
Culture: NO GROWTH
Special Requests: ADEQUATE

## 2018-06-16 ENCOUNTER — Encounter (HOSPITAL_COMMUNITY): Payer: Self-pay

## 2018-06-16 ENCOUNTER — Emergency Department (HOSPITAL_COMMUNITY)
Admission: EM | Admit: 2018-06-16 | Discharge: 2018-06-16 | Disposition: A | Discharge status: Home / Self Care | Payer: Medicaid Other | Attending: Emergency Medicine | Admitting: Emergency Medicine

## 2018-06-16 ENCOUNTER — Other Ambulatory Visit: Payer: Self-pay

## 2018-06-16 DIAGNOSIS — Z79899 Other long term (current) drug therapy: Secondary | ICD-10-CM | POA: Diagnosis not present

## 2018-06-16 DIAGNOSIS — F3189 Other bipolar disorder: Secondary | ICD-10-CM | POA: Diagnosis not present

## 2018-06-16 DIAGNOSIS — F909 Attention-deficit hyperactivity disorder, unspecified type: Secondary | ICD-10-CM | POA: Diagnosis not present

## 2018-06-16 DIAGNOSIS — Z760 Encounter for issue of repeat prescription: Secondary | ICD-10-CM | POA: Diagnosis not present

## 2018-06-16 DIAGNOSIS — R443 Hallucinations, unspecified: Secondary | ICD-10-CM

## 2018-06-16 DIAGNOSIS — Z0489 Encounter for examination and observation for other specified reasons: Secondary | ICD-10-CM | POA: Diagnosis present

## 2018-06-16 DIAGNOSIS — F17210 Nicotine dependence, cigarettes, uncomplicated: Secondary | ICD-10-CM | POA: Diagnosis not present

## 2018-06-16 MED ORDER — HALOPERIDOL 10 MG PO TABS: 10.0000 mg | ORAL_TABLET | Freq: Every day | ORAL | 1 refills | Status: DC

## 2018-06-16 MED ORDER — BENZTROPINE MESYLATE 0.5 MG PO TABS: 0.5000 mg | ORAL_TABLET | Freq: Every day | ORAL | 1 refills | Status: DC

## 2018-06-16 NOTE — Discharge Instructions (Addendum)
Make sure you also take the keflex (cephalexin) you were just recently prescribed for a urinary tract infection.

## 2018-06-16 NOTE — ED Triage Notes (Addendum)
States hearing voices and seeing shadows no SI or HI out of medications which include haldol, valium, depakote, and adderal.  Put speeking fast and hard for patient to think. Only took one dose of keflex she was prescribed here on 06/07/2018 for UTI because it made her feel funny. Pt has Hep C and states she is too busy to get the treatment for it.

## 2018-06-16 NOTE — ED Notes (Signed)
Bed: WTR5 Expected date:  Expected time:  Means of arrival:  Comments: 

## 2018-06-17 ENCOUNTER — Telehealth: Payer: Self-pay

## 2018-06-17 NOTE — Telephone Encounter (Signed)
Patient called and advised she was possibly exposed to an employee who later tested positive for COVID-19, patient says she's not having any symptoms. I asked if she would be willing to take a test, she agreed. I advised it will be done at [facility] during the weekday Monday through Friday, she says she will come on Monday. I advised I will need to scheduled the appointment, she asked me to call her back with the appointment. I attempted to her back after pulling up the appointment schedule, she did not answer, left VM to return call to [phone] to schedule the appointment.
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
## 2018-06-19 ENCOUNTER — Telehealth: Payer: Self-pay | Admitting: *Deleted

## 2018-06-19 NOTE — Telephone Encounter (Signed)
Left VM to return call. Patient had [place] visit on 06/07/18 with possible exposure.

## 2019-05-31 DIAGNOSIS — N200 Calculus of kidney: Secondary | ICD-10-CM | POA: Insufficient documentation

## 2019-06-02 DIAGNOSIS — N12 Tubulo-interstitial nephritis, not specified as acute or chronic: Secondary | ICD-10-CM | POA: Insufficient documentation

## 2019-07-25 ENCOUNTER — Observation Stay (HOSPITAL_COMMUNITY)
Admission: EM | Admit: 2019-07-25 | Discharge: 2019-07-25 | Discharge status: Against Medical Advice | Payer: Medicaid Other | Attending: Internal Medicine | Admitting: Internal Medicine

## 2019-07-25 ENCOUNTER — Encounter (HOSPITAL_COMMUNITY): Payer: Self-pay | Admitting: Radiology

## 2019-07-25 ENCOUNTER — Emergency Department (HOSPITAL_COMMUNITY): Payer: Medicaid Other

## 2019-07-25 ENCOUNTER — Other Ambulatory Visit: Payer: Self-pay

## 2019-07-25 DIAGNOSIS — Z79899 Other long term (current) drug therapy: Secondary | ICD-10-CM | POA: Diagnosis not present

## 2019-07-25 DIAGNOSIS — S0083XA Contusion of other part of head, initial encounter: Secondary | ICD-10-CM | POA: Insufficient documentation

## 2019-07-25 DIAGNOSIS — G9341 Metabolic encephalopathy: Secondary | ICD-10-CM | POA: Diagnosis not present

## 2019-07-25 DIAGNOSIS — R000 Tachycardia, unspecified: Secondary | ICD-10-CM | POA: Insufficient documentation

## 2019-07-25 DIAGNOSIS — F17210 Nicotine dependence, cigarettes, uncomplicated: Secondary | ICD-10-CM | POA: Insufficient documentation

## 2019-07-25 DIAGNOSIS — F988 Other specified behavioral and emotional disorders with onset usually occurring in childhood and adolescence: Secondary | ICD-10-CM | POA: Diagnosis not present

## 2019-07-25 DIAGNOSIS — Z79890 Hormone replacement therapy (postmenopausal): Secondary | ICD-10-CM | POA: Insufficient documentation

## 2019-07-25 DIAGNOSIS — F17200 Nicotine dependence, unspecified, uncomplicated: Secondary | ICD-10-CM | POA: Diagnosis present

## 2019-07-25 DIAGNOSIS — Z20822 Contact with and (suspected) exposure to covid-19: Secondary | ICD-10-CM | POA: Insufficient documentation

## 2019-07-25 DIAGNOSIS — R4189 Other symptoms and signs involving cognitive functions and awareness: Secondary | ICD-10-CM

## 2019-07-25 DIAGNOSIS — S0011XA Contusion of right eyelid and periocular area, initial encounter: Secondary | ICD-10-CM | POA: Diagnosis not present

## 2019-07-25 DIAGNOSIS — F319 Bipolar disorder, unspecified: Secondary | ICD-10-CM | POA: Diagnosis not present

## 2019-07-25 DIAGNOSIS — F1910 Other psychoactive substance abuse, uncomplicated: Secondary | ICD-10-CM | POA: Diagnosis not present

## 2019-07-25 DIAGNOSIS — H5711 Ocular pain, right eye: Secondary | ICD-10-CM | POA: Insufficient documentation

## 2019-07-25 DIAGNOSIS — Z888 Allergy status to other drugs, medicaments and biological substances status: Secondary | ICD-10-CM | POA: Insufficient documentation

## 2019-07-25 DIAGNOSIS — R4182 Altered mental status, unspecified: Secondary | ICD-10-CM | POA: Diagnosis present

## 2019-07-25 LAB — RAPID URINE DRUG SCREEN, HOSP PERFORMED
Amphetamines: POSITIVE — AB
Barbiturates: NOT DETECTED
Benzodiazepines: POSITIVE — AB
Cocaine: NOT DETECTED
Opiates: NOT DETECTED
Tetrahydrocannabinol: POSITIVE — AB

## 2019-07-25 LAB — CBC WITH DIFFERENTIAL/PLATELET
Abs Immature Granulocytes: 0.03 10*3/uL (ref 0.00–0.07)
Basophils Absolute: 0.1 10*3/uL (ref 0.0–0.1)
Basophils Relative: 1 %
Eosinophils Absolute: 0.1 10*3/uL (ref 0.0–0.5)
Eosinophils Relative: 1 %
HCT: 37.7 % (ref 36.0–46.0)
Hemoglobin: 12.4 g/dL (ref 12.0–15.0)
Immature Granulocytes: 0 %
Lymphocytes Relative: 13 %
Lymphs Abs: 1.5 10*3/uL (ref 0.7–4.0)
MCH: 29.1 pg (ref 26.0–34.0)
MCHC: 32.9 g/dL (ref 30.0–36.0)
MCV: 88.5 fL (ref 80.0–100.0)
Monocytes Absolute: 0.6 10*3/uL (ref 0.1–1.0)
Monocytes Relative: 5 %
Neutro Abs: 9.2 10*3/uL — ABNORMAL HIGH (ref 1.7–7.7)
Neutrophils Relative %: 80 %
Platelets: 331 10*3/uL (ref 150–400)
RBC: 4.26 MIL/uL (ref 3.87–5.11)
RDW: 12.3 % (ref 11.5–15.5)
WBC: 11.5 10*3/uL — ABNORMAL HIGH (ref 4.0–10.5)
nRBC: 0 % (ref 0.0–0.2)

## 2019-07-25 LAB — COMPREHENSIVE METABOLIC PANEL
ALT: 38 U/L (ref 0–44)
AST: 29 U/L (ref 15–41)
Albumin: 4.6 g/dL (ref 3.5–5.0)
Alkaline Phosphatase: 72 U/L (ref 38–126)
Anion gap: 9 (ref 5–15)
BUN: 13 mg/dL (ref 6–20)
CO2: 27 mmol/L (ref 22–32)
Calcium: 8.9 mg/dL (ref 8.9–10.3)
Chloride: 101 mmol/L (ref 98–111)
Creatinine, Ser: 0.98 mg/dL (ref 0.44–1.00)
GFR calc Af Amer: >60 mL/min (ref >60)
GFR calc non Af Amer: >60 mL/min (ref >60)
Glucose, Bld: 94 mg/dL (ref 70–99)
Potassium: 4.6 mmol/L (ref 3.5–5.1)
Sodium: 137 mmol/L (ref 135–145)
Total Bilirubin: 0.5 mg/dL (ref 0.3–1.2)
Total Protein: 8.2 g/dL — ABNORMAL HIGH (ref 6.5–8.1)

## 2019-07-25 LAB — TSH: TSH: 1.33 u[IU]/mL (ref 0.350–4.500)

## 2019-07-25 LAB — HCG, QUANTITATIVE, PREGNANCY: hCG, Beta Chain, Quant, S: <1 m[IU]/mL (ref <5)

## 2019-07-25 LAB — URINALYSIS, ROUTINE W REFLEX MICROSCOPIC
Bilirubin Urine: NEGATIVE
Glucose, UA: NEGATIVE mg/dL
Hgb urine dipstick: NEGATIVE
Ketones, ur: 5 mg/dL — AB
Leukocytes,Ua: NEGATIVE
Nitrite: NEGATIVE
Protein, ur: NEGATIVE mg/dL
Specific Gravity, Urine: 1.012 (ref 1.005–1.030)
pH: 7 (ref 5.0–8.0)

## 2019-07-25 LAB — I-STAT BETA HCG BLOOD, ED (MC, WL, AP ONLY): I-stat hCG, quantitative: <5 m[IU]/mL (ref <5)

## 2019-07-25 LAB — ACETAMINOPHEN LEVEL: Acetaminophen (Tylenol), Serum: <10 ug/mL — ABNORMAL LOW (ref 10–30)

## 2019-07-25 LAB — SARS CORONAVIRUS 2 BY RT PCR (HOSPITAL ORDER, PERFORMED IN ~~LOC~~ HOSPITAL LAB): SARS Coronavirus 2: NEGATIVE

## 2019-07-25 LAB — ETHANOL: Alcohol, Ethyl (B): <10 mg/dL (ref <10)

## 2019-07-25 LAB — SALICYLATE LEVEL: Salicylate Lvl: <7 mg/dL — ABNORMAL LOW (ref 7.0–30.0)

## 2019-07-25 LAB — T4, FREE: Free T4: 1.05 ng/dL (ref 0.61–1.12)

## 2019-07-25 MED ORDER — NICOTINE 14 MG/24HR TD PT24: 14.0000 mg | MEDICATED_PATCH | Freq: Every day | TRANSDERMAL | Status: DC

## 2019-07-25 MED ORDER — ONDANSETRON HCL 4 MG/2ML IJ SOLN: 4.0000 mg | Freq: Four times a day (QID) | INTRAMUSCULAR | Status: DC | PRN

## 2019-07-25 MED ORDER — SODIUM CHLORIDE 0.9 % IV BOLUS
1000.0000 mL | Freq: Once | INTRAVENOUS | Status: AC
  Administered 2019-07-25: 1000 mL via INTRAVENOUS

## 2019-07-25 MED ORDER — ACETAMINOPHEN 325 MG PO TABS: 650.0000 mg | ORAL_TABLET | Freq: Four times a day (QID) | ORAL | Status: DC | PRN

## 2019-07-25 MED ORDER — TRAZODONE HCL 100 MG PO TABS: 50.0000 mg | ORAL_TABLET | Freq: Every evening | ORAL | Status: DC | PRN

## 2019-07-25 MED ORDER — ONDANSETRON HCL 4 MG PO TABS: 4.0000 mg | ORAL_TABLET | Freq: Four times a day (QID) | ORAL | Status: DC | PRN

## 2019-07-25 MED ORDER — ACETAMINOPHEN 650 MG RE SUPP: 650.0000 mg | Freq: Four times a day (QID) | RECTAL | Status: DC | PRN
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
## 2019-07-25 NOTE — ED Notes (Signed)
Pt is alert and able to ambulate with a even and steady gait. Pt signed  AMA

## 2019-07-25 NOTE — ED Notes (Signed)
Pts mother, Guinevere Ferrari, updated per pt request.
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
## 2019-07-25 NOTE — ED Provider Notes (Signed)
Medical screening examination/treatment/procedure(s) were conducted as a shared visit with non-physician practitioner(s) and myself.  I personally evaluated the patient during the encounter.  EKG Interpretation  Date/Time:  Friday July 25 2019 12:01:20 EDT Ventricular Rate:  108 PR Interval:    QRS Duration: 83 QT Interval:  363 QTC Calculation: 487 R Axis:   81 Text Interpretation: Sinus tachycardia Borderline prolonged QT interval Confirmed by Lorre Nick ([id number]) on 07/25/2019 12:43:61 PM   39 year old female here with potential overdose.  She is on multiple medications.  On exam she is arousable to deep stimulus but is not verbal.  Will check labs and head CT and reevaluate and likely admit   Lorre Nick, MD 07/25/19 1255

## 2019-07-25 NOTE — ED Notes (Signed)
Pt provided with 3 warm blankets

## 2019-07-25 NOTE — ED Notes (Signed)
Pt found to have plastic bag full of white, crystallized substance.  [employer] confiscated substance.

## 2019-07-25 NOTE — ED Notes (Signed)
Pt provided with sprite, assisted to call mother with room phone.

## 2019-07-25 NOTE — ED Triage Notes (Signed)
Pt BIBA from home-  Per EMS- witnesses report "seeing woman in driveway, looks like she was OD'ing so we gave her Narcan.'Friends report giving total of 1.6 mg of Narcan IM prior to EMS arrival, EMS reports pt alert to verbal stimuli.   Pt arrives with [employer]-  Per [employer]- pt was knocking on door since 0630 today, homeowner denied access.  Homeowner reports she then sat in her car, tried knocking multiple times, then homeowner checked on her a few times, ultimately gave Narcan.  98% RA spO2 14 R 113 HR  106 CBG 160/-  --> palpated
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
## 2019-07-25 NOTE — ED Notes (Signed)
Called NP and she states that patient needs to sign AMA

## 2019-07-25 NOTE — ED Notes (Addendum)
Pt asleep, awoken with verbal stimuli.  Pt AOx2 (name, city), denies pain. Equal chest rise, voice hoarse. Pt with request to contact mother, Guinevere Ferrari.  RN will call.

## 2019-07-26 LAB — T3: T3, Total: 166 ng/dL (ref 71–180)

## 2020-01-04 ENCOUNTER — Emergency Department (HOSPITAL_COMMUNITY)
Admission: EM | Admit: 2020-01-04 | Discharge: 2020-01-05 | Disposition: A | Discharge status: Home / Self Care | Payer: Medicaid Other | Attending: Emergency Medicine | Admitting: Emergency Medicine

## 2020-01-04 ENCOUNTER — Emergency Department (HOSPITAL_COMMUNITY): Payer: Medicaid Other

## 2020-01-04 ENCOUNTER — Encounter (HOSPITAL_COMMUNITY): Payer: Self-pay | Admitting: Emergency Medicine

## 2020-01-04 ENCOUNTER — Other Ambulatory Visit: Payer: Self-pay

## 2020-01-04 DIAGNOSIS — F17210 Nicotine dependence, cigarettes, uncomplicated: Secondary | ICD-10-CM | POA: Diagnosis not present

## 2020-01-04 DIAGNOSIS — W109XXA Fall (on) (from) unspecified stairs and steps, initial encounter: Secondary | ICD-10-CM | POA: Diagnosis not present

## 2020-01-04 DIAGNOSIS — M79671 Pain in right foot: Secondary | ICD-10-CM | POA: Diagnosis present

## 2020-01-04 DIAGNOSIS — Z79899 Other long term (current) drug therapy: Secondary | ICD-10-CM | POA: Diagnosis not present

## 2020-01-04 DIAGNOSIS — T1490XA Injury, unspecified, initial encounter: Secondary | ICD-10-CM

## 2020-01-04 NOTE — ED Triage Notes (Signed)
Pt c/o R foot pain after falling on steps yesterday, pt reports she is unable to bare weight on foot and now had tingling in toes.

## 2020-01-05 NOTE — Discharge Instructions (Signed)
We recommend 600 mg ibuprofen every 6 hours for management of pain. Use crutches to prevent from putting weight on your right foot to limit persistent discomfort. Have your symptoms rechecked by your primary care doctor in 1 week. Return for new or concerning symptoms.
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
## 2020-01-27 ENCOUNTER — Ambulatory Visit: Payer: Medicaid Other | Admitting: Podiatry

## 2020-02-03 ENCOUNTER — Ambulatory Visit (INDEPENDENT_AMBULATORY_CARE_PROVIDER_SITE_OTHER): Payer: Medicaid Other

## 2020-02-03 ENCOUNTER — Other Ambulatory Visit: Payer: Self-pay

## 2020-02-03 ENCOUNTER — Ambulatory Visit: Payer: Medicaid Other | Admitting: Podiatry

## 2020-02-03 DIAGNOSIS — S9031XA Contusion of right foot, initial encounter: Secondary | ICD-10-CM | POA: Diagnosis not present

## 2020-02-03 DIAGNOSIS — R200 Anesthesia of skin: Secondary | ICD-10-CM

## 2020-02-03 DIAGNOSIS — R202 Paresthesia of skin: Secondary | ICD-10-CM | POA: Diagnosis not present

## 2020-02-03 MED ORDER — METHYLPREDNISOLONE 4 MG PO TBPK: ORAL_TABLET | ORAL | 0 refills | Status: DC
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
## 2020-02-09 ENCOUNTER — Encounter: Payer: Self-pay | Admitting: Neurology

## 2020-03-02 ENCOUNTER — Ambulatory Visit: Payer: Medicaid Other | Admitting: Podiatry
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
## 2020-04-29 ENCOUNTER — Ambulatory Visit: Payer: Medicaid Other | Admitting: Neurology

## 2020-04-29 ENCOUNTER — Other Ambulatory Visit: Payer: Self-pay

## 2020-04-29 ENCOUNTER — Encounter: Payer: Self-pay | Admitting: Neurology

## 2020-04-29 VITALS — BP 106/60 | HR 108 | Ht 64.0 in | Wt 139.4 lb

## 2020-04-29 DIAGNOSIS — R200 Anesthesia of skin: Secondary | ICD-10-CM | POA: Diagnosis not present

## 2020-04-29 NOTE — Patient Instructions (Signed)
If your symptoms get worse, please come back and see me   

## 2020-09-10 ENCOUNTER — Emergency Department (HOSPITAL_COMMUNITY)
Admission: EM | Admit: 2020-09-10 | Discharge: 2020-09-10 | Disposition: A | Discharge status: Home / Self Care | Payer: Medicaid Other | Attending: Emergency Medicine | Admitting: Emergency Medicine

## 2020-09-10 ENCOUNTER — Emergency Department (HOSPITAL_COMMUNITY): Payer: Medicaid Other

## 2020-09-10 ENCOUNTER — Other Ambulatory Visit: Payer: Self-pay

## 2020-09-10 ENCOUNTER — Encounter (HOSPITAL_COMMUNITY): Payer: Self-pay

## 2020-09-10 DIAGNOSIS — R000 Tachycardia, unspecified: Secondary | ICD-10-CM | POA: Diagnosis not present

## 2020-09-10 DIAGNOSIS — F1910 Other psychoactive substance abuse, uncomplicated: Secondary | ICD-10-CM

## 2020-09-10 DIAGNOSIS — T50901A Poisoning by unspecified drugs, medicaments and biological substances, accidental (unintentional), initial encounter: Secondary | ICD-10-CM | POA: Insufficient documentation

## 2020-09-10 DIAGNOSIS — R4182 Altered mental status, unspecified: Secondary | ICD-10-CM | POA: Insufficient documentation

## 2020-09-10 DIAGNOSIS — Z20822 Contact with and (suspected) exposure to covid-19: Secondary | ICD-10-CM | POA: Insufficient documentation

## 2020-09-10 DIAGNOSIS — E039 Hypothyroidism, unspecified: Secondary | ICD-10-CM | POA: Diagnosis not present

## 2020-09-10 DIAGNOSIS — F17210 Nicotine dependence, cigarettes, uncomplicated: Secondary | ICD-10-CM | POA: Diagnosis not present

## 2020-09-10 DIAGNOSIS — D72829 Elevated white blood cell count, unspecified: Secondary | ICD-10-CM

## 2020-09-10 DIAGNOSIS — O0289 Other abnormal products of conception: Secondary | ICD-10-CM | POA: Insufficient documentation

## 2020-09-10 DIAGNOSIS — N3000 Acute cystitis without hematuria: Secondary | ICD-10-CM

## 2020-09-10 LAB — CBC WITH DIFFERENTIAL/PLATELET
Abs Immature Granulocytes: 0.12 10*3/uL — ABNORMAL HIGH (ref 0.00–0.07)
Basophils Absolute: 0.1 10*3/uL (ref 0.0–0.1)
Basophils Relative: 1 %
Eosinophils Absolute: 0 10*3/uL (ref 0.0–0.5)
Eosinophils Relative: 0 %
HCT: 39.1 % (ref 36.0–46.0)
Hemoglobin: 12.7 g/dL (ref 12.0–15.0)
Immature Granulocytes: 1 %
Lymphocytes Relative: 6 %
Lymphs Abs: 1.1 10*3/uL (ref 0.7–4.0)
MCH: 29 pg (ref 26.0–34.0)
MCHC: 32.5 g/dL (ref 30.0–36.0)
MCV: 89.3 fL (ref 80.0–100.0)
Monocytes Absolute: 0.8 10*3/uL (ref 0.1–1.0)
Monocytes Relative: 4 %
Neutro Abs: 17.4 10*3/uL — ABNORMAL HIGH (ref 1.7–7.7)
Neutrophils Relative %: 88 %
Platelets: 335 10*3/uL (ref 150–400)
RBC: 4.38 MIL/uL (ref 3.87–5.11)
RDW: 11.8 % (ref 11.5–15.5)
WBC: 19.6 10*3/uL — ABNORMAL HIGH (ref 4.0–10.5)
nRBC: 0 % (ref 0.0–0.2)

## 2020-09-10 LAB — I-STAT CHEM 8, ED
BUN: 17 mg/dL (ref 6–20)
Calcium, Ion: 1.2 mmol/L (ref 1.15–1.40)
Chloride: 105 mmol/L (ref 98–111)
Creatinine, Ser: 0.8 mg/dL (ref 0.44–1.00)
Glucose, Bld: 99 mg/dL (ref 70–99)
HCT: 39 % (ref 36.0–46.0)
Hemoglobin: 13.3 g/dL (ref 12.0–15.0)
Potassium: 4 mmol/L (ref 3.5–5.1)
Sodium: 139 mmol/L (ref 135–145)
TCO2: 27 mmol/L (ref 22–32)

## 2020-09-10 LAB — URINALYSIS, ROUTINE W REFLEX MICROSCOPIC
Bilirubin Urine: NEGATIVE
Glucose, UA: NEGATIVE mg/dL
Hgb urine dipstick: NEGATIVE
Ketones, ur: 20 mg/dL — AB
Nitrite: NEGATIVE
Protein, ur: 30 mg/dL — AB
Specific Gravity, Urine: 1.013 (ref 1.005–1.030)
WBC, UA: >50 WBC/hpf — ABNORMAL HIGH (ref 0–5)
pH: 6 (ref 5.0–8.0)

## 2020-09-10 LAB — RAPID URINE DRUG SCREEN, HOSP PERFORMED
Amphetamines: POSITIVE — AB
Barbiturates: NOT DETECTED
Benzodiazepines: POSITIVE — AB
Cocaine: POSITIVE — AB
Opiates: NOT DETECTED
Tetrahydrocannabinol: POSITIVE — AB

## 2020-09-10 LAB — COMPREHENSIVE METABOLIC PANEL
ALT: 30 U/L (ref 0–44)
AST: 28 U/L (ref 15–41)
Albumin: 3.8 g/dL (ref 3.5–5.0)
Alkaline Phosphatase: 54 U/L (ref 38–126)
Anion gap: 9 (ref 5–15)
BUN: 15 mg/dL (ref 6–20)
CO2: 22 mmol/L (ref 22–32)
Calcium: 9.2 mg/dL (ref 8.9–10.3)
Chloride: 104 mmol/L (ref 98–111)
Creatinine, Ser: 0.86 mg/dL (ref 0.44–1.00)
GFR, Estimated: >60 mL/min (ref >60)
Glucose, Bld: 98 mg/dL (ref 70–99)
Potassium: 4 mmol/L (ref 3.5–5.1)
Sodium: 135 mmol/L (ref 135–145)
Total Bilirubin: 0.9 mg/dL (ref 0.3–1.2)
Total Protein: 7.5 g/dL (ref 6.5–8.1)

## 2020-09-10 LAB — CBG MONITORING, ED: Glucose-Capillary: 99 mg/dL (ref 70–99)

## 2020-09-10 LAB — RESP PANEL BY RT-PCR (FLU A&B, COVID) ARPGX2
Influenza A by PCR: NEGATIVE
Influenza B by PCR: NEGATIVE
SARS Coronavirus 2 by RT PCR: NEGATIVE

## 2020-09-10 LAB — SALICYLATE LEVEL: Salicylate Lvl: <7 mg/dL — ABNORMAL LOW (ref 7.0–30.0)

## 2020-09-10 LAB — I-STAT BETA HCG BLOOD, ED (MC, WL, AP ONLY): I-stat hCG, quantitative: <5 m[IU]/mL (ref <5)

## 2020-09-10 LAB — ACETAMINOPHEN LEVEL: Acetaminophen (Tylenol), Serum: <10 ug/mL — ABNORMAL LOW (ref 10–30)

## 2020-09-10 LAB — CK: Total CK: 140 U/L (ref 38–234)

## 2020-09-10 LAB — ETHANOL: Alcohol, Ethyl (B): <10 mg/dL (ref <10)

## 2020-09-10 MED ORDER — CEPHALEXIN 500 MG PO CAPS: 500.0000 mg | ORAL_CAPSULE | Freq: Three times a day (TID) | ORAL | 0 refills | Status: DC

## 2020-09-10 MED ORDER — SODIUM CHLORIDE 0.9 % IV SOLN
1.0000 g | Freq: Once | INTRAVENOUS | Status: AC
  Administered 2020-09-10: 1 g via INTRAVENOUS
  Filled 2020-09-10: qty 10

## 2020-09-10 MED ORDER — SODIUM CHLORIDE 0.9 % IV BOLUS
1000.0000 mL | Freq: Once | INTRAVENOUS | Status: AC
  Administered 2020-09-10: 1000 mL via INTRAVENOUS

## 2020-09-10 NOTE — ED Notes (Signed)
Pt using phone in room to call mother

## 2020-09-10 NOTE — ED Notes (Signed)
Pt given sprite 
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
## 2020-09-10 NOTE — Discharge Instructions (Addendum)
Your white blood cell count is elevated likely from drug use and also tract infection.  Please take Keflex 3 times daily for a week  Please follow-up with your doctor and stop using drugs.  See resource guide   Return to ER if you have overdose, fever, vomiting

## 2020-09-10 NOTE — ED Notes (Signed)
Mother Joni Colegrove [phone] would like an update
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
## 2020-09-24 ENCOUNTER — Emergency Department (HOSPITAL_COMMUNITY)
Admission: EM | Admit: 2020-09-24 | Discharge: 2020-09-24 | Disposition: A | Discharge status: Home / Self Care | Payer: Medicaid Other | Attending: Emergency Medicine | Admitting: Emergency Medicine

## 2020-09-24 ENCOUNTER — Encounter (HOSPITAL_COMMUNITY): Payer: Self-pay

## 2020-09-24 ENCOUNTER — Other Ambulatory Visit: Payer: Self-pay

## 2020-09-24 DIAGNOSIS — T50904A Poisoning by unspecified drugs, medicaments and biological substances, undetermined, initial encounter: Secondary | ICD-10-CM

## 2020-09-24 DIAGNOSIS — E039 Hypothyroidism, unspecified: Secondary | ICD-10-CM | POA: Insufficient documentation

## 2020-09-24 DIAGNOSIS — F17210 Nicotine dependence, cigarettes, uncomplicated: Secondary | ICD-10-CM | POA: Diagnosis not present

## 2020-09-24 DIAGNOSIS — T40601A Poisoning by unspecified narcotics, accidental (unintentional), initial encounter: Secondary | ICD-10-CM | POA: Diagnosis not present

## 2020-09-24 MED ORDER — ONDANSETRON HCL 4 MG/2ML IJ SOLN
4.0000 mg | Freq: Once | INTRAMUSCULAR | Status: AC
  Administered 2020-09-24: 4 mg via INTRAVENOUS

## 2020-09-24 MED ORDER — NALOXONE HCL 4 MG/0.1ML NA LIQD
1.0000 | Freq: Once | NASAL | Status: AC
  Administered 2020-09-24: 1 via NASAL

## 2020-09-24 NOTE — ED Notes (Addendum)
Pt given 4mg  Nasal narcan @ 1420 and 4mg  IV narcan @ 1422.
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
## 2020-09-24 NOTE — ED Notes (Signed)
Pt verbalized her ride was here and she wanted to leave AMA. IV removed and AMA signed. Dr aware.
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
## 2020-09-24 NOTE — ED Notes (Signed)
Nurse spoke with pt. Pt agreed to stay for just a little while before leaving. Pt called her mother to come and pick her up. Pt is alert, talking answering questions appropriately. Pt verbalized she did not shoot up any drugs, she only snorted cocaine.

## 2020-12-14 ENCOUNTER — Other Ambulatory Visit (HOSPITAL_BASED_OUTPATIENT_CLINIC_OR_DEPARTMENT_OTHER): Payer: Self-pay

## 2020-12-14 MED ORDER — AMPHETAMINE-DEXTROAMPHETAMINE 30 MG PO TABS
ORAL_TABLET | ORAL | 0 refills | Status: DC
  Filled 2020-12-14: qty 60, 30d supply, fill #0

## 2021-01-27 ENCOUNTER — Other Ambulatory Visit (HOSPITAL_BASED_OUTPATIENT_CLINIC_OR_DEPARTMENT_OTHER): Payer: Self-pay

## 2021-02-08 ENCOUNTER — Other Ambulatory Visit (HOSPITAL_BASED_OUTPATIENT_CLINIC_OR_DEPARTMENT_OTHER): Payer: Self-pay

## 2021-02-08 MED ORDER — AMPHETAMINE-DEXTROAMPHETAMINE 30 MG PO TABS: ORAL_TABLET | ORAL | 0 refills | Status: DC

## 2021-02-09 ENCOUNTER — Other Ambulatory Visit (HOSPITAL_BASED_OUTPATIENT_CLINIC_OR_DEPARTMENT_OTHER): Payer: Self-pay

## 2022-01-19 ENCOUNTER — Other Ambulatory Visit (HOSPITAL_BASED_OUTPATIENT_CLINIC_OR_DEPARTMENT_OTHER): Payer: Self-pay

## 2022-01-19 MED ORDER — AMPHETAMINE-DEXTROAMPHETAMINE 30 MG PO TABS
30.0000 mg | ORAL_TABLET | Freq: Two times a day (BID) | ORAL | 0 refills | Status: DC
  Filled 2022-01-19: qty 60, 30d supply, fill #0

## 2022-02-13 ENCOUNTER — Other Ambulatory Visit: Payer: Self-pay | Admitting: Nurse Practitioner

## 2022-02-13 DIAGNOSIS — B182 Chronic viral hepatitis C: Secondary | ICD-10-CM

## 2022-02-17 ENCOUNTER — Other Ambulatory Visit (HOSPITAL_BASED_OUTPATIENT_CLINIC_OR_DEPARTMENT_OTHER): Payer: Self-pay

## 2022-02-17 MED ORDER — AMPHETAMINE-DEXTROAMPHETAMINE 30 MG PO TABS
30.0000 mg | ORAL_TABLET | Freq: Two times a day (BID) | ORAL | 0 refills | Status: DC
  Filled 2022-02-20: qty 60, 30d supply, fill #0

## 2022-02-20 ENCOUNTER — Other Ambulatory Visit (HOSPITAL_BASED_OUTPATIENT_CLINIC_OR_DEPARTMENT_OTHER): Payer: Self-pay

## 2022-03-06 ENCOUNTER — Emergency Department (HOSPITAL_COMMUNITY)
Admission: EM | Admit: 2022-03-06 | Discharge: 2022-03-09 | Disposition: A | Discharge status: Other Acute Inpatient Hospital | Payer: Medicaid Other | Attending: Emergency Medicine | Admitting: Emergency Medicine

## 2022-03-06 DIAGNOSIS — F22 Delusional disorders: Secondary | ICD-10-CM | POA: Insufficient documentation

## 2022-03-06 DIAGNOSIS — F17210 Nicotine dependence, cigarettes, uncomplicated: Secondary | ICD-10-CM | POA: Diagnosis not present

## 2022-03-06 DIAGNOSIS — F29 Unspecified psychosis not due to a substance or known physiological condition: Secondary | ICD-10-CM | POA: Diagnosis not present

## 2022-03-06 DIAGNOSIS — F1910 Other psychoactive substance abuse, uncomplicated: Secondary | ICD-10-CM | POA: Diagnosis present

## 2022-03-06 DIAGNOSIS — F1210 Cannabis abuse, uncomplicated: Secondary | ICD-10-CM | POA: Insufficient documentation

## 2022-03-06 DIAGNOSIS — F23 Brief psychotic disorder: Secondary | ICD-10-CM | POA: Diagnosis present

## 2022-03-06 DIAGNOSIS — R4781 Slurred speech: Secondary | ICD-10-CM | POA: Diagnosis not present

## 2022-03-06 DIAGNOSIS — F1510 Other stimulant abuse, uncomplicated: Secondary | ICD-10-CM | POA: Insufficient documentation

## 2022-03-06 DIAGNOSIS — F1310 Sedative, hypnotic or anxiolytic abuse, uncomplicated: Secondary | ICD-10-CM | POA: Diagnosis not present

## 2022-03-06 DIAGNOSIS — F1410 Cocaine abuse, uncomplicated: Secondary | ICD-10-CM | POA: Insufficient documentation

## 2022-03-06 DIAGNOSIS — Z20822 Contact with and (suspected) exposure to covid-19: Secondary | ICD-10-CM | POA: Diagnosis not present

## 2022-03-06 DIAGNOSIS — F419 Anxiety disorder, unspecified: Secondary | ICD-10-CM | POA: Insufficient documentation

## 2022-03-06 LAB — COMPREHENSIVE METABOLIC PANEL
ALT: 74 U/L — ABNORMAL HIGH (ref 0–44)
AST: 89 U/L — ABNORMAL HIGH (ref 15–41)
Albumin: 3.6 g/dL (ref 3.5–5.0)
Alkaline Phosphatase: 70 U/L (ref 38–126)
Anion gap: 11 (ref 5–15)
BUN: 17 mg/dL (ref 6–20)
CO2: 25 mmol/L (ref 22–32)
Calcium: 9 mg/dL (ref 8.9–10.3)
Chloride: 104 mmol/L (ref 98–111)
Creatinine, Ser: 0.93 mg/dL (ref 0.44–1.00)
GFR, Estimated: >60 mL/min (ref >60)
Glucose, Bld: 74 mg/dL (ref 70–99)
Potassium: 4.5 mmol/L (ref 3.5–5.1)
Sodium: 140 mmol/L (ref 135–145)
Total Bilirubin: 0.7 mg/dL (ref 0.3–1.2)
Total Protein: 7.1 g/dL (ref 6.5–8.1)

## 2022-03-06 LAB — CBC WITH DIFFERENTIAL/PLATELET
Abs Immature Granulocytes: 0.03 10*3/uL (ref 0.00–0.07)
Basophils Absolute: 0.1 10*3/uL (ref 0.0–0.1)
Basophils Relative: 1 %
Eosinophils Absolute: 0 10*3/uL (ref 0.0–0.5)
Eosinophils Relative: 0 %
HCT: 36.4 % (ref 36.0–46.0)
Hemoglobin: 11.8 g/dL — ABNORMAL LOW (ref 12.0–15.0)
Immature Granulocytes: 0 %
Lymphocytes Relative: 14 %
Lymphs Abs: 1.6 10*3/uL (ref 0.7–4.0)
MCH: 29.3 pg (ref 26.0–34.0)
MCHC: 32.4 g/dL (ref 30.0–36.0)
MCV: 90.3 fL (ref 80.0–100.0)
Monocytes Absolute: 0.5 10*3/uL (ref 0.1–1.0)
Monocytes Relative: 4 %
Neutro Abs: 9.1 10*3/uL — ABNORMAL HIGH (ref 1.7–7.7)
Neutrophils Relative %: 81 %
Platelets: 298 10*3/uL (ref 150–400)
RBC: 4.03 MIL/uL (ref 3.87–5.11)
RDW: 12.1 % (ref 11.5–15.5)
WBC: 11.3 10*3/uL — ABNORMAL HIGH (ref 4.0–10.5)
nRBC: 0 % (ref 0.0–0.2)

## 2022-03-06 LAB — RAPID URINE DRUG SCREEN, HOSP PERFORMED
Amphetamines: POSITIVE — AB
Barbiturates: NOT DETECTED
Benzodiazepines: POSITIVE — AB
Cocaine: POSITIVE — AB
Opiates: NOT DETECTED
Tetrahydrocannabinol: POSITIVE — AB

## 2022-03-06 LAB — PREGNANCY, URINE: Preg Test, Ur: NEGATIVE

## 2022-03-06 LAB — RESP PANEL BY RT-PCR (RSV, FLU A&B, COVID)  RVPGX2
Influenza A by PCR: NEGATIVE
Influenza B by PCR: NEGATIVE
Resp Syncytial Virus by PCR: NEGATIVE
SARS Coronavirus 2 by RT PCR: NEGATIVE

## 2022-03-06 MED ORDER — RISPERIDONE 0.5 MG PO TBDP
2.0000 mg | ORAL_TABLET | Freq: Three times a day (TID) | ORAL | Status: DC | PRN
  Administered 2022-03-06 – 2022-03-07 (×2): 2 mg via ORAL
  Filled 2022-03-06 (×2): qty 4

## 2022-03-06 MED ORDER — ZIPRASIDONE MESYLATE 20 MG IM SOLR
10.0000 mg | INTRAMUSCULAR | Status: AC | PRN
  Administered 2022-03-06: 10 mg via INTRAMUSCULAR
  Filled 2022-03-06: qty 20

## 2022-03-06 MED ORDER — STERILE WATER FOR INJECTION IJ SOLN
INTRAMUSCULAR | Status: AC
  Filled 2022-03-06: qty 10

## 2022-03-06 MED ORDER — STERILE WATER FOR INJECTION IJ SOLN
INTRAMUSCULAR | Status: AC
  Administered 2022-03-06: 10 mL
  Filled 2022-03-06: qty 10

## 2022-03-06 MED ORDER — LORAZEPAM 1 MG PO TABS
1.0000 mg | ORAL_TABLET | ORAL | Status: AC | PRN
  Administered 2022-03-06: 1 mg via ORAL
  Filled 2022-03-06: qty 1

## 2022-03-06 MED ORDER — OLANZAPINE 5 MG PO TBDP
5.0000 mg | ORAL_TABLET | Freq: Every day | ORAL | Status: DC
  Administered 2022-03-06: 5 mg via ORAL
  Filled 2022-03-06: qty 1

## 2022-03-06 NOTE — ED Provider Notes (Signed)
[facility] Provider Note   CSN: [id number] Arrival date & time: 03/06/22  1211     History  Chief Complaint  Patient presents with   IVC    HAGAR SADIQ is a 42 y.o. female.  42 year old female presents under IVC via Event organiser.  Patient has history of schizophrenia as well as substance abuse.  Patient had bizarre behavior today.  According to the IVC paperwork, patient expressed suicidal ideations with plan for send.  Patient herself denies any use of illicit drugs.  States that she is not suicidal.  Notes compliance with her mental health medications.  Patient was seen by the behavioral health response team and felt a danger to self and was placed under IVC by them       Home Medications Prior to Admission medications   Medication Sig Start Date End Date Taking? Authorizing Provider  amphetamine-dextroamphetamine (ADDERALL) 30 MG tablet Take 1 tablet by mouth 2 (two) times daily. 05/09/19   [provider]  amphetamine-dextroamphetamine (ADDERALL) 30 MG tablet Take 1 tablet by mouth twice daily. VIOLA OR DAVID Mimnaugh TO PICK UP ONLY 01/19/22     amphetamine-dextroamphetamine (ADDERALL) 30 MG tablet Take 1 tablet by mouth twice daily, VIOLA OR DAVID Rohner TO PICK UP ONLY(fill 02-18-22) 02/18/22     benztropine (COGENTIN) 0.5 MG tablet Take 1 tablet (0.5 mg total) by mouth daily. 06/16/18   Virgel Manifold, MD  benztropine (COGENTIN) 1 MG tablet Take 1 mg by mouth daily. 09/08/20   [provider]  cephALEXin (KEFLEX) 500 MG capsule Take 1 capsule (500 mg total) by mouth 3 (three) times daily. 09/10/20   Drenda Freeze, MD  diazepam (VALIUM) 5 MG tablet Take 5 mg by mouth 3 (three) times daily as needed for anxiety. 05/09/19   [provider]  haloperidol (HALDOL) 10 MG tablet Take 1 tablet (10 mg total) by mouth at bedtime. 06/16/18   Virgel Manifold, MD  haloperidol (HALDOL) 5 MG tablet Take by mouth. 08/12/20    [provider]      Allergies    Cymbalta [duloxetine hcl], Lithium, Quetiapine, and Seroquel [quetiapine fumarate]    Review of Systems   Review of Systems  All other systems reviewed and are negative.   Physical Exam Updated Vital Signs BP 120/75   Pulse 95   Temp 98.4 F (36.9 C) (Oral)   Resp 20   SpO2 98%  Physical Exam Vitals and nursing note reviewed.  Constitutional:      General: She is not in acute distress.    Appearance: Normal appearance. She is well-developed. She is not toxic-appearing.  HENT:     Head: Normocephalic and atraumatic.  Eyes:     General: Lids are normal.     Conjunctiva/sclera: Conjunctivae normal.     Pupils: Pupils are equal, round, and reactive to light.  Neck:     Thyroid: No thyroid mass.     Trachea: No tracheal deviation.  Cardiovascular:     Rate and Rhythm: Normal rate and regular rhythm.     Heart sounds: Normal heart sounds. No murmur heard.    No gallop.  Pulmonary:     Effort: Pulmonary effort is normal. No respiratory distress.     Breath sounds: Normal breath sounds. No stridor. No decreased breath sounds, wheezing, rhonchi or rales.  Abdominal:     General: There is no distension.     Palpations: Abdomen is soft.  Tenderness: There is no abdominal tenderness. There is no rebound.  Musculoskeletal:        General: No tenderness. Normal range of motion.     Cervical back: Normal range of motion and neck supple.  Skin:    General: Skin is warm and dry.     Findings: No abrasion or rash.  Neurological:     Mental Status: She is alert and oriented to person, place, and time. Mental status is at baseline.     GCS: GCS eye subscore is 4. GCS verbal subscore is 5. GCS motor subscore is 6.     Cranial Nerves: No cranial nerve deficit.     Sensory: No sensory deficit.     Motor: Motor function is intact.  Psychiatric:        Attention and Perception: Attention normal.        Mood and Affect: Affect is labile.         Speech: Speech normal.        Behavior: Behavior is withdrawn.        Thought Content: Thought content does not include homicidal or suicidal ideation.     ED Results / Procedures / Treatments   Labs (all labs ordered are listed, but only abnormal results are displayed) Labs Reviewed  CBC WITH DIFFERENTIAL/PLATELET  COMPREHENSIVE METABOLIC PANEL  RAPID URINE DRUG SCREEN, HOSP PERFORMED  I-STAT BETA HCG BLOOD, ED ([facility], [facility], [facility] ONLY)  POC URINE PREG, ED    EKG None  Radiology No results found.  Procedures Procedures    Medications Ordered in ED Medications - No data to display  ED Course/ Medical Decision Making/ A&P                             Medical Decision Making Amount and/or Complexity of Data Reviewed Labs: ordered.   First exam filled out here.  Patient have screening labs and will be medically cleared for psychiatric disposition        Final Clinical Impression(s) / ED Diagnoses Final diagnoses:  None    Rx / DC Orders ED Discharge Orders     None         [name], MD 03/06/22 1306

## 2022-03-06 NOTE — ED Provider Notes (Signed)
Provider attempted to assess patient, patient asleep, patient has been given Geodon 10 mg IM for agitation. Will reassess once patient is up and alert.  Patient was IVCd due to acting erratic, has a history of schizophrenia noncompliant with medications she was brought in by [employer].

## 2022-03-06 NOTE — ED Triage Notes (Signed)
IVC acting erratic hx schizophrenia non compliant meds brought in by [employer]

## 2022-03-07 MED ORDER — LORAZEPAM 1 MG PO TABS
1.0000 mg | ORAL_TABLET | Freq: Three times a day (TID) | ORAL | Status: DC | PRN
  Administered 2022-03-07 – 2022-03-08 (×2): 1 mg via ORAL
  Filled 2022-03-07 (×2): qty 1

## 2022-03-07 MED ORDER — ZIPRASIDONE MESYLATE 20 MG IM SOLR
10.0000 mg | Freq: Once | INTRAMUSCULAR | Status: AC
  Administered 2022-03-07: 10 mg via INTRAMUSCULAR
  Filled 2022-03-07: qty 20

## 2022-03-07 MED ORDER — LORAZEPAM 1 MG PO TABS
1.0000 mg | ORAL_TABLET | Freq: Once | ORAL | Status: AC
  Administered 2022-03-07: 1 mg via ORAL
  Filled 2022-03-07: qty 1

## 2022-03-07 MED ORDER — STERILE WATER FOR INJECTION IJ SOLN
INTRAMUSCULAR | Status: AC
  Administered 2022-03-07: 10 mL
  Filled 2022-03-07: qty 10

## 2022-03-07 MED ORDER — OLANZAPINE 10 MG IM SOLR
5.0000 mg | Freq: Once | INTRAMUSCULAR | Status: AC
  Administered 2022-03-07: 5 mg via INTRAMUSCULAR
  Filled 2022-03-07: qty 10

## 2022-03-07 MED ORDER — OLANZAPINE 5 MG PO TBDP
7.5000 mg | ORAL_TABLET | Freq: Every day | ORAL | Status: DC
  Administered 2022-03-07 – 2022-03-08 (×2): 7.5 mg via ORAL
  Filled 2022-03-07 (×2): qty 2

## 2022-03-07 NOTE — ED Notes (Signed)
Corallo verbally consented to allow me to discuss her illness with mother. Pt had fair phone call with mother speech remains slured  which mother states is new for her and her speech is normally clear. Mother was informed that the length of Freet's stay her will be determine by how quickly a treatment facility is found.
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
## 2022-03-07 NOTE — ED Notes (Signed)
Pt barely able to stand on her own, constantly getting up and trying to walk out of the room, danger to self for fall risk. Will sit down for a minute and close her eyes then gets up and tries to walk, no longer able to redirect patient.

## 2022-03-07 NOTE — ED Notes (Signed)
Pt taking her clothes off and redirected to put them back on.

## 2022-03-07 NOTE — Consult Note (Cosign Needed Addendum)
Unable to evaluate patient at this time.  She is agitated, confused with word salad.  Cannot engage in any meaning conversation.  Ativan 1 mg prescribed q 8 hrs as needed.  Will attempt evaluation later.  1751:  Patient does not seem to respond to Medications after 2 MG of Ativan orally and 5 mg Injection Olanzapine.  She is still awake, screaming and occasionally uses word salad saying words that are difficult to understand.  Patient has been on four point restraints for safety to prevent injuring self and staff.  We will continue to monitor patient.  We will try evaluation once she is calm and cooperative.

## 2022-03-07 NOTE — ED Notes (Signed)
Pt. keep coming out of her room but she is redirected to go back to her room by Probation officer.

## 2022-03-07 NOTE — Progress Notes (Signed)
Leslie [name] unplugged the call bell and tried to plug  any type object into any electrical outlet in the room trying to make a call. The objects were not safe for the Leslie [name]. Leslie [name] stood up in one of the 3 chaire in her room looking for an outlet. Writer took all three chairs out of the room.

## 2022-03-07 NOTE — ED Notes (Signed)
Pt informed that none of the pt rooms have phones and that phone time is over until the morning she then pulled the nurse call cord from the wall and began to attempt to plug it into the electrical wall socket . Staff asked her to discontinue doing this but she refused and then began attempting to insert what ever she could find on the floor into the wall socket. Repeated warning where given that this behavior is very dangerous and that if she continued that she would be placed in soft restraints. After a few words of profanity she stated " I  will do what I want " and began attempting to tamper with the wall sockets. Soft wrist and ankle restraints were applied per protocal  and restraint order was obtained

## 2022-03-07 NOTE — ED Notes (Signed)
Pt released to go to bathroom, discussed with pt to leave restraints off and expectations to keep them off, pt was able to verbalize back to this nurse expectations of leaving restraints off. Water offered to pt.

## 2022-03-07 NOTE — ED Notes (Signed)
Pt removing pieces of the bed frame, attempted to redirect pt, pt bed frame removed from room and mattress placed on floor for patient safety.

## 2022-03-07 NOTE — ED Notes (Signed)
Pt repeatedly coming to door talking incoherently and seeing people that are not there. Provider aware.

## 2022-03-07 NOTE — ED Notes (Signed)
Pt remains out of restraints requires continous support and redirection as need. Speech is slurried intermittant word salad like speech patterns noted. No aggressive behaviors or further high risk taking behavior noted.

## 2022-03-07 NOTE — ED Notes (Signed)
Pt agitated and walking around room, able to be redirected to sit in the bed. Given water.

## 2022-03-07 NOTE — ED Notes (Signed)
Pt coming to door intermittently talking and unable to understand what she is saying. Currently able to redirect pt back to her room.
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
## 2022-03-07 NOTE — ED Provider Notes (Signed)
Emergency Medicine Observation Re-evaluation Note  Leslie [name] is a 42 y.o. female, seen on rounds today.  Pt initially presented to the ED for complaints of IVC Currently, the patient is agitated very upset.  Physical Exam  BP 131/84 (BP Location: Right Arm)   Pulse (!) 107   Temp 98.3 F (36.8 C) (Oral)   Resp 20   SpO2 96%  Physical Exam  ED Course / MDM  EKG:   I have reviewed the labs performed to date as well as medications administered while in observation.  Recent changes in the last 24 hours include as below.  Plan  Current plan is for patient to be seen by psychiatry.  Patient was medicated with Geodon yesterday due to her behavior.[name], MD 03/07/22 0930

## 2022-03-07 NOTE — ED Notes (Signed)
Pt jewelry removed and placed with her belongings in locker.

## 2022-03-08 ENCOUNTER — Other Ambulatory Visit: Payer: Self-pay

## 2022-03-08 ENCOUNTER — Encounter (HOSPITAL_COMMUNITY): Payer: Self-pay | Admitting: Emergency Medicine

## 2022-03-08 DIAGNOSIS — F1910 Other psychoactive substance abuse, uncomplicated: Secondary | ICD-10-CM | POA: Diagnosis not present

## 2022-03-08 DIAGNOSIS — F23 Brief psychotic disorder: Secondary | ICD-10-CM | POA: Diagnosis present

## 2022-03-08 MED ORDER — DIPHENHYDRAMINE HCL 50 MG/ML IJ SOLN
25.0000 mg | Freq: Once | INTRAMUSCULAR | Status: DC
  Filled 2022-03-08: qty 1

## 2022-03-08 MED ORDER — NICOTINE 21 MG/24HR TD PT24
21.0000 mg | MEDICATED_PATCH | Freq: Every day | TRANSDERMAL | Status: DC
  Administered 2022-03-08 – 2022-03-09 (×2): 21 mg via TRANSDERMAL
  Filled 2022-03-08 (×2): qty 1

## 2022-03-08 MED ORDER — DIPHENHYDRAMINE HCL 50 MG/ML IJ SOLN
50.0000 mg | Freq: Once | INTRAMUSCULAR | Status: AC
  Administered 2022-03-08: 50 mg via INTRAMUSCULAR

## 2022-03-08 MED ORDER — ACETAMINOPHEN 325 MG PO TABS: 650.0000 mg | ORAL_TABLET | ORAL | Status: DC | PRN

## 2022-03-08 MED ORDER — LORAZEPAM 2 MG/ML IJ SOLN
2.0000 mg | Freq: Once | INTRAMUSCULAR | Status: AC
  Administered 2022-03-08: 2 mg via INTRAMUSCULAR
  Filled 2022-03-08: qty 1

## 2022-03-08 MED ORDER — DIPHENHYDRAMINE HCL 50 MG/ML IJ SOLN
25.0000 mg | Freq: Once | INTRAMUSCULAR | Status: AC
  Administered 2022-03-08: 25 mg via INTRAMUSCULAR
  Filled 2022-03-08: qty 1

## 2022-03-08 NOTE — ED Notes (Signed)
Pt still coming out of her room staff has to redirected her to stay in her room

## 2022-03-08 NOTE — ED Notes (Addendum)
Since pt was transferred into [facility], pt has been pushing against staff and [employer], requiring repeat redirection without success. Pt screaming and attempted to to open [facility] doors. Pt pulling staff emergency button.

## 2022-03-08 NOTE — ED Provider Notes (Signed)
I was informed by charge RN that patient verbally and physically aggressive with staff, escalating in her behavior, pushing on the doors, requiring [employer] restraint.  I was contacted for request for medications.  Patient with prolonged QTc as well as reported escalation in her behavior after Geodon yesterday.  Will proceed with IM Benadryl and Ativan at this time.  This chart was dictated using voice recognition software, Dragon. Despite the best efforts of this provider to proofread and correct errors, errors may still occur which can change documentation meaning.    Leslie [name] 03/08/22 0126    Shanon Rosser, MD 03/08/22 [phone]

## 2022-03-08 NOTE — ED Notes (Signed)
Pt put her bed blankets and her pillow in the commode.
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
## 2022-03-08 NOTE — ED Notes (Signed)
Patient continues to be screaming and yelling.

## 2022-03-08 NOTE — ED Notes (Signed)
Mrs. Devonshire remains very animated and creative such as unsuccessfully turning a pair of paper scrub pants into short,, attempting to dismantle her bed, coming out of her room and attempting to elope. So far per staff report she has slept only a couple hours in the last 48 hrs. She appears fatigued but refuses to lye down and rest or sleep. Here affect is  blunted her mood is labile and very animated her behavior requires much redirection. Stopped from flushing her bed linens down her toilet then stopping her from attempting to flush a large amount of paper towels down her toilet. She is frequently removing all of her clothes and has used 3 set of paper scrubs since being released from four point soft restraint at the begining of this shift.

## 2022-03-08 NOTE — Consult Note (Cosign Needed Addendum)
1645 Soft restraints taken off bilateral wrist and ankles, by nursing staff, due to patient calm behavior, per RN patient was listening and following directions. Face to Face done by provider at 1650, restraints were taken off bilateral wrists, and legs. Patient in no acute distress. Denies SI/HI/AVH, says she wants to go home, pateint is tearful, and laying down, apologizing to provider for being loud, and aggressive behavior. Benadryl 25 mg IM appears to be effective at this time.  Provider assessed the area where the restraints were located and no bruising, redness, or swelling noted.    Patient alert and oriented x 4, no acute psychosis or mania at his time. Her mood is euthymic with congruent affect. She has normal speech, and behavior.  Objectively there is no evidence of psychosis/mania or delusional thinking.  Patient is able to converse coherently, no distractibility, or pre-occupation.  She denies suicidal/self-harm/homicidal ideation, psychosis, and paranoia.  Patient answered question appropriately.   TTS will continue to follow and patient continues to meet inpatient criteria and has been accepted to [facility] on 03/09/2022. Patient has a Prolonged QT Abnormal ECG, as result will continue to limit IM medications (especially antipsychotics) in this acutely agitated patient.  Patient with multiple risk factors for QTc prolongation to include cardiac abnormalities, female, age >73, 3 Psychotropics that may prolong qtc, and electrolyte abnormalities.
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
## 2022-03-08 NOTE — ED Notes (Addendum)
Pt medicated for anxiety prior to arrival and became an increased fall risk that is noncompliant with staff and displayed behavior that showed no regard for safety so in consideration for the pt's safety she was returned to TCU where she could be restrained if necessary.
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
## 2022-03-08 NOTE — ED Notes (Signed)
Patient resting comfortably breathing regular and unlabored.

## 2022-03-08 NOTE — Progress Notes (Signed)
LCSW Progress Note  [id number]   SHALECE STAFFA  03/08/2022  11:28 AM  Description:   Inpatient Psychiatric Referral  Patient was recommended inpatient per [facility], PMHNP. There are no available beds at [facility]. Patient was referred to the following facilities:   Destination  Service Provider Address Phone Fax Patient Preferred  [place] [place] [address] [phone] --  [place]  [place], [place] [address] [phone] --  [facility]  [address] [place], [place] [address] [place] --  [place]  [address], [place] [address] [phone] --  [facility]  [place], [place] [address] [phone] --  [facility] [place] [address] [phone] --  [facility]  [address] [place] [address] [place] --  [facility]  [address] [place], [place] [address] [phone] --  [place] [address] [place] [address] [phone] --  [facility]  [address] [place] [address] [phone] --  [facility]  [address], [place] [address] [phone] --  [facility]  [address] [place] [address] [phone] --  [facility]  [address] [place] [address] [phone] --  [facility]  [address], [place] [address] [phone] --  [facility]  [address], [place] [address] [phone] --   [facility]  [address], [place] [address] [phone] --  [facility]  [place], [place] [address] [phone] --  [facility]  [address] [place] [address] [phone] --  [place] [place] [address] [phone] --  [facility]  [address] [place] [address] [phone] --  [place] [address], [place] [address] [phone] --  [facility]  [address] [place] [address] [phone] --  [facility]  [address], [place] [address] [phone] --  [facility]  [address], [place] [address] [phone] --  [place], [place] [address] [phone] --  [facility]  [address] [place] [address] [phone] --      Situation ongoing, CSW to continue following and update chart as more information becomes available.      Denna Haggard, [place]  03/08/2022 11:28 AM

## 2022-03-08 NOTE — ED Notes (Addendum)
Pt removed thermostat apparatus off wall in [facility] hallway, staff removed broken apparatus from [facility], facilities notified.
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
## 2022-03-08 NOTE — ED Notes (Signed)
Patient became agitated.  Yelling. Trying to leave unit. Patient had to be redirected to room. Patient resistive and uncooperative with directions or redirections.  Patient unsafe to self and others.  NP aware and orders places (see MAR and flowsheet).

## 2022-03-08 NOTE — ED Notes (Signed)
[employer] [employer] assisted staff with securing pt for medication administration, [employer] Officer present during encounter.

## 2022-03-08 NOTE — Progress Notes (Signed)
Pt was accepted to [facility] 03/09/2022. Bed assignment: Main campus  Pt meets inpatient criteria per Michaele Offer, [place]  Attending Physician will be Leta Jungling, MD  Report can be called to: [phone] (this is a pager, please leave call-back number when giving report)  Pt can arrive after 8 AM  Care Team Notified: Michaele Offer, PMHNP and Wilmon Arms, RN  [place], [place]  03/08/2022 11:42 AM

## 2022-03-09 NOTE — ED Provider Notes (Addendum)
Emergency Medicine Observation Re-evaluation Note  Leslie [name] is a 42 y.o. female with a history of schizophrenia and substance abuse, seen on rounds today.  Pt initially presented to the ED for complaints of IVC who presented with suicidal ideation and bizarre behavior Currently, the patient is resting comfortably.  Physical Exam  BP (!) 140/87 (BP Location: Left Arm)   Pulse (!) 101   Temp (!) 97.5 F (36.4 C) (Oral)   Resp 20   SpO2 100%  Physical Exam General: Resting comfortably in stretcher calm Lungs: Normal work of breathing Psych: Calm and cooperative  ED Course / MDM  EKG:EKG Interpretation  Date/Time:  Tuesday March 07 2022 20:48:10 EST Ventricular Rate:  115 PR Interval:  138 QRS Duration: 88 QT Interval:  376 QTC Calculation: 520 R Axis:   73 Text Interpretation: Sinus tachycardia Nonspecific ST and T wave abnormality Prolonged QT Abnormal ECG When compared with ECG of 24-Sep-2020 14:41, PREVIOUS ECG IS PRESENT Confirmed by Regan Lemming ([id number]) on 03/07/2022 9:38:24 PM  I have reviewed the labs performed to date as well as medications administered while in observation.  Recent changes in the last 24 hours include calm after being agitated yesterday requiring intramuscular medications and restraints temporarily.  Plan  Current plan is for placement. Please note that if the patient needs medication she does have prolonged QTc      Fransico Meadow, MD 03/09/22 [phone]

## 2022-03-09 NOTE — ED Notes (Signed)
RN called [phone] and left call back number so report could be called in, waiting call back

## 2022-03-09 NOTE — ED Notes (Signed)
Pt is currently asleep but has intermitant episode of waking , coming to the door of her room and ask questions about being discharge then returns to bed and sleep.

## 2022-03-09 NOTE — ED Notes (Signed)
RN called [phone] and left call back number so report could be called in, waiting call back   

## 2022-03-09 NOTE — ED Notes (Signed)
Kerrie called from [facility] and received report to accept patient

## 2022-03-09 NOTE — ED Notes (Signed)
Called for transport

## 2022-03-09 NOTE — ED Provider Notes (Signed)
Error on IVC paperwork filled out by [employer] on arrival.  Repeat IVC paperwork has been completed and given to Network engineer.   Fransico Meadow, MD 03/09/22 1350

## 2022-03-17 ENCOUNTER — Other Ambulatory Visit: Payer: Self-pay

## 2022-03-17 ENCOUNTER — Emergency Department (HOSPITAL_COMMUNITY)
Admission: EM | Admit: 2022-03-17 | Discharge: 2022-03-17 | Disposition: A | Discharge status: Home / Self Care | Payer: Medicaid Other | Attending: Emergency Medicine | Admitting: Emergency Medicine

## 2022-03-17 ENCOUNTER — Encounter (HOSPITAL_COMMUNITY): Payer: Self-pay

## 2022-03-17 DIAGNOSIS — T428X5A Adverse effect of antiparkinsonism drugs and other central muscle-tone depressants, initial encounter: Secondary | ICD-10-CM | POA: Insufficient documentation

## 2022-03-17 DIAGNOSIS — R258 Other abnormal involuntary movements: Secondary | ICD-10-CM | POA: Insufficient documentation

## 2022-03-17 DIAGNOSIS — T889XXA Complication of surgical and medical care, unspecified, initial encounter: Secondary | ICD-10-CM

## 2022-03-17 LAB — CBC WITH DIFFERENTIAL/PLATELET
Abs Immature Granulocytes: 0.05 10*3/uL (ref 0.00–0.07)
Basophils Absolute: 0.1 10*3/uL (ref 0.0–0.1)
Basophils Relative: 1 %
Eosinophils Absolute: 0.2 10*3/uL (ref 0.0–0.5)
Eosinophils Relative: 2 %
HCT: 44.5 % (ref 36.0–46.0)
Hemoglobin: 14.8 g/dL (ref 12.0–15.0)
Immature Granulocytes: 0 %
Lymphocytes Relative: 28 %
Lymphs Abs: 3.4 10*3/uL (ref 0.7–4.0)
MCH: 29.7 pg (ref 26.0–34.0)
MCHC: 33.3 g/dL (ref 30.0–36.0)
MCV: 89.4 fL (ref 80.0–100.0)
Monocytes Absolute: 0.9 10*3/uL (ref 0.1–1.0)
Monocytes Relative: 7 %
Neutro Abs: 7.7 10*3/uL (ref 1.7–7.7)
Neutrophils Relative %: 62 %
Platelets: 450 10*3/uL — ABNORMAL HIGH (ref 150–400)
RBC: 4.98 MIL/uL (ref 3.87–5.11)
RDW: 12.6 % (ref 11.5–15.5)
WBC: 12.3 10*3/uL — ABNORMAL HIGH (ref 4.0–10.5)
nRBC: 0 % (ref 0.0–0.2)

## 2022-03-17 LAB — COMPREHENSIVE METABOLIC PANEL
ALT: 35 U/L (ref 0–44)
AST: 27 U/L (ref 15–41)
Albumin: 3.9 g/dL (ref 3.5–5.0)
Alkaline Phosphatase: 80 U/L (ref 38–126)
Anion gap: 12 (ref 5–15)
BUN: 11 mg/dL (ref 6–20)
CO2: 22 mmol/L (ref 22–32)
Calcium: 9.2 mg/dL (ref 8.9–10.3)
Chloride: 104 mmol/L (ref 98–111)
Creatinine, Ser: 0.68 mg/dL (ref 0.44–1.00)
GFR, Estimated: >60 mL/min (ref >60)
Glucose, Bld: 113 mg/dL — ABNORMAL HIGH (ref 70–99)
Potassium: 3.9 mmol/L (ref 3.5–5.1)
Sodium: 138 mmol/L (ref 135–145)
Total Bilirubin: 0.4 mg/dL (ref 0.3–1.2)
Total Protein: 8 g/dL (ref 6.5–8.1)

## 2022-03-17 LAB — RAPID URINE DRUG SCREEN, HOSP PERFORMED
Amphetamines: POSITIVE — AB
Barbiturates: NOT DETECTED
Benzodiazepines: NOT DETECTED
Cocaine: NOT DETECTED
Opiates: NOT DETECTED
Tetrahydrocannabinol: POSITIVE — AB

## 2022-03-17 LAB — ETHANOL: Alcohol, Ethyl (B): <10 mg/dL (ref <10)

## 2022-03-17 NOTE — ED Provider Notes (Signed)
[place] EMERGENCY DEPARTMENT AT [facility] Provider Note   CSN: [id number] Arrival date & time: 03/17/22  0149     History  Chief Complaint  Patient presents with   Medical Clearance    ASLEE EDLIN is a 42 y.o. female.  The history is provided by the patient and medical records.   42 y.o. F with hx of Hep C, ADD, bipolar disorder, depression, substance abuse, presenting to the ED for medical clearance.  States she was at [facility] for the past week and did not receive one of her regularly scheduled medications, amantadine.  States she was given all of her other usual medications, unsure why this one in particular was withheld.  She restarted her medications today but admits that she feels "funny".  She states she feely shaky and very uneasy.  She has not been altered/confused.  She denies any new medications added to her regimen.  Denies drug use.  Home Medications Prior to Admission medications   Medication Sig Start Date End Date Taking? Authorizing Provider  amantadine (SYMMETREL) 100 MG capsule Take 100 mg by mouth 2 (two) times daily.    [provider]  amphetamine-dextroamphetamine (ADDERALL) 30 MG tablet Take 1 tablet by mouth twice daily. VIOLA OR DAVID Verne TO PICK UP ONLY 01/19/22     amphetamine-dextroamphetamine (ADDERALL) 30 MG tablet Take 1 tablet by mouth twice daily, VIOLA OR DAVID Asch TO PICK UP ONLY(fill 02-18-22) 02/18/22     benztropine (COGENTIN) 0.5 MG tablet Take 1 tablet (0.5 mg total) by mouth daily. 06/16/18   Virgel Manifold, MD  benztropine (COGENTIN) 1 MG tablet Take 1 mg by mouth daily. 09/08/20   [provider]  cephALEXin (KEFLEX) 500 MG capsule Take 1 capsule (500 mg total) by mouth 3 (three) times daily. Patient not taking: Reported on 03/08/2022 09/10/20   Drenda Freeze, MD  chlorhexidine (PERIDEX) 0.12 % solution Use as directed in the mouth or throat as directed. 02/15/22   [provider]  diazepam  (VALIUM) 5 MG tablet Take 5 mg by mouth 3 (three) times daily as needed for anxiety. 05/09/19   [provider]  haloperidol (HALDOL) 10 MG tablet Take 1 tablet (10 mg total) by mouth at bedtime. 06/16/18   Virgel Manifold, MD  haloperidol (HALDOL) 5 MG tablet Take 5-10 mg by mouth See admin instructions. Take 10 mg by mouth in the morning and 5 mg at [facility] 08/12/20   [provider]  hydrOXYzine (VISTARIL) 25 MG capsule Take 25 mg by mouth 3 (three) times daily.    [provider]      Allergies    Cymbalta [duloxetine hcl], Seroquel [quetiapine fumarate], and Lithium    Review of Systems   Review of Systems  Constitutional:        Medical clearance  All other systems reviewed and are negative.   Physical Exam Updated Vital Signs BP (!) 130/96   Pulse 100   Temp 98.4 F (36.9 C) (Oral)   Resp 16   Ht 5' 6"$  (1.676 m)   Wt 68 kg   SpO2 98%   BMI 24.20 kg/m   Physical Exam Vitals and nursing note reviewed.  Constitutional:      Appearance: She is well-developed.  HENT:     Head: Normocephalic and atraumatic.  Eyes:     Conjunctiva/sclera: Conjunctivae normal.     Pupils: Pupils are equal, round, and reactive to light.     Comments:  Pupils dilated but symmetric and reactive  Cardiovascular:     Rate and Rhythm: Regular rhythm. Tachycardia present.     Heart sounds: Normal heart sounds.     Comments: Tachy around 110-115 during exam Pulmonary:     Effort: Pulmonary effort is normal.     Breath sounds: Normal breath sounds.  Abdominal:     General: Bowel sounds are normal.     Palpations: Abdomen is soft.  Musculoskeletal:        General: Normal range of motion.     Cervical back: Normal range of motion.  Skin:    General: Skin is warm and dry.  Neurological:     Mental Status: She is alert and oriented to person, place, and time.     Comments: AAOx3, is able to answer questions and follow commands during exam, she is moving her arms/legs without  difficulty, no rigidity, seems tremulous in hands but seems to stop when significant other is holding her hand or when distracted     ED Results / Procedures / Treatments   Labs (all labs ordered are listed, but only abnormal results are displayed) Labs Reviewed  CBC WITH DIFFERENTIAL/PLATELET - Abnormal; Notable for the following components:      Result Value   WBC 12.3 (*)    Platelets 450 (*)    All other components within normal limits  COMPREHENSIVE METABOLIC PANEL - Abnormal; Notable for the following components:   Glucose, Bld 113 (*)    All other components within normal limits  RAPID URINE DRUG SCREEN, HOSP PERFORMED - Abnormal; Notable for the following components:   Amphetamines POSITIVE (*)    Tetrahydrocannabinol POSITIVE (*)    All other components within normal limits  ETHANOL    EKG EKG Interpretation  Date/Time:  Friday March 17 2022 02:45:50 EST Ventricular Rate:  110 PR Interval:  115 QRS Duration: 74 QT Interval:  335 QTC Calculation: 454 R Axis:   71 Text Interpretation: Sinus tachycardia Anteroseptal infarct, old When compared with ECG of EARLIER SAME DATE No significant change was found Confirmed by Delora Fuel ([id number]) on 03/17/2022 2:47:49 AM  Radiology No results found.  Procedures Procedures    Medications Ordered in ED Medications - No data to display  ED Course/ Medical Decision Making/ A&P                             Medical Decision Making Amount and/or Complexity of Data Reviewed Labs: ordered. ECG/medicine tests: ordered and independent interpretation performed.   42 year old female presenting to the ED for evaluation of possible medication side effect.  Has been at [facility] rehab for the past week and for some reason her amantadine was held.  She was given all of her other usual medications.  She took 1 dose of her amantadine today after returning home but states she feels "shaky" and "weird".  She denies any new medications  being added to her regimen.  She denies any drug use today.  She is awake, alert, oriented.  She is answering questions and following commands.  She is moving all of her extremities well does not have any apparent focal deficit.  Does have apparent tremors in the hands, however this resolves when her significant other is holding her hand or when she is distracted.  She is not having any focal seizure activity.  Will check EKG, labs.  3:58 AM Patient reassessed-- she is not longer tremulous.  Remains AAOx3, still able to answer questions and follow commands.  She has been ambulatory to and from restroom.  We have discussed labs which are reassuring.  At this time, possibility of mild amantadine withdrawal, however does not appear to have any severe symptoms at this time.  She has only taken 1 dose of her amantadine since returning home from rehab center.  Ideally any residual symptoms should resolve once back onto her normal regimen.  Recommended close follow-up with prescribing physician.  Can return here for new concerns.  Final Clinical Impression(s) / ED Diagnoses Final diagnoses:  Side effects of treatment, initial encounter    Rx / DC Orders ED Discharge Orders     None         Larene Pickett, PA-C [id number] [phone]    Delora Fuel, MD [id number] [phone]

## 2022-03-17 NOTE — ED Triage Notes (Addendum)
Pt wants to make sure that she is ok after not having her prescribed medications for 1 week while attending a rehab. Pt states that she feels shaky. Pt denies drug use, but is acting strange.

## 2022-03-17 NOTE — Discharge Instructions (Signed)
Continue your medications as prescribed.  Make sure to rest and drink fluids. Please follow-up with your primary care doctor. Return here for new concerns.

## 2022-03-20 ENCOUNTER — Other Ambulatory Visit: Payer: Self-pay | Admitting: Family Medicine

## 2022-03-20 ENCOUNTER — Other Ambulatory Visit (HOSPITAL_BASED_OUTPATIENT_CLINIC_OR_DEPARTMENT_OTHER): Payer: Self-pay

## 2022-03-20 DIAGNOSIS — Z1231 Encounter for screening mammogram for malignant neoplasm of breast: Secondary | ICD-10-CM

## 2022-03-20 MED ORDER — AMPHETAMINE-DEXTROAMPHETAMINE 30 MG PO TABS
30.0000 mg | ORAL_TABLET | ORAL | 0 refills | Status: DC
  Filled 2022-03-22: qty 60, 30d supply, fill #0

## 2022-03-20 MED ORDER — DIAZEPAM 5 MG PO TABS
5.0000 mg | ORAL_TABLET | Freq: Three times a day (TID) | ORAL | 1 refills | Status: DC | PRN
  Filled 2022-03-20: qty 90, 30d supply, fill #0
  Filled 2022-04-19: qty 90, 30d supply, fill #1

## 2022-03-22 ENCOUNTER — Other Ambulatory Visit (HOSPITAL_BASED_OUTPATIENT_CLINIC_OR_DEPARTMENT_OTHER): Payer: Self-pay

## 2022-03-28 ENCOUNTER — Ambulatory Visit
Admission: RE | Admit: 2022-03-28 | Discharge: 2022-03-28 | Disposition: A | Discharge status: Home / Self Care | Payer: Medicaid Other | Source: Ambulatory Visit | Attending: Family Medicine | Admitting: Family Medicine

## 2022-03-28 ENCOUNTER — Ambulatory Visit: Payer: Medicaid Other

## 2022-03-28 DIAGNOSIS — Z1231 Encounter for screening mammogram for malignant neoplasm of breast: Secondary | ICD-10-CM

## 2022-04-03 ENCOUNTER — Other Ambulatory Visit: Payer: Self-pay | Admitting: Family Medicine

## 2022-04-03 DIAGNOSIS — R928 Other abnormal and inconclusive findings on diagnostic imaging of breast: Secondary | ICD-10-CM

## 2022-04-07 ENCOUNTER — Inpatient Hospital Stay: Admission: RE | Admit: 2022-04-07 | Payer: Medicaid Other | Source: Ambulatory Visit

## 2022-04-19 ENCOUNTER — Other Ambulatory Visit (HOSPITAL_BASED_OUTPATIENT_CLINIC_OR_DEPARTMENT_OTHER): Payer: Self-pay

## 2022-04-19 MED ORDER — AMPHETAMINE-DEXTROAMPHETAMINE 30 MG PO TABS
30.0000 mg | ORAL_TABLET | Freq: Two times a day (BID) | ORAL | 0 refills | Status: DC
  Filled 2022-04-21: qty 60, 30d supply, fill #0

## 2022-04-20 ENCOUNTER — Other Ambulatory Visit (HOSPITAL_BASED_OUTPATIENT_CLINIC_OR_DEPARTMENT_OTHER): Payer: Self-pay

## 2022-04-21 ENCOUNTER — Other Ambulatory Visit (HOSPITAL_BASED_OUTPATIENT_CLINIC_OR_DEPARTMENT_OTHER): Payer: Self-pay

## 2022-04-25 ENCOUNTER — Other Ambulatory Visit: Payer: Medicaid Other

## 2022-04-26 ENCOUNTER — Other Ambulatory Visit: Payer: Medicaid Other

## 2022-05-10 ENCOUNTER — Ambulatory Visit: Payer: Medicaid Other

## 2022-05-19 ENCOUNTER — Ambulatory Visit
Admission: RE | Admit: 2022-05-19 | Discharge: 2022-05-19 | Disposition: A | Discharge status: Home / Self Care | Payer: Medicaid Other | Source: Ambulatory Visit | Attending: Nurse Practitioner | Admitting: Nurse Practitioner

## 2022-05-19 DIAGNOSIS — K8020 Calculus of gallbladder without cholecystitis without obstruction: Secondary | ICD-10-CM | POA: Diagnosis not present

## 2022-05-19 DIAGNOSIS — B182 Chronic viral hepatitis C: Secondary | ICD-10-CM | POA: Diagnosis not present

## 2022-05-22 ENCOUNTER — Other Ambulatory Visit (HOSPITAL_BASED_OUTPATIENT_CLINIC_OR_DEPARTMENT_OTHER): Payer: Self-pay

## 2022-05-22 ENCOUNTER — Other Ambulatory Visit: Payer: Medicaid Other

## 2022-05-22 MED ORDER — AMPHETAMINE-DEXTROAMPHETAMINE 30 MG PO TABS
ORAL_TABLET | ORAL | 0 refills | Status: DC
  Filled 2022-05-22: qty 60, 30d supply, fill #0

## 2022-05-22 MED ORDER — DIAZEPAM 5 MG PO TABS
5.0000 mg | ORAL_TABLET | Freq: Three times a day (TID) | ORAL | 1 refills | Status: DC | PRN
  Filled 2022-05-22: qty 90, 30d supply, fill #0

## 2022-06-02 ENCOUNTER — Other Ambulatory Visit: Payer: Medicaid Other

## 2022-06-09 DIAGNOSIS — R319 Hematuria, unspecified: Secondary | ICD-10-CM | POA: Diagnosis not present

## 2022-06-09 DIAGNOSIS — R300 Dysuria: Secondary | ICD-10-CM | POA: Diagnosis not present

## 2022-06-09 DIAGNOSIS — N390 Urinary tract infection, site not specified: Secondary | ICD-10-CM | POA: Diagnosis not present

## 2022-06-21 ENCOUNTER — Other Ambulatory Visit (HOSPITAL_BASED_OUTPATIENT_CLINIC_OR_DEPARTMENT_OTHER): Payer: Self-pay

## 2022-08-03 ENCOUNTER — Other Ambulatory Visit (HOSPITAL_BASED_OUTPATIENT_CLINIC_OR_DEPARTMENT_OTHER): Payer: Self-pay

## 2022-10-14 ENCOUNTER — Ambulatory Visit (HOSPITAL_COMMUNITY)
Admission: EM | Admit: 2022-10-14 | Discharge: 2022-10-14 | Disposition: A | Discharge status: Home / Self Care | Payer: MEDICAID | Attending: Urology | Admitting: Urology

## 2022-10-14 DIAGNOSIS — F1910 Other psychoactive substance abuse, uncomplicated: Secondary | ICD-10-CM | POA: Insufficient documentation

## 2022-10-14 NOTE — Discharge Instructions (Addendum)

## 2022-10-14 NOTE — ED Provider Notes (Signed)
Behavioral [facility] Urgent Care Medical Screening Exam  Patient Name: Leslie [name] MRN: [id number] Date of Evaluation: 10/14/22 Chief Complaint:   Diagnosis:  Final diagnoses:  Substance abuse (HCC)    History of Present illness: Leslie [name] is a 42 y.o. 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 Patient presented voluntarily unaccompanied to [facility] for unclear reasons.   This nurse practitioner met with patient face-to-face and reviewed her chart.  On assessment, patient is alert and oriented.  She is agitated and appears to be under the influence of substance. She reports that she is prescribed Haldol but stopped taking this medication yesterday because "they were out of date." She says she has only missed today's dose. She is refusing to participate in assessment. She repeated states that she does not want or need any help. When asked what prompted her visit to this facility? she states: "I don't know, I don't want to talk about it." This provider attempted several times to engage patient in assessment but patient increasingly became upset, later refusing to talk/answer question, only stating "I came in here voluntary, if I'm not IVC or under arrest I want to leave now." She denies suicidal and homicidal ideation, hallucination, and paranoia.   Patient gave verbal permission for this provider to contact her father Leslie [name] for collateral at [phone]. Leslie [name] contacted and reports no safety concerns; while on the phone with Leslie [name], patient's mother can be add voicing concerning about patient drug use. She reports that they would like patient to get treatment for drug abuse. Provider explained that time time patient does not meet criteria for IVC.   Flowsheet Row ED from 03/17/2022 in [facility] Emergency Department at [facility] ED from 03/06/2022 in [facility] Emergency Department at [facility] ED from 09/24/2020 in [facility]  Emergency Department at [facility]  C-SSRS RISK CATEGORY No Risk No Risk No Risk       Psychiatric Specialty Exam  Presentation  General Appearance:Appropriate for Environment  Eye Contact:Good  Speech:Pressured  Speech Volume:Increased  Handedness:Right   Mood and Affect  Mood: Irritable; Angry  Affect: Blunt   Thought Process  Thought Processes: Disorganized  Descriptions of Associations:Intact  Orientation:Full (Time, Place and Person)  Thought Content:WDL  Diagnosis of Schizophrenia or Schizoaffective disorder in past: No   Hallucinations:None  Ideas of Reference:None  Suicidal Thoughts:No  Homicidal Thoughts:No   Sensorium  Memory: Immediate Good  Judgment: Poor  Insight: Poor   Executive Functions  Concentration: Fair  Attention Span: Fair  Recall: [employer] of Knowledge: Fair  Language: Fair   Psychomotor Activity  Psychomotor Activity: Restlessness   Assets  Assets: Social Support; Physical Health   Sleep  Sleep: Fair  Number of hours:  5   Physical Exam: Physical Exam Constitutional:      General: She is not in acute distress.    Appearance: She is not ill-appearing or toxic-appearing.  HENT:     Head: Normocephalic and atraumatic.  Cardiovascular:     Rate and Rhythm: Tachycardia present.     Comments: Refused recheck, she is irritable and does not want to participate in assessment.  Pulmonary:     Effort: Pulmonary effort is normal.  Skin:    Comments: Patient refused; she is irritable and does not want to participate in assessment.   Neurological:     Mental Status: She is alert and oriented to person, place, and time.  Psychiatric:  Attention and Perception: Attention and perception normal.        Mood and Affect: Mood is anxious. Affect is angry.        Speech: Speech normal.        Behavior: Behavior is uncooperative.        Thought Content: Thought content is not paranoid.  Thought content does not include homicidal or suicidal ideation. Thought content does not include homicidal or suicidal plan.    Review of Systems  Unable to perform ROS: Other (Patient refused; she is irritable and does not want to participate in assessment.)  Psychiatric/Behavioral:  Negative for suicidal ideas. The patient is nervous/anxious.    Blood pressure (!) 164/94, pulse (!) 121, temperature 99.2 F (37.3 C), temperature source Oral, resp. rate 20, SpO2 99%. There is no height or weight on file to calculate BMI.  Musculoskeletal: Strength & Muscle Tone:  unable to assess Gait & Station: normal Patient leans:  unable to assess   [facility] MSE Discharge Disposition for Follow up and Recommendations: Patient refused to participate in assessment, stating she does not help or want help. Patient denies safety concerns including SI, HI, AVH, paranoia.    Maricela Bo, NP 10/14/2022, 11:12 PM

## 2022-10-14 NOTE — ED Notes (Signed)
Patient walked out to lobby on her own accord.  Still cussing and yelling at staff and person she made a phone call to.  Patient called someone to pick her up and she left the building

## 2022-10-14 NOTE — Progress Notes (Signed)
   10/14/22 0123  [facility] Triage Screening (Walk-ins at [facility] only)  How Did You Hear About [place]? Family/Friend  What Is the Reason for Your Visit/Call Today? Leslie [name] is a 42 year old female presenting as a voluntary walk-in to [facility]. When asked, why are you here, patient states "I don't know, I don't want to". Patient rambles and appears to be agitated. Patient states "do you hear him I know he is there". Patient unable to share the reason for her statements. Patient continues to say "I am not talking about it". Patient refused assessment.  How Long Has This Been Causing You Problems?  (patient refused to answer)  Have You Recently Had Any Thoughts About Hurting Yourself?  (patient refused to answer)  Are You Planning to Commit Suicide/Harm Yourself At This time?  (patient refused to answer)  Have you Recently Had Thoughts About Hurting Someone Leslie [name]?  (patient refused to answer)  Are You Planning To Harm Someone At This Time?  (patient refused to answer)  Are you currently experiencing any auditory, visual or other hallucinations?  (patient refused to answer)  Have You Used Any Alcohol or Drugs in the Past 24 Hours?  (patient refused to answer)  Do you have any current medical co-morbidities that require immediate attention?  (patient refused to answer)  Clinician description of patient physical appearance/behavior: agitated  What Do You Feel Would Help You the Most Today?  (patient refused to answer)  If access to [facility] was not available, would you have sought care in the Emergency Department?  (patient refused to answer)  Determination of Need Routine (7 days)  Options For Referral Outpatient Therapy;Medication Management;Other: Comment    Flowsheet Row ED from 03/17/2022 in [facility] Emergency Department at [facility] ED from 03/06/2022 in [facility] Emergency Department at [facility] ED from 09/24/2020 in [facility] Emergency Department at [facility]  C-SSRS RISK CATEGORY No Risk No Risk No Risk

## 2022-10-20 ENCOUNTER — Other Ambulatory Visit: Payer: Medicaid Other

## 2022-10-23 ENCOUNTER — Other Ambulatory Visit: Payer: Self-pay | Admitting: Family Medicine

## 2022-10-23 ENCOUNTER — Ambulatory Visit
Admission: RE | Admit: 2022-10-23 | Discharge: 2022-10-23 | Disposition: A | Discharge status: Home / Self Care | Payer: MEDICAID | Source: Ambulatory Visit | Attending: Family Medicine | Admitting: Family Medicine

## 2022-10-23 DIAGNOSIS — R928 Other abnormal and inconclusive findings on diagnostic imaging of breast: Secondary | ICD-10-CM

## 2022-10-23 DIAGNOSIS — N6320 Unspecified lump in the left breast, unspecified quadrant: Secondary | ICD-10-CM

## 2023-02-23 ENCOUNTER — Other Ambulatory Visit (HOSPITAL_BASED_OUTPATIENT_CLINIC_OR_DEPARTMENT_OTHER): Payer: Self-pay

## 2023-02-23 MED ORDER — AMPHETAMINE-DEXTROAMPHETAMINE 30 MG PO TABS
30.0000 mg | ORAL_TABLET | Freq: Two times a day (BID) | ORAL | 0 refills | Status: DC
  Filled 2023-02-26: qty 60, 30d supply, fill #0

## 2023-02-26 ENCOUNTER — Other Ambulatory Visit (HOSPITAL_BASED_OUTPATIENT_CLINIC_OR_DEPARTMENT_OTHER): Payer: Self-pay

## 2023-04-18 ENCOUNTER — Encounter: Payer: Self-pay | Admitting: Family Medicine

## 2023-04-18 ENCOUNTER — Other Ambulatory Visit: Payer: Self-pay | Admitting: Family Medicine

## 2023-04-18 DIAGNOSIS — N6320 Unspecified lump in the left breast, unspecified quadrant: Secondary | ICD-10-CM

## 2023-04-23 ENCOUNTER — Other Ambulatory Visit: Payer: MEDICAID

## 2023-05-16 NOTE — Progress Notes (Signed)
 [facility] [place]  Return Patient Visit [name] DOB: 06/21/1980  MRN: [id number] Visit Date: 05/16/2023  Encounter Provider: Laneta Tanda Agent, PA-C  Subjective  Leslie [name] is a 43 y.o. female who presents for No chief complaint on file. History of Present Illness The patient is a 43 year old female who presents for evaluation of onychomycosis, skin rash,  and back pain.  She has been experiencing a persistent burning sensation along her spine for several years, which has progressively worsened over the past month to the extent that it disrupts her sleep. The discomfort originates from her neck and extends down her spine, accompanied by a pins-and-needles sensation. She also reports tension in her shoulders and back pain upon palpation. Despite attempts to alleviate the discomfort through repositioning, maintaining an upright posture, and using over-the-counter medications such as Advil  and Flexeril, she has found no relief. She recalls a similar episode following the birth of her first child, which was successfully managed with a one-week course of Flexeril and Xanax. However, the symptoms recurred more frequently after a severe accident and subsequent minor accidents. She has sought chiropractic care, which provided significant relief, and has used muscle rubs for symptom management.  She reports a history of elevated blood pressure but notes that it is not consistently high. She does not endorse any recent stressors or systemic symptoms such as fevers or chills.  She has previusly been prescribed an oral medication that she believes was not terbinafine for her toenail condition. She has been trimming her toenails and applying over-the-counter treatments. She has also initiated treatment on her other foot, where the toenail appears to be detaching. Additionally, she reports that her pinky toe has completely fallen out.  She has been applying Neosporin to a persistent  skin lesion, initially suspected to be ringworm. She has not used any over-the-counter antifungal treatments. The lesion is non-pruritic and has lightened in color but remains present.  She has completed her treatment for hepatitis C and her liver function tests are normal. She has an upcoming appointment. When she tested, it came up negative.  She takes Haldol  prescribed by Dr. Vincente. She mentioned that Dr. Vincente does not accept Medicaid, which results in additional costs for her medications. She has been seeing Dr. Vincente for 10 years and is comfortable with her care. She is going to see someone at [facility] for a psychiatrist.  MEDICATIONS Current: Lamisil, Neosporin, Haldol    Objective  There were no vitals taken for this visit. Physical Exam The toenail growing out underneath appears healthy.  Vital Signs Blood pressure is 130/92.  Physical Exam Constitutional:      General: She is not in acute distress.    Appearance: Normal appearance. She is not ill-appearing, toxic-appearing or diaphoretic.  HENT:     Head: Normocephalic and atraumatic.     Right Ear: External ear normal.     Left Ear: External ear normal.     Nose: Nose normal.  Eyes:     Extraocular Movements: Extraocular movements intact.  Cardiovascular:     Rate and Rhythm: Normal rate and regular rhythm.     Heart sounds: Normal heart sounds. No murmur heard.    No friction rub. No gallop.  Pulmonary:     Effort: Pulmonary effort is normal. No respiratory distress.     Breath sounds: Normal breath sounds. No stridor. No wheezing, rhonchi or rales.  Musculoskeletal:     Cervical back: Normal range of motion and neck supple.  Feet:     Right foot:     Toenail Condition: Fungal disease present.    Left foot:     Toenail Condition: Fungal disease present. Skin:    General: Skin is warm and dry.     Capillary Refill: Capillary refill takes less than 2 seconds.     Findings: Rash (circular rash present on anterior right  lower leg) present.  Neurological:     General: No focal deficit present.     Mental Status: She is alert and oriented to person, place, and time. Mental status is at baseline.  Psychiatric:        Mood and Affect: Mood normal.        Behavior: Behavior normal.        Thought Content: Thought content normal.        Judgment: Judgment normal.       Assessment & Plan 1. Onychomycosis (Primary) -The over-the-counter treatment appears to be effective for the right foot, but the left foot may require oral medication. Terbinafine will be prescribed for the left foot. She is advised to monitor for symptoms such as pale stools, abdominal pain, new nausea, or vomiting, and to report any such symptoms promptly. A non-fasting liver function test will be scheduled in 1 month to monitor for potential side effects of terbinafine. - terbinafine (LamISIL) 250 mg tablet; Take 1 tablet (250 mg total) by mouth daily.  Dispense: 30 tablet; Refill: 3 - Liver Function Panel; Future  2. Rash -Lotrisone cream, a combination of an antifungal and steroid, will be prescribed to manage the rash and address any potential fungal component. - clotrimazole-betamethasone (LOTRISONE) 1-0.05 % cream; Apply 1 Application topically 2 (two) times a day.  Dispense: 15 g; Refill: 0  3. Other chronic back pain -A referral to a spine specialist will be made for further evaluation and imaging. Lidocaine  patches will be prescribed for symptomatic relief if covered by insurance; otherwise, they can be obtained over the counter. - Ambulatory referral to Orthopaedic Surgery; Future - lidocaine -menthol 4-1 % ptmd; Apply 3 patches topically daily.  Dispense: 30 patch; Refill: 0  She takes Haldol  prescribed by Dr. Vincente. She mentioned that Dr. Vincente does not accept Medicaid, which results in additional costs for her medications. She has been seeing Dr. Vincente for 10 years and is comfortable with her care. She is going to see someone at [facility]  for a psychiatrist. Advised that I cannot manage her psychiatric medications, although patient requests I manage Haldol .     Problem List Items Addressed This Visit   None   Please contact my office for worsening conditions or problems, and seek emergency medical treatment and/or call 911 if you or your family deems either necessary.  No follow-ups on file.  Portions of this note were created using [employer] Cop[employer] software. Although proofread, errors may be present.  I have personally spent 30 minutes involved in face-to-face and non-face-to-face activities for this patient on the day of the visit.  Professional time spent includes the following activities, in addition to those noted in the documentation:  - preparing to see the patient (e.g., review of recent and/or remote lab/imaging/study results, provider notes, and patient messages/phone calls available in current EMR, CareEverywhere, and scanned records) -obtaining and/or reviewing separately obtained history either through past provider notes, patient phone calls, and/or patient's family member(s)/caregiver(s) -performing a medically appropriate examination and/or evaluation -counseling and educating the patient/family/caregiver -ordering medications, tests, or procedures -documenting clinical information in the electronic  or other health record -reviewing most up to date studies or expert consensus guidelines for screening/diagnosing/treating pertinent conditions/symptoms -independently interpreting results (not separately reported) and communicating results to the patient/family/caregiver -care coordination (not separately reported) -referring and communicating with other health care professionals (when not separately reported)  Laneta Tanda Agent, PA-C 5:12 PM

## 2023-05-23 ENCOUNTER — Other Ambulatory Visit (HOSPITAL_BASED_OUTPATIENT_CLINIC_OR_DEPARTMENT_OTHER): Payer: Self-pay

## 2023-05-23 MED ORDER — AMPHETAMINE-DEXTROAMPHETAMINE 30 MG PO TABS
30.0000 mg | ORAL_TABLET | Freq: Two times a day (BID) | ORAL | 0 refills | Status: DC
  Filled 2023-05-26: qty 60, 30d supply, fill #0

## 2023-05-25 ENCOUNTER — Other Ambulatory Visit (HOSPITAL_COMMUNITY): Payer: Self-pay

## 2023-05-25 ENCOUNTER — Other Ambulatory Visit (HOSPITAL_BASED_OUTPATIENT_CLINIC_OR_DEPARTMENT_OTHER): Payer: Self-pay

## 2023-05-26 ENCOUNTER — Other Ambulatory Visit (HOSPITAL_BASED_OUTPATIENT_CLINIC_OR_DEPARTMENT_OTHER): Payer: Self-pay

## 2023-05-26 ENCOUNTER — Other Ambulatory Visit (HOSPITAL_COMMUNITY): Payer: Self-pay

## 2023-07-04 ENCOUNTER — Other Ambulatory Visit: Payer: MEDICAID

## 2023-10-24 NOTE — Progress Notes (Signed)
 [facility] [place]  Patient Information  Location Information: Patient State (at time of visit): [place]  Patient Location (at time of visit):Home/Other Non-Medical  Provider Location: Home Is provider licensed to provide clinical care in the current location/state of the patient? Yes  Consent  Consent:  Patient's identity was confirmed. Presenting condition or illness was discussed with the patient/personal representative. Current proposed treatment for presenting condition or illness was explained to patient/personal representative along with the likely benefits and any significant risks or complications associated with the provision of treatment by audio/video means. The patient/personal representative verbally authorized treatment to be provided by audio/video, which may include a limited review of patient's current health status, medication, or other treatment recommendations, patient education, and an opportunity to ask questions about condition and treatment. Verbal Consent Granted by Patient/Personal Representative:Yes   Visit Information: Video start/stop time: Start time: 10/24/2023 11:04 AM EDT End time: 10/24/2023 11:12 AM EDT  Video duration: 22m 26s  History of Present Illness   History of Present Illness The patient is a 43 year old female who presents for urinary symptoms.  She reports frequent urination and right-sided lower back pain, which she suspects may be indicative of another UTI. These symptoms have been present for approximately 2 days and appear to be worsening. She experiences mild burning during urination but has not noticed any blood in her urine. She also reports feeling unwell overall but does not experience abdominal pain, nausea, vomiting, fever, chills, or body aches. She has not observed any changes in vaginal discharge or itching. Her last UTI occurred about a year ago. She has attempted to alleviate her  symptoms by increasing her water  intake and consuming cranberry juice, but these measures have not been effective. She has a history of kidney stones and UTIs.  She is due for a mammogram. Video Exam  Physical Exam Constitutional:      Appearance: Normal appearance.  HENT:     Nose: Nose normal. No rhinorrhea.  Eyes:     General:        Right eye: No discharge.        Left eye: No discharge.     Extraocular Movements: Extraocular movements intact.     Conjunctiva/sclera: Conjunctivae normal.  Pulmonary:     Effort: Pulmonary effort is normal. No respiratory distress.     Breath sounds: No stridor.     Comments: Able to speak in fluid sentences with no gasping.  No audible wheeze. Skin:    Coloration: Skin is not jaundiced or pale.     Findings: No erythema or rash.  Neurological:     General: No focal deficit present.     Mental Status: She is alert and oriented to person, place, and time.  Psychiatric:        Mood and Affect: Mood normal.        Behavior: Behavior normal.        Thought Content: Thought content normal.    Diagnosis and Medical Decision Making  1. Suspected UTI (Primary) - Urinalysis with Reflex to Microscopic; Future - Urine Culture; Future - nitrofurantoin , macrocrystal-monohydrate, (MACROBID ) 100 mg capsule; Take 1 capsule (100 mg total) by mouth 2 (two) times a day for 5 days.  Dispense: 10 capsule; Refill: 0  2. Abnormality of left breast on screening mammogram - [facility] Breast Diag Tomo Bilat; Future   Assessment & Plan 1. Urinary Tract Infection (UTI): Acute. - Symptoms include frequent urination, slight burning during urination,  and lower back pain on the right side. The condition appears to be worsening over the past two days. - Collect urine sample today at 3:10 PM for culture to confirm diagnosis and guide antibiotic selection. - Prescribe Macrobid , to be taken twice daily for 5 days. Start medication only after providing urine sample to avoid  skewing results. Prescription sent to pharmacy. Inform if urine culture results necessitate change in treatment.  2. Health Maintenance. - Due for mammogram, last done 10/23/2022 with recommended 6 month left diagnostic mammogram. Order placed for mammogram. Contact to schedule appointment.  Follow-up - Urine sample collection at 3:10 PM today. - Mammogram scheduling. - Follow-up as needed for new or worsening symptoms or if no improvement in the next several days.  Disposition  If condition(s) worsens, or not improving [place] needs re-evaluation Disposition:  Patient to continue care at home.

## 2023-11-29 ENCOUNTER — Ambulatory Visit (HOSPITAL_COMMUNITY)
Admission: EM | Admit: 2023-11-29 | Discharge: 2023-11-29 | Disposition: A | Discharge status: Home / Self Care | Payer: MEDICAID | Attending: Mental Health | Admitting: Mental Health

## 2023-11-29 ENCOUNTER — Other Ambulatory Visit (HOSPITAL_COMMUNITY)
Admission: EM | Admit: 2023-11-29 | Discharge: 2023-12-03 | Disposition: A | Discharge status: Home / Self Care | Payer: MEDICAID | Source: Intra-hospital | Attending: Psychiatry | Admitting: Psychiatry

## 2023-11-29 DIAGNOSIS — F1920 Other psychoactive substance dependence, uncomplicated: Secondary | ICD-10-CM

## 2023-11-29 DIAGNOSIS — F419 Anxiety disorder, unspecified: Secondary | ICD-10-CM | POA: Insufficient documentation

## 2023-11-29 DIAGNOSIS — Z79899 Other long term (current) drug therapy: Secondary | ICD-10-CM | POA: Insufficient documentation

## 2023-11-29 DIAGNOSIS — F17210 Nicotine dependence, cigarettes, uncomplicated: Secondary | ICD-10-CM | POA: Diagnosis not present

## 2023-11-29 DIAGNOSIS — Z560 Unemployment, unspecified: Secondary | ICD-10-CM | POA: Insufficient documentation

## 2023-11-29 DIAGNOSIS — F29 Unspecified psychosis not due to a substance or known physiological condition: Secondary | ICD-10-CM | POA: Diagnosis not present

## 2023-11-29 DIAGNOSIS — F1994 Other psychoactive substance use, unspecified with psychoactive substance-induced mood disorder: Secondary | ICD-10-CM

## 2023-11-29 DIAGNOSIS — Z653 Problems related to other legal circumstances: Secondary | ICD-10-CM | POA: Insufficient documentation

## 2023-11-29 DIAGNOSIS — F15159 Other stimulant abuse with stimulant-induced psychotic disorder, unspecified: Secondary | ICD-10-CM | POA: Insufficient documentation

## 2023-11-29 DIAGNOSIS — F1590 Other stimulant use, unspecified, uncomplicated: Secondary | ICD-10-CM | POA: Diagnosis present

## 2023-11-29 DIAGNOSIS — F19959 Other psychoactive substance use, unspecified with psychoactive substance-induced psychotic disorder, unspecified: Secondary | ICD-10-CM

## 2023-11-29 DIAGNOSIS — F1524 Other stimulant dependence with stimulant-induced mood disorder: Secondary | ICD-10-CM | POA: Diagnosis not present

## 2023-11-29 MED ORDER — HYDROXYZINE HCL 25 MG PO TABS
25.0000 mg | ORAL_TABLET | Freq: Three times a day (TID) | ORAL | Status: DC | PRN
  Administered 2023-12-03: 25 mg via ORAL
  Filled 2023-11-29: qty 1

## 2023-11-29 MED ORDER — ALUM & MAG HYDROXIDE-SIMETH 200-200-20 MG/5ML PO SUSP: 30.0000 mL | ORAL | Status: DC | PRN

## 2023-11-29 MED ORDER — DIPHENHYDRAMINE HCL 50 MG/ML IJ SOLN: 50.0000 mg | Freq: Three times a day (TID) | INTRAMUSCULAR | Status: DC | PRN

## 2023-11-29 MED ORDER — DIPHENHYDRAMINE HCL 50 MG PO CAPS: 50.0000 mg | ORAL_CAPSULE | Freq: Three times a day (TID) | ORAL | Status: DC | PRN

## 2023-11-29 MED ORDER — LORAZEPAM 2 MG/ML IJ SOLN: 2.0000 mg | Freq: Three times a day (TID) | INTRAMUSCULAR | Status: DC | PRN

## 2023-11-29 MED ORDER — MAGNESIUM HYDROXIDE 400 MG/5ML PO SUSP: 30.0000 mL | Freq: Every day | ORAL | Status: DC | PRN

## 2023-11-29 MED ORDER — LOPERAMIDE HCL 2 MG PO CAPS: 2.0000 mg | ORAL_CAPSULE | ORAL | Status: AC | PRN

## 2023-11-29 MED ORDER — ONDANSETRON 4 MG PO TBDP: 4.0000 mg | ORAL_TABLET | Freq: Four times a day (QID) | ORAL | Status: AC | PRN

## 2023-11-29 MED ORDER — GABAPENTIN 300 MG PO CAPS
600.0000 mg | ORAL_CAPSULE | Freq: Every day | ORAL | Status: DC
Start: 2023-11-29 — End: 2023-12-03
  Administered 2023-11-29 – 2023-12-02 (×4): 600 mg via ORAL
  Filled 2023-11-29 (×4): qty 2

## 2023-11-29 MED ORDER — LORAZEPAM 1 MG PO TABS
1.0000 mg | ORAL_TABLET | Freq: Four times a day (QID) | ORAL | Status: AC | PRN
Start: 2023-11-29 — End: 2023-12-02

## 2023-11-29 MED ORDER — ACETAMINOPHEN 325 MG PO TABS
650.0000 mg | ORAL_TABLET | Freq: Four times a day (QID) | ORAL | Status: DC | PRN
  Administered 2023-12-01 – 2023-12-02 (×2): 650 mg via ORAL
  Filled 2023-11-29 (×2): qty 2

## 2023-11-29 MED ORDER — HALOPERIDOL LACTATE 5 MG/ML IJ SOLN: 5.0000 mg | Freq: Three times a day (TID) | INTRAMUSCULAR | Status: DC | PRN

## 2023-11-29 MED ORDER — HALOPERIDOL 5 MG PO TABS
5.0000 mg | ORAL_TABLET | Freq: Every day | ORAL | Status: DC
Start: 2023-11-29 — End: 2023-12-03
  Administered 2023-11-29 – 2023-12-02 (×4): 5 mg via ORAL
  Filled 2023-11-29 (×4): qty 1

## 2023-11-29 MED ORDER — TRAZODONE HCL 50 MG PO TABS
50.0000 mg | ORAL_TABLET | Freq: Every evening | ORAL | Status: DC | PRN
  Administered 2023-11-30: 50 mg via ORAL
  Filled 2023-11-29: qty 1

## 2023-11-29 MED ORDER — HALOPERIDOL LACTATE 5 MG/ML IJ SOLN: 10.0000 mg | Freq: Three times a day (TID) | INTRAMUSCULAR | Status: DC | PRN

## 2023-11-29 MED ORDER — HALOPERIDOL 5 MG PO TABS: 5.0000 mg | ORAL_TABLET | Freq: Three times a day (TID) | ORAL | Status: DC | PRN
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
## 2023-11-29 NOTE — BH Assessment (Signed)
 Duplicate note

## 2023-11-29 NOTE — ED Provider Notes (Signed)
 Facility Based Crisis Admission H&P  Date: 11/29/23 Patient Name: Leslie [name] MRN: [id number] Chief Complaint: I want to detox from meth  Diagnoses:  Final diagnoses:  Stimulant use disorder  Substance-induced psychotic disorder (HCC)    HPI: Leslie [name] is a 43 year old female with a past psychiatric history of substance induced psychosis and stimulant use disorder methamphetamine type who was prescribed Haldol  5 mg daily, gabapentin 1200 mg TID, D amphetamine  salt 30 mg BID, and Valium  5 mg TID PRN managed by Dr. Emilio Aurora.  She presents to [facility] due to wanting to detox from methamphetamine and ultimately go to a residential rehabilitation facility for her substance use.  She states that prior to coming to [facility], she called [facility] and they told her to come in detox in this facility.  She states that she is motivated to detox from methamphetamine due to the ongoing auditory hallucinations that she is experiencing along with being on probation.  She states that her current medication regimen is partially helpful for her auditory hallucinations.  She reports smoking methamphetamines off-and-on for years but recently has increased frequency to daily with most recent use being this morning.  She reports smoking cigarettes, about 1 pack every 2 days.  She denies alcohol, marijuana, and other illicit substance use at this time.  She denies SI, HI, and visual hallucinations.  She reports having auditory hallucinations at times telling her that they will come get her.  She reports taking Adderall for most days, most recently taking 3 days ago.  She states that she takes Haldol  daily and Valium  about 5 times a week.  She states that she takes gabapentin at night.  She states that she sees Dr. Aurora, most recently 1 month ago.  She currently lives with her cousin.  She is unemployed, on SSI.  She is single with 3 children ages 39 (currently living with patient's mother), 51, and 26.  She is currently  on probation and denies any upcoming court dates.  She denies access to a firearm.  PHQ 2-9:   Flowsheet Row ED from 11/29/2023 in [facility] ED from 03/17/2022 in [facility] Emergency Department at [facility] ED from 03/06/2022 in [facility] Emergency Department at [facility]  C-SSRS RISK CATEGORY High Risk No Risk No Risk      Total Time spent with patient: 30 minutes  Musculoskeletal  Strength & Muscle Tone: within normal limits Gait & Station: normal Patient leans: N/A  Psychiatric Specialty Exam  Presentation General Appearance: Appropriate for Environment; Fairly Groomed  Eye Contact:Fair  Speech:Slurred  Speech Volume:Normal  Handedness:Right   Mood and Affect  Mood:Depressed  Affect:Congruent   Thought Process  Thought Processes:Coherent  Descriptions of Associations:Intact  Orientation:Full (Time, Place and Person)  Thought Content:Logical  Diagnosis of Schizophrenia or Schizoaffective disorder in past: Yes Hallucinations:Hallucinations: Auditory Description of Auditory Hallucinations: Voices telling her that they will get her  Ideas of Reference:None  Suicidal Thoughts:Suicidal Thoughts: No  Homicidal Thoughts:Homicidal Thoughts: No   Sensorium  Memory:Remote Poor  Judgment:Impaired  Insight:Lacking   Executive Functions  Concentration:Fair  Attention Span:Fair  Recall:Poor  Fund of Knowledge:Fair  Language:Good   Psychomotor Activity  Psychomotor Activity:Psychomotor Activity: Normal   Assets  Assets:Resilience; Desire for Improvement; Housing   Sleep  Sleep:Sleep: Fair   Nutritional Assessment (For OBS and FBC admissions only) Has the patient had a weight loss or gain of 10 pounds or more in the last 3 months?: No Has  the patient had a decrease in food intake/or appetite?: No Does the patient have dental problems?: No Does the patient have eating habits or behaviors  that may be indicators of an eating disorder including binging or inducing vomiting?: No Has the patient recently lost weight without trying?: 0 Has the patient been eating poorly because of a decreased appetite?: 0 Malnutrition Screening Tool Score: 0    Physical Exam  General: Pleasant, well-appearing. No acute distress. Pulmonary: Normal effort. No wheezing or rales. Skin: No obvious rash or lesions. Neuro: A&Ox3.No focal deficit.  Review of Systems  No reported symptoms   Blood pressure 136/89, pulse (!) 114, temperature 97.7 F (36.5 C), temperature source Temporal, resp. rate 17, SpO2 99%. There is no height or weight on file to calculate BMI.  Past Psychiatric History:  Dx: Schizophrenia, substance induced psychosis Current medications: Adderall, Haldol , Valium , gabapentin Previous medications: Suboxone, most recently 1 month ago Psychiatrist: Dr. Vincente Therapist: Denies Hospitalizations: Yes, reporting in June 2024 but does not recall why History of SI: Per chart review had an overdose on heroin on May 2025 History of self harm: Denies    Is the patient at risk to self? No  Has the patient been a risk to self in the past 6 months? Yes .    Has the patient been a risk to self within the distant past? Yes   Is the patient a risk to others? No   Has the patient been a risk to others in the past 6 months? No   Has the patient been a risk to others within the distant past? No   Past Medical History: None reported Family History: None reported Social History: Living with her cousin, unemployed, single with 3 children, currently on probation, denies access to firearms  Last Labs:  No visits with results within 6 Month(s) from this visit.  Latest known visit with results is:  Admission on 03/17/2022, Discharged on 03/17/2022  Component Date Value Ref Range Status   WBC 03/17/2022 12.3 (H)  4.0 - 10.5 K/uL Final   RBC 03/17/2022 4.98  3.87 - 5.11 MIL/uL Final    Hemoglobin 03/17/2022 14.8  12.0 - 15.0 g/dL Final   HCT 97/90/7975 44.5  36.0 - 46.0 % Final   MCV 03/17/2022 89.4  80.0 - 100.0 fL Final   MCH 03/17/2022 29.7  26.0 - 34.0 pg Final   MCHC 03/17/2022 33.3  30.0 - 36.0 g/dL Final   RDW 97/90/7975 12.6  11.5 - 15.5 % Final   Platelets 03/17/2022 450 (H)  150 - 400 K/uL Final   nRBC 03/17/2022 0.0  0.0 - 0.2 % Final   Neutrophils Relative % 03/17/2022 62  % Final   Neutro Abs 03/17/2022 7.7  1.7 - 7.7 K/uL Final   Lymphocytes Relative 03/17/2022 28  % Final   Lymphs Abs 03/17/2022 3.4  0.7 - 4.0 K/uL Final   Monocytes Relative 03/17/2022 7  % Final   Monocytes Absolute 03/17/2022 0.9  0.1 - 1.0 K/uL Final   Eosinophils Relative 03/17/2022 2  % Final   Eosinophils Absolute 03/17/2022 0.2  0.0 - 0.5 K/uL Final   Basophils Relative 03/17/2022 1  % Final   Basophils Absolute 03/17/2022 0.1  0.0 - 0.1 K/uL Final   Immature Granulocytes 03/17/2022 0  % Final   Abs Immature Granulocytes 03/17/2022 0.05  0.00 - 0.07 K/uL Final   Performed at [facility], [address] [place], [place] [address]  Sodium 03/17/2022 138  135 - 145 mmol/L Final   Potassium 03/17/2022 3.9  3.5 - 5.1 mmol/L Final   Chloride 03/17/2022 104  98 - 111 mmol/L Final   CO2 03/17/2022 22  22 - 32 mmol/L Final   Glucose, Bld 03/17/2022 113 (H)  70 - 99 mg/dL Final   Glucose reference range applies only to samples taken after fasting for at least 8 hours.   BUN 03/17/2022 11  6 - 20 mg/dL Final   Creatinine, Ser 03/17/2022 0.68  0.44 - 1.00 mg/dL Final   Calcium 97/90/7975 9.2  8.9 - 10.3 mg/dL Final   Total Protein 97/90/7975 8.0  6.5 - 8.1 g/dL Final   Albumin 97/90/7975 3.9  3.5 - 5.0 g/dL Final   AST 97/90/7975 27  15 - 41 U/L Final   ALT 03/17/2022 35  0 - 44 U/L Final   Alkaline Phosphatase 03/17/2022 80  38 - 126 U/L Final   Total Bilirubin 03/17/2022 0.4  0.3 - 1.2 mg/dL Final   GFR, Estimated 03/17/2022 >60  >60 mL/min Final   Comment:  (NOTE) Calculated using the CKD-EPI Creatinine Equation (2021)    Anion gap 03/17/2022 12  5 - 15 Final   Performed at [facility], [address], [place], [place] [address]   Alcohol, Ethyl (B) 03/17/2022 <10  <10 mg/dL Final   Comment: (NOTE) Lowest detectable limit for serum alcohol is 10 mg/dL.  For medical purposes only. Performed at [facility], [address], [place], [place] [address]    Opiates 03/17/2022 NONE DETECTED  NONE DETECTED Final   Cocaine 03/17/2022 NONE DETECTED  NONE DETECTED Final   Benzodiazepines 03/17/2022 NONE DETECTED  NONE DETECTED Final   Amphetamines 03/17/2022 POSITIVE (A)  NONE DETECTED Final   Tetrahydrocannabinol 03/17/2022 POSITIVE (A)  NONE DETECTED Final   Barbiturates 03/17/2022 NONE DETECTED  NONE DETECTED Final   Comment: (NOTE) DRUG SCREEN FOR MEDICAL PURPOSES ONLY.  IF CONFIRMATION IS NEEDED FOR ANY PURPOSE, NOTIFY LAB WITHIN 5 DAYS.  LOWEST DETECTABLE LIMITS FOR URINE DRUG SCREEN Drug Class                     Cutoff (ng/mL) Amphetamine  and metabolites    1000 Barbiturate and metabolites    200 Benzodiazepine                 200 Opiates and metabolites        300 Cocaine and metabolites        300 THC                            50 Performed at [facility], [address] [place], [place] [address]     Allergies: Cymbalta [duloxetine hcl], Seroquel  [quetiapine  fumarate], and Lithium    Long Term Goals: Improvement in symptoms so as ready for discharge  Short Term Goals: Patient will verbalize feelings in meetings with treatment team members., Patient will attend at least of 50% of the groups daily., Pt will complete the PHQ9 on admission, day 3 and discharge., Patient will participate in completing the [place] Suicide Severity Rating Scale, Patient will score a low risk of violence for 24 hours prior to discharge, and Patient will take medications as prescribed  daily.  Medical Decision Making   #Schizophrenia versus substance-induced psychosis - Restart home Haldol  5 mg nightly - Restart home gabapentin 600 mg nightly - Holding  as needed Valium  and Adderall at this time - Start as needed hydroxyzine  and trazodone   #Long Term Benzodiazepine Use on Valium  - CIWA with as needed Ativan  - Start as needed Zofran , milk of magnesia, Imodium   Labs ordered: CBC, CMP, ethanol level, UDS, A1c, lipid panel, TSH EKG ordered  Disposition: Will transfer to [facility] with the goal of residential rehabilitation  Recommendations  Based on my evaluation the patient does not appear to have an emergency medical condition.  Ismael KATHEE Franco, MD 11/29/23  7:20 PM

## 2023-11-29 NOTE — Discharge Instructions (Addendum)
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
## 2023-11-30 ENCOUNTER — Encounter (HOSPITAL_COMMUNITY): Payer: Self-pay | Admitting: Psychiatry

## 2023-11-30 DIAGNOSIS — F1524 Other stimulant dependence with stimulant-induced mood disorder: Secondary | ICD-10-CM | POA: Diagnosis not present

## 2023-11-30 DIAGNOSIS — F29 Unspecified psychosis not due to a substance or known physiological condition: Secondary | ICD-10-CM | POA: Diagnosis not present

## 2023-11-30 DIAGNOSIS — Z79899 Other long term (current) drug therapy: Secondary | ICD-10-CM | POA: Diagnosis not present

## 2023-11-30 DIAGNOSIS — F17210 Nicotine dependence, cigarettes, uncomplicated: Secondary | ICD-10-CM | POA: Diagnosis not present

## 2023-11-30 MED ORDER — BENZOCAINE 10 % MT GEL
1.0000 | Freq: Three times a day (TID) | OROMUCOSAL | Status: DC | PRN
  Administered 2023-12-01 (×2): 1 via OROMUCOSAL
  Filled 2023-11-30: qty 9
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
## 2023-11-30 NOTE — ED Notes (Signed)
 Patient is in the bedroom calm and sleeping. NAD Will monitor for safety.

## 2023-11-30 NOTE — Progress Notes (Signed)
 Patient is resting with eyes closed at this time.  No acute distress noted.  Respirations present and unlabored.  No current issues or concerns noted.

## 2023-11-30 NOTE — Progress Notes (Signed)
 Per CIWA protocol while awake, patient is currently resting with eyes closed.  No acute distress noted. Respirations present and unlabored.  No signs or symptoms displayed at this time.  Will continue to monitor.

## 2023-11-30 NOTE — [facility] Group Notes (Signed)
 Spiritual Care and Counseling Group Note  11/30/2023 2:45pm  Facilitated by: Librada Donnice Lin   Type of Therapy and Topic:  Hope    Participation Level:  Did Not Attend  Description of Group:  Group focused on topic of hope.  Patients participated in facilitated discussion around topic, connecting with one another around experiences and definitions for hope.  Group members engaged with group word cloud.  Members selected an image of what hope looks like for them today.  Group engaged in discussion around how their definitions of hope are present today in hospital.      Summary of Patient Progress:   DID NOT ATTEND  Therapeutic Modalities: Psycho-social ed, Adlerian, Narrative, MI   Librada Donnice Lin, Chaplain 11/30/2023 4:25 PM

## 2023-11-30 NOTE — Progress Notes (Signed)
 Patient is currently resting at this time with no acute distress noted.  Respirations present and unlabored.  No issues or concerns noted at this time.

## 2023-11-30 NOTE — Care Management (Addendum)
 [facility] Care Management...  Writer met with the patient and discussed discharge planning.  Patient requests inpatient substance abuse treatment.    Patient reported MH/SA [place], last use a couple days ago. Patient reported being on probation for about 4 months.   Patient asked if writer can Programme researcher, broadcasting/film/video and advise of current location. Patient did not have contact number    Patient has been referred to [facility] and [facility].   12:29 pm  Patient referral received at [facility].   Writer provided patient with phone number [phone] to complete phone screening  12:39 pm  Writer reached out to patients Probation Officer [place] [phone]  Writer left message to return call     1:16 pm Per Rosaline at [place]. Patient does not meet criteria for their program (valium  meds)

## 2023-11-30 NOTE — ED Notes (Addendum)
 Patient arrived to the unit at 2145, and oriented to the unit.  Patient is alert and oriented x 4 with no acute distress noted.  Patient is calm and cooperative at this time.  Patient currently denies SI/HI/AVH, and also denies pain/discomfort. Patient received food and drink upon arrival to the unit.  Patient received scheduled medications during shift.  No further issues or concerns noted at this time.  Will continue with safety checks Q 15 min.

## 2023-11-30 NOTE — ED Provider Notes (Signed)
 Behavioral Health Progress Note  Date and Time: 11/30/2023 1:27 PM Name: Leslie [name] MRN:  [id number]  Subjective:  Patient reports OK mood today. She slept pretty good which she claims is fairly typical for her. She's still hearing voices which vary from a single voice up to a crowd of people saying mean things. Historically, haloperidol  was effective for voices worked for a long time. Patient claims to take haloperidol  BID. She does not know why that is no longer the case. She does not endorse hypothesis that substance use could play a role. She denies current withdrawal symptoms. Preferred substance is methamphetamine which she smokes. Patient equivocal about treatment noting that she has to move forwards with treatment due to probation. She is interested in [place] but open to [facility] as well.  Diagnosis:  Final diagnoses:  Substance induced mood disorder (HCC)  Polysubstance dependence (HCC)  Stimulant use disorder (methamphetamine)  Total Time spent with patient: I personally spent 20 minutes on the unit in direct patient care. The direct patient care time included face-to-face time with the patient, reviewing the patient's chart, communicating with other professionals, and coordinating care. Greater than 50% of this time was spent in counseling or coordinating care with the patient regarding goals of hospitalization, psycho-education, and discharge planning needs.  Admission HPI: Leslie [name] is a 43 year old female with a past psychiatric history of substance induced psychosis and stimulant use disorder methamphetamine type who was prescribed Haldol  5 mg daily, gabapentin 1200 mg TID, D amphetamine  salt 30 mg BID, and Valium  5 mg TID PRN managed by Dr. Emilio Aurora.  She presents to [facility] due to wanting to detox from methamphetamine and ultimately go to a residential rehabilitation facility for her substance use.  She states that prior to coming to [facility], she called [facility] and they told her to  come in detox in this facility.  She states that she is motivated to detox from methamphetamine due to the ongoing auditory hallucinations that she is experiencing along with being on probation.  She states that her current medication regimen is partially helpful for her auditory hallucinations.   She reports smoking methamphetamines off-and-on for years but recently has increased frequency to daily with most recent use being this morning.  She reports smoking cigarettes, about 1 pack every 2 days.  She denies alcohol, marijuana, and other illicit substance use at this time.  She denies SI, HI, and visual hallucinations.  She reports having auditory hallucinations at times telling her that they will come get her.   She reports taking Adderall for most days, most recently taking 3 days ago.  She states that she takes Haldol  daily and Valium  about 5 times a week.  She states that she takes gabapentin at night.  She states that she sees Dr. Aurora, most recently 1 month ago.   She currently lives with her cousin.  She is unemployed, on SSI.  She is single with 3 children ages 27 (currently living with patient's mother), 8, and 39.  She is currently on probation and denies any upcoming court dates.  She denies access to a firearm.      Sleep: Good  Appetite:  Fair  Current Medications:  Current Facility-Administered Medications  Medication Dose Route Frequency Provider Last Rate Last Admin   acetaminophen  (TYLENOL ) tablet 650 mg  650 mg Oral Q6H PRN Hoang, Daniela B, MD       alum & mag hydroxide-simeth (MAALOX/MYLANTA) 200-200-20 MG/5ML suspension 30 mL  30 mL Oral  Q4H PRN Hoang, Daniela B, MD       haloperidol  (HALDOL ) tablet 5 mg  5 mg Oral TID PRN Hoang, Daniela B, MD       And   diphenhydrAMINE  (BENADRYL ) capsule 50 mg  50 mg Oral TID PRN Hoang, Daniela B, MD       haloperidol  lactate (HALDOL ) injection 5 mg  5 mg Intramuscular TID PRN Hoang, Daniela B, MD       And   diphenhydrAMINE   (BENADRYL ) injection 50 mg  50 mg Intramuscular TID PRN Hoang, Daniela B, MD       And   LORazepam  (ATIVAN ) injection 2 mg  2 mg Intramuscular TID PRN Hoang, Daniela B, MD       haloperidol  lactate (HALDOL ) injection 10 mg  10 mg Intramuscular TID PRN Hoang, Daniela B, MD       And   diphenhydrAMINE  (BENADRYL ) injection 50 mg  50 mg Intramuscular TID PRN Hoang, Daniela B, MD       And   LORazepam  (ATIVAN ) injection 2 mg  2 mg Intramuscular TID PRN Hoang, Daniela B, MD       gabapentin (NEURONTIN) capsule 600 mg  600 mg Oral QHS Hoang, Daniela B, MD   600 mg at 11/29/23 2235   haloperidol  (HALDOL ) tablet 5 mg  5 mg Oral QHS Hoang, Daniela B, MD   5 mg at 11/29/23 2235   hydrOXYzine  (ATARAX ) tablet 25 mg  25 mg Oral TID PRN Hoang, Daniela B, MD       loperamide  (IMODIUM ) capsule 2-4 mg  2-4 mg Oral PRN Hoang, Daniela B, MD       LORazepam  (ATIVAN ) tablet 1 mg  1 mg Oral Q6H PRN Hoang, Daniela B, MD       magnesium  hydroxide (MILK OF MAGNESIA) suspension 30 mL  30 mL Oral Daily PRN Hoang, Daniela B, MD       ondansetron  (ZOFRAN -ODT) disintegrating tablet 4 mg  4 mg Oral Q6H PRN Hoang, Daniela B, MD       traZODone  (DESYREL ) tablet 50 mg  50 mg Oral QHS PRN Hoang, Daniela B, MD       Current Outpatient Medications  Medication Sig Dispense Refill   amphetamine -dextroamphetamine  (ADDERALL) 30 MG tablet Take 30 mg by mouth 2 (two) times daily.     diazepam  (VALIUM ) 5 MG tablet Take 5 mg by mouth 3 (three) times daily as needed for anxiety.     gabapentin (NEURONTIN) 600 MG tablet Take 1,200 mg by mouth at bedtime.     haloperidol  (HALDOL ) 5 MG tablet Take 5-10 mg by mouth 2 (two) times daily. Take two tablets in the morning and one tablet at 12 noon      Labs  Lab Results:  No visits with results within 6 Month(s) from this visit.  Latest known visit with results is:  Admission on 03/17/2022, Discharged on 03/17/2022  Component Date Value Ref Range Status   WBC 03/17/2022 12.3 (H)  4.0 - 10.5  K/uL Final   RBC 03/17/2022 4.98  3.87 - 5.11 MIL/uL Final   Hemoglobin 03/17/2022 14.8  12.0 - 15.0 g/dL Final   HCT 97/90/7975 44.5  36.0 - 46.0 % Final   MCV 03/17/2022 89.4  80.0 - 100.0 fL Final   MCH 03/17/2022 29.7  26.0 - 34.0 pg Final   MCHC 03/17/2022 33.3  30.0 - 36.0 g/dL Final   RDW 97/90/7975 12.6  11.5 - 15.5 % Final   Platelets 03/17/2022  450 (H)  150 - 400 K/uL Final   nRBC 03/17/2022 0.0  0.0 - 0.2 % Final   Neutrophils Relative % 03/17/2022 62  % Final   Neutro Abs 03/17/2022 7.7  1.7 - 7.7 K/uL Final   Lymphocytes Relative 03/17/2022 28  % Final   Lymphs Abs 03/17/2022 3.4  0.7 - 4.0 K/uL Final   Monocytes Relative 03/17/2022 7  % Final   Monocytes Absolute 03/17/2022 0.9  0.1 - 1.0 K/uL Final   Eosinophils Relative 03/17/2022 2  % Final   Eosinophils Absolute 03/17/2022 0.2  0.0 - 0.5 K/uL Final   Basophils Relative 03/17/2022 1  % Final   Basophils Absolute 03/17/2022 0.1  0.0 - 0.1 K/uL Final   Immature Granulocytes 03/17/2022 0  % Final   Abs Immature Granulocytes 03/17/2022 0.05  0.00 - 0.07 K/uL Final   Performed at [facility], [address] [place], [place] [address]   Sodium 03/17/2022 138  135 - 145 mmol/L Final   Potassium 03/17/2022 3.9  3.5 - 5.1 mmol/L Final   Chloride 03/17/2022 104  98 - 111 mmol/L Final   CO2 03/17/2022 22  22 - 32 mmol/L Final   Glucose, Bld 03/17/2022 113 (H)  70 - 99 mg/dL Final   Glucose reference range applies only to samples taken after fasting for at least 8 hours.   BUN 03/17/2022 11  6 - 20 mg/dL Final   Creatinine, Ser 03/17/2022 0.68  0.44 - 1.00 mg/dL Final   Calcium 97/90/7975 9.2  8.9 - 10.3 mg/dL Final   Total Protein 97/90/7975 8.0  6.5 - 8.1 g/dL Final   Albumin 97/90/7975 3.9  3.5 - 5.0 g/dL Final   AST 97/90/7975 27  15 - 41 U/L Final   ALT 03/17/2022 35  0 - 44 U/L Final   Alkaline Phosphatase 03/17/2022 80  38 - 126 U/L Final   Total Bilirubin 03/17/2022 0.4  0.3 - 1.2 mg/dL Final    GFR, Estimated 03/17/2022 >60  >60 mL/min Final   Comment: (NOTE) Calculated using the CKD-EPI Creatinine Equation (2021)    Anion gap 03/17/2022 12  5 - 15 Final   Performed at [facility], [address], [place], [place] [address]   Alcohol, Ethyl (B) 03/17/2022 <10  <10 mg/dL Final   Comment: (NOTE) Lowest detectable limit for serum alcohol is 10 mg/dL.  For medical purposes only. Performed at [facility], [address] [place], [place] [address]    Opiates 03/17/2022 NONE DETECTED  NONE DETECTED Final   Cocaine 03/17/2022 NONE DETECTED  NONE DETECTED Final   Benzodiazepines 03/17/2022 NONE DETECTED  NONE DETECTED Final   Amphetamines 03/17/2022 POSITIVE (A)  NONE DETECTED Final   Tetrahydrocannabinol 03/17/2022 POSITIVE (A)  NONE DETECTED Final   Barbiturates 03/17/2022 NONE DETECTED  NONE DETECTED Final   Comment: (NOTE) DRUG SCREEN FOR MEDICAL PURPOSES ONLY.  IF CONFIRMATION IS NEEDED FOR ANY PURPOSE, NOTIFY LAB WITHIN 5 DAYS.  LOWEST DETECTABLE LIMITS FOR URINE DRUG SCREEN Drug Class                     Cutoff (ng/mL) Amphetamine  and metabolites    1000 Barbiturate and metabolites    200 Benzodiazepine                 200 Opiates and metabolites        300 Cocaine and metabolites        300  THC                            50 Performed at [facility], [address], [place], [place] [address]     Blood Alcohol level:  Lab Results  Component Value Date   [facility] <10 03/17/2022   ETH <10 09/10/2020    Metabolic Disorder Labs: Lab Results  Component Value Date   HGBA1C 5.4 07/06/2016   MPG 108 07/06/2016   MPG 114 12/19/2014   Lab Results  Component Value Date   PROLACTIN 64.2 (H) 07/06/2016   PROLACTIN 102.2 (H) 12/19/2014   Lab Results  Component Value Date   CHOL 179 07/06/2016   TRIG 256 (H) 07/06/2016   HDL 40 (L) 07/06/2016   CHOLHDL 4.5 07/06/2016   VLDL 51 (H) 07/06/2016   LDLCALC  88 07/06/2016   LDLCALC 101 (H) 12/19/2014    Therapeutic Lab Levels: Lab Results  Component Value Date   LITHIUM  0.23 (L) 07/04/2016   LITHIUM  0.34 (L) 07/01/2016   No results found for: VALPROATE No results found for: CBMZ  Physical Findings   AIMS    Flowsheet Row [facility]) [facility]  AIMS Total Score 0   AUDIT    Flowsheet Row Admission (Discharged) from [facility] Admission (Discharged[facility] Ad[facility] (Discharged[facility]  Alcohol Use Disorder Identification Test Final Score (AUDIT) 0 3 1   PHQ2-9    Flowsheet Row ED from 11/29/2023 in [facility]  PHQ-2 Total Score 1   Flowsheet Row ED from 11/29/2023 in [facility] Most recent reading at 11/29/2023  6:19 PM ED from 11/29/2023 in [facility] Most recent reading at 11/29/2023 12:53 AM ED from 03/17/2022 in [facility] Emergency Department at [facility] Most recent reading at 03/17/2022  2:07 AM  C-SSRS RISK CATEGORY High Risk High Risk No Risk     Musculoskeletal  Strength & Muscle Tone: within normal limits Gait & Station: normal Patient leans: N/A  Psychiatric Specialty Exam  Presentation  General Appearance:  Appropriate for Environment; Disheveled  Eye Contact: Minimal  Speech: Clear and Coherent  Speech Volume: Normal  Handedness: Right   Mood and Affect  Mood: Depressed  Affect: Congruent   Thought Process  Thought Processes: Coherent  Descriptions of Associations:Intact  Orientation:Full (Time, Place and Person)  Thought Content:Logical  Diagnosis of Schizophrenia or Schizoaffective disorder in past: No  Duration of Psychotic  Symptoms: Greater than six months   Hallucinations:Hallucinations: Auditory Description of Auditory Hallucinations: voices saying mean things. Sometimes solitary, sometimes a crowd  Ideas of Reference:None  Suicidal Thoughts:Suicidal Thoughts: No  Homicidal Thoughts:Homicidal Thoughts: No   Sensorium  Memory: Immediate Poor; Recent Poor  Judgment: Impaired  Insight: Shallow   Executive Functions  Concentration: Fair  Attention Span: Fair  Recall: Poor  Fund of Knowledge: Fair  Language: Fair   Psychomotor Activity  Psychomotor Activity: Psychomotor Activity: Normal  Assets  Assets: Housing  Sleep  Sleep: Sleep: Good  Estimated Sleeping Duration (Last 24 Hours): 7.25-9.25 hours  Nutritional Assessment (For OBS and [facility] admissions only) Has the patient had a weight loss or gain of 10 pounds or more in the last 3 months?:  No Has the patient had a decrease in food intake/or appetite?: No Does the patient have dental problems?: No Does the patient have eating habits or behaviors that may be indicators of an eating disorder including binging or inducing vomiting?: No Has the patient recently lost weight without trying?: 0 Has the patient been eating poorly because of a decreased appetite?: 0 Malnutrition Screening Tool Score: 0    Physical Exam  Physical Exam ROS Blood pressure (!) 145/85, pulse 99, temperature (!) 97.5 F (36.4 C), resp. rate 17, SpO2 100%. There is no height or weight on file to calculate BMI.  Treatment Plan Summary: Continue current plan of care including haloperidol , multimodal milieu therapies, and referral for residential substance treatment. Encouraged patient to optimize diurnal activity and participation in milieu. Intrinsic patient motivation appears low. Will re-attempt phlebotomy.    KANDI JAYSON HAHN, MD 11/30/2023 1:27 PM
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
## 2023-11-30 NOTE — ED Notes (Signed)
 No complaints or concerns reported to RN this shift thus far. No distress observed.

## 2023-11-30 NOTE — ED Notes (Signed)
 Lab work attempt by [employer] and Char[employer] fundraiser unsuccessful. RN provided water  for hydration.

## 2023-11-30 NOTE — ED Notes (Signed)
 Did not attend [facility] group.

## 2023-12-01 DIAGNOSIS — Z79899 Other long term (current) drug therapy: Secondary | ICD-10-CM | POA: Diagnosis not present

## 2023-12-01 DIAGNOSIS — F1524 Other stimulant dependence with stimulant-induced mood disorder: Secondary | ICD-10-CM | POA: Diagnosis not present

## 2023-12-01 DIAGNOSIS — F17210 Nicotine dependence, cigarettes, uncomplicated: Secondary | ICD-10-CM | POA: Diagnosis not present

## 2023-12-01 DIAGNOSIS — F29 Unspecified psychosis not due to a substance or known physiological condition: Secondary | ICD-10-CM | POA: Diagnosis not present

## 2023-12-01 LAB — COMPREHENSIVE METABOLIC PANEL WITH GFR
ALT: 9 U/L (ref 0–44)
AST: 13 U/L — ABNORMAL LOW (ref 15–41)
Albumin: 3.5 g/dL (ref 3.5–5.0)
Alkaline Phosphatase: 75 U/L (ref 38–126)
Anion gap: 11 (ref 5–15)
BUN: 16 mg/dL (ref 6–20)
CO2: 26 mmol/L (ref 22–32)
Calcium: 9 mg/dL (ref 8.9–10.3)
Chloride: 95 mmol/L — ABNORMAL LOW (ref 98–111)
Creatinine, Ser: 0.81 mg/dL (ref 0.44–1.00)
GFR, Estimated: >60 mL/min (ref >60)
Glucose, Bld: 101 mg/dL — ABNORMAL HIGH (ref 70–99)
Potassium: 3.8 mmol/L (ref 3.5–5.1)
Sodium: 132 mmol/L — ABNORMAL LOW (ref 135–145)
Total Bilirubin: 0.4 mg/dL (ref 0.0–1.2)
Total Protein: 6.5 g/dL (ref 6.5–8.1)

## 2023-12-01 LAB — TSH: TSH: 2.685 u[IU]/mL (ref 0.350–4.500)

## 2023-12-01 LAB — POCT URINE DRUG SCREEN - MANUAL ENTRY (I-SCREEN)
POC Amphetamine UR: POSITIVE — AB
POC Buprenorphine (BUP): NOT DETECTED
POC Cocaine UR: NOT DETECTED
POC Marijuana UR: NOT DETECTED
POC Methadone UR: NOT DETECTED
POC Methamphetamine UR: POSITIVE — AB
POC Morphine: NOT DETECTED
POC Oxazepam (BZO): POSITIVE — AB
POC Oxycodone UR: NOT DETECTED
POC Secobarbital (BAR): NOT DETECTED

## 2023-12-01 LAB — LIPID PANEL
Cholesterol: 212 mg/dL — ABNORMAL HIGH (ref 0–200)
HDL: 58 mg/dL (ref >40)
LDL Cholesterol: 124 mg/dL — ABNORMAL HIGH (ref 0–99)
Total CHOL/HDL Ratio: 3.7 ratio
Triglycerides: 150 mg/dL — ABNORMAL HIGH (ref <150)
VLDL: 30 mg/dL (ref 0–40)

## 2023-12-01 LAB — CBC WITH DIFFERENTIAL/PLATELET
Abs Immature Granulocytes: 0.08 K/uL — ABNORMAL HIGH (ref 0.00–0.07)
Basophils Absolute: 0.1 K/uL (ref 0.0–0.1)
Basophils Relative: 1 %
Eosinophils Absolute: 0.6 K/uL — ABNORMAL HIGH (ref 0.0–0.5)
Eosinophils Relative: 6 %
HCT: 42.3 % (ref 36.0–46.0)
Hemoglobin: 13.8 g/dL (ref 12.0–15.0)
Immature Granulocytes: 1 %
Lymphocytes Relative: 35 %
Lymphs Abs: 3.7 K/uL (ref 0.7–4.0)
MCH: 29.1 pg (ref 26.0–34.0)
MCHC: 32.6 g/dL (ref 30.0–36.0)
MCV: 89.1 fL (ref 80.0–100.0)
Monocytes Absolute: 0.5 K/uL (ref 0.1–1.0)
Monocytes Relative: 5 %
Neutro Abs: 5.3 K/uL (ref 1.7–7.7)
Neutrophils Relative %: 52 %
Platelets: 366 K/uL (ref 150–400)
RBC: 4.75 MIL/uL (ref 3.87–5.11)
RDW: 12.3 % (ref 11.5–15.5)
WBC: 10.4 K/uL (ref 4.0–10.5)
nRBC: 0 % (ref 0.0–0.2)

## 2023-12-01 LAB — HEMOGLOBIN A1C
Hgb A1c MFr Bld: 5 % (ref 4.8–5.6)
Mean Plasma Glucose: 96.8 mg/dL

## 2023-12-01 LAB — ETHANOL: Alcohol, Ethyl (B): <15 mg/dL (ref <15)

## 2023-12-01 NOTE — Progress Notes (Signed)
 Patient is alert and oriented x 4 with no acute distress noted.  Patient denies SI/HI/AVH, and denies pain/discomfort.  Patient is calm and cooperative and observed in the dayroom watching TV during shift.  No issues or concerns voiced at this time. Will continue to monitor Q 15 min safety checks.

## 2023-12-01 NOTE — Progress Notes (Signed)
 Patient is currently resting with eyes closed.  No acute distress noted.  Respirations present and unlabored.
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
## 2023-12-01 NOTE — ED Notes (Signed)
 Assumed care of patient @ 0730, pt lying in bed, denied SI, plan or intent and c/o tooth pain.
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
## 2023-12-01 NOTE — ED Provider Notes (Addendum)
 Behavioral Health Progress Note  Date and Time: 12/01/2023 5:50 PM Name: Leslie [name] MRN:  [id number]  HPI: Per admissions assessment: Leslie [name] is a 43 year old female with a past psychiatric history of substance induced psychosis and stimulant use disorder methamphetamine type who was prescribed Haldol  5 mg daily, gabapentin 1200 mg TID, D amphetamine  salt 30 mg BID, and Valium  5 mg TID PRN managed by Dr. Emilio [facility].  She presents to [facility] due to wanting to detox from methamphetamine and ultimately go to a residential rehabilitation facility for her substance use.  She states that prior to coming to [facility], she called [facility] and they told her to come in detox in this facility.  She states that she is motivated to detox from methamphetamine due to the ongoing auditory hallucinations that she is experiencing along with being on probation.   Today's Patient assessment:   The pt reports that their mood is improved since hospitalization, but as per objective Assessment mood is still very depressed. Reports that anxiety is resolving as per patient.  She is talking to writer while lying in bed, with eyes closed the entire time.  She however, denies feelings of anxiety. Sleep is good as per patient. Appetite is fair. Concentration is okay. Energy level is normal as per patient, as per objective assessment patient seems fatigued. Denies suicidal thoughts.  Denies suicidal intent and plan.  Denies having any HI.  Denies having psychotic symptoms.  Patient reports that the last time she experienced auditory hallucinations was the day of admission to this facility.  There are no overt signs of psychosis.  Denies having side effects to current psychiatric medications.  We discussed keeping current medication regimen the same with no changes for today. Discussed the following psychosocial stressors: Recurrent substance abuse, to which patient states that she is still determined to go to  rehabilitation after her stay at this location.  Per CSW's documentation, referrals are currently being sent to rehabilitation centers on patient's behalf.  Social work will follow-up with patient on Monday when they return.  Patient acknowledges this.  Labs reviewed:  sodium was 132 earlier today morning we are repeating a BMP for tomorrow morning to ascertain if levels are triggering upwards. HR is 120, nursing educated to recheck. Fluids are being encouraged (Gatorade so as to replenish electrolytes).  Diagnosis:  Final diagnoses:  Substance induced mood disorder (HCC)  Polysubstance dependence (HCC)  Stimulant use disorder (methamphetamine)  Total Time spent with patient: I personally spent 20 minutes on the unit in direct patient care. The direct patient care time included face-to-face time with the patient, reviewing the patient's chart, communicating with other professionals, and coordinating care. Greater than 50% of this time was spent in counseling or coordinating care with the patient regarding goals of hospitalization, psycho-education, and discharge planning needs.  Admission HPI: Leslie [name] is a 43 year old female with a past psychiatric history of substance induced psychosis and stimulant use disorder methamphetamine type who was prescribed Haldol  5 mg daily, gabapentin 1200 mg TID, D amphetamine  salt 30 mg BID, and Valium  5 mg TID PRN managed by Dr. Emilio Aurora.  She presents to [facility] due to wanting to detox from methamphetamine and ultimately go to a residential rehabilitation facility for her substance use.  She states that prior to coming to [facility], she called [facility] and they told her to come in detox in this facility.  She states that she is motivated to detox from methamphetamine due to the ongoing auditory hallucinations  that she is experiencing along with being on probation.  She states that her current medication regimen is partially helpful for her auditory hallucinations.    She reports smoking methamphetamines off-and-on for years but recently has increased frequency to daily with most recent use being this morning.  She reports smoking cigarettes, about 1 pack every 2 days.  She denies alcohol, marijuana, and other illicit substance use at this time.  She denies SI, HI, and visual hallucinations.  She reports having auditory hallucinations at times telling her that they will come get her.   She reports taking Adderall for most days, most recently taking 3 days ago.  She states that she takes Haldol  daily and Valium  about 5 times a week.  She states that she takes gabapentin at night.  She states that she sees Dr. Vincente, most recently 1 month ago.   She currently lives with her cousin.  She is unemployed, on SSI.  She is single with 3 children ages 16 (currently living with patient's mother), 59, and 15.  She is currently on probation and denies any upcoming court dates.  She denies access to a firearm.      Sleep: Good  Appetite:  Fair  Current Medications:  Current Facility-Administered Medications  Medication Dose Route Frequency Provider Last Rate Last Admin   acetaminophen  (TYLENOL ) tablet 650 mg  650 mg Oral Q6H PRN Hoang, Daniela B, MD   650 mg at 12/01/23 1216   alum & mag hydroxide-simeth (MAALOX/MYLANTA) 200-200-20 MG/5ML suspension 30 mL  30 mL Oral Q4H PRN Hoang, Daniela B, MD       benzocaine (ORAJEL) 10 % mucosal gel 1 Application  1 Application Mouth/Throat TID PRN Ajibola, Ene A, NP   1 Application at 12/01/23 1217   haloperidol  (HALDOL ) tablet 5 mg  5 mg Oral TID PRN Hoang, Daniela B, MD       And   diphenhydrAMINE  (BENADRYL ) capsule 50 mg  50 mg Oral TID PRN Hoang, Daniela B, MD       haloperidol  lactate (HALDOL ) injection 5 mg  5 mg Intramuscular TID PRN Hoang, Daniela B, MD       And   diphenhydrAMINE  (BENADRYL ) injection 50 mg  50 mg Intramuscular TID PRN Hoang, Daniela B, MD       And   LORazepam  (ATIVAN ) injection 2 mg  2 mg Intramuscular  TID PRN Hoang, Daniela B, MD       haloperidol  lactate (HALDOL ) injection 10 mg  10 mg Intramuscular TID PRN Hoang, Daniela B, MD       And   diphenhydrAMINE  (BENADRYL ) injection 50 mg  50 mg Intramuscular TID PRN Hoang, Daniela B, MD       And   LORazepam  (ATIVAN ) injection 2 mg  2 mg Intramuscular TID PRN Hoang, Daniela B, MD       gabapentin (NEURONTIN) capsule 600 mg  600 mg Oral QHS Hoang, Daniela B, MD   600 mg at 11/30/23 2109   haloperidol  (HALDOL ) tablet 5 mg  5 mg Oral QHS Hoang, Daniela B, MD   5 mg at 11/30/23 2109   hydrOXYzine  (ATARAX ) tablet 25 mg  25 mg Oral TID PRN Hoang, Daniela B, MD       loperamide  (IMODIUM ) capsule 2-4 mg  2-4 mg Oral PRN Hoang, Daniela B, MD       LORazepam  (ATIVAN ) tablet 1 mg  1 mg Oral Q6H PRN Hoang, Daniela B, MD       magnesium   hydroxide (MILK OF MAGNESIA) suspension 30 mL  30 mL Oral Daily PRN Hoang, Daniela B, MD       ondansetron  (ZOFRAN -ODT) disintegrating tablet 4 mg  4 mg Oral Q6H PRN Hoang, Daniela B, MD       traZODone  (DESYREL ) tablet 50 mg  50 mg Oral QHS PRN Hoang, Daniela B, MD   50 mg at 11/30/23 2109   Current Outpatient Medications  Medication Sig Dispense Refill   amphetamine -dextroamphetamine  (ADDERALL) 30 MG tablet Take 30 mg by mouth 2 (two) times daily.     diazepam  (VALIUM ) 5 MG tablet Take 5 mg by mouth 3 (three) times daily as needed for anxiety.     gabapentin (NEURONTIN) 600 MG tablet Take 1,200 mg by mouth at bedtime.     haloperidol  (HALDOL ) 5 MG tablet Take 5-10 mg by mouth 2 (two) times daily. Take two tablets in the morning and one tablet at 12 noon      Labs  Lab Results:  Admission on 11/29/2023  Component Date Value Ref Range Status   WBC 12/01/2023 10.4  4.0 - 10.5 K/uL Final   RBC 12/01/2023 4.75  3.87 - 5.11 MIL/uL Final   Hemoglobin 12/01/2023 13.8  12.0 - 15.0 g/dL Final   HCT 89/74/7974 42.3  36.0 - 46.0 % Final   MCV 12/01/2023 89.1  80.0 - 100.0 fL Final   MCH 12/01/2023 29.1  26.0 - 34.0 pg Final    MCHC 12/01/2023 32.6  30.0 - 36.0 g/dL Final   RDW 89/74/7974 12.3  11.5 - 15.5 % Final   Platelets 12/01/2023 366  150 - 400 K/uL Final   nRBC 12/01/2023 0.0  0.0 - 0.2 % Final   Neutrophils Relative % 12/01/2023 52  % Final   Neutro Abs 12/01/2023 5.3  1.7 - 7.7 K/uL Final   Lymphocytes Relative 12/01/2023 35  % Final   Lymphs Abs 12/01/2023 3.7  0.7 - 4.0 K/uL Final   Monocytes Relative 12/01/2023 5  % Final   Monocytes Absolute 12/01/2023 0.5  0.1 - 1.0 K/uL Final   Eosinophils Relative 12/01/2023 6  % Final   Eosinophils Absolute 12/01/2023 0.6 (H)  0.0 - 0.5 K/uL Final   Basophils Relative 12/01/2023 1  % Final   Basophils Absolute 12/01/2023 0.1  0.0 - 0.1 K/uL Final   Immature Granulocytes 12/01/2023 1  % Final   Abs Immature Granulocytes 12/01/2023 0.08 (H)  0.00 - 0.07 K/uL Final   Performed at [facility], [address] [place], [place] [address]   Sodium 12/01/2023 132 (L)  135 - 145 mmol/L Final   Potassium 12/01/2023 3.8  3.5 - 5.1 mmol/L Final   Chloride 12/01/2023 95 (L)  98 - 111 mmol/L Final   CO2 12/01/2023 26  22 - 32 mmol/L Final   Glucose, Bld 12/01/2023 101 (H)  70 - 99 mg/dL Final   Glucose reference range applies only to samples taken after fasting for at least 8 hours.   BUN 12/01/2023 16  6 - 20 mg/dL Final   Creatinine, Ser 12/01/2023 0.81  0.44 - 1.00 mg/dL Final   Calcium 89/74/7974 9.0  8.9 - 10.3 mg/dL Final   Total Protein 89/74/7974 6.5  6.5 - 8.1 g/dL Final   Albumin 89/74/7974 3.5  3.5 - 5.0 g/dL Final   AST 89/74/7974 13 (L)  15 - 41 U/L Final   ALT 12/01/2023 9  0 - 44 U/L Final   Alkaline Phosphatase 12/01/2023 75  38 - 126 U/L Final   Total Bilirubin 12/01/2023 0.4  0.0 - 1.2 mg/dL Final   GFR, Estimated 12/01/2023 >60  >60 mL/min Final   Comment: (NOTE) Calculated using the CKD-EPI Creatinine Equation (2021)    Anion gap 12/01/2023 11  5 - 15 Final   Performed at [facility], [address] [place], [place] [address]    Hgb A1c MFr Bld 12/01/2023 5.0  4.8 - 5.6 % Final   Comment: (NOTE) Diagnosis of Diabetes The following HbA1c ranges recommended by the American Diabetes Association (ADA) may be used as an aid in the diagnosis of diabetes mellitus.  Hemoglobin             Suggested A1C NGSP%              Diagnosis  <5.7                   Non Diabetic  5.7-6.4                Pre-Diabetic  >6.4                   Diabetic  <7.0                   Glycemic control for                       adults with diabetes.     Mean Plasma Glucose 12/01/2023 96.8  mg/dL Final   Performed at [facility], [address], [place], [place] [address]   Cholesterol 12/01/2023 212 (H)  0 - 200 mg/dL Final   Triglycerides 89/74/7974 150 (H)  <150 mg/dL Final   HDL 89/74/7974 58  >40 mg/dL Final   Total CHOL/HDL Ratio 12/01/2023 3.7  RATIO Final   VLDL 12/01/2023 30  0 - 40 mg/dL Final   LDL Cholesterol 12/01/2023 124 (H)  0 - 99 mg/dL Final   Comment:        Total Cholesterol/HDL:CHD Risk Coronary Heart Disease Risk Table                     Men   Women  1/2 Average Risk   3.4   3.3  Average Risk       5.0   4.4  2 X Average Risk   9.6   7.1  3 X Average Risk  23.4   11.0        Use the calculated Patient Ratio above and the CHD Risk Table to determine the patient's CHD Risk.        ATP III CLASSIFICATION (LDL):  <100     mg/dL   Optimal  899-870  mg/dL   Near or Above                    Optimal  130-159  mg/dL   Borderline  839-810  mg/dL   High  >809     mg/dL   Very High Performed at [facility], [address] [place], [place] [address]    Alcohol, Ethyl (B) 12/01/2023 <15  <15 mg/dL Final   Comment: (NOTE) For medical purposes only. Performed at [facility], [address] [place], [place] [address]    TSH 12/01/2023 2.685  0.350 - 4.500 uIU/mL Final   Comment: Performed by a 3rd Generation assay with a functional sensitivity of <=0.01  uIU/mL. Performed at [facility], [address] [place], [place] [address]    POC Amphetamine  UR 12/01/2023 Positive (A)  NONE DETECTED (Cut Off Level 1000 ng/mL) Final   POC Secobarbital (BAR) 12/01/2023 None Detected  NONE DETECTED (Cut Off Level 300 ng/mL) Final   POC Buprenorphine (BUP) 12/01/2023 None Detected  NONE DETECTED (Cut Off Level 10 ng/mL) Final   POC Oxazepam (BZO) 12/01/2023 Positive (A)  NONE DETECTED (Cut Off Level 300 ng/mL) Final   POC Cocaine UR 12/01/2023 None Detected  NONE DETECTED (Cut Off Level 300 ng/mL) Final   POC Methamphetamine UR 12/01/2023 Positive (A)  NONE DETECTED (Cut Off Level 1000 ng/mL) Final   POC Morphine 12/01/2023 None Detected  NONE DETECTED (Cut Off Level 300 ng/mL) Final   POC Methadone UR 12/01/2023 None Detected  NONE DETECTED (Cut Off Level 300 ng/mL) Final   POC Oxycodone  UR 12/01/2023 None Detected  NONE DETECTED (Cut Off Level 100 ng/mL) Final   POC Marijuana UR 12/01/2023 None Detected  NONE DETECTED (Cut Off Level 50 ng/mL) Final    Blood Alcohol level:  Lab Results  Component Value Date   [facility] <15 12/01/2023   ETH <10 03/17/2022    Metabolic Disorder Labs: Lab Results  Component Value Date   HGBA1C 5.0 12/01/2023   MPG 96.8 12/01/2023   MPG 108 07/06/2016   Lab Results  Component Value Date   PROLACTIN 64.2 (H) 07/06/2016   PROLACTIN 102.2 (H) 12/19/2014   Lab Results  Component Value Date   CHOL 212 (H) 12/01/2023   TRIG 150 (H) 12/01/2023   HDL 58 12/01/2023   CHOLHDL 3.7 12/01/2023   VLDL 30 12/01/2023   LDLCALC 124 (H) 12/01/2023   LDLCALC 88 07/06/2016    Therapeutic Lab Levels: Lab Results  Component Value Date   LITHIUM  0.23 (L) 07/04/2016   LITHIUM  0.34 (L) 07/01/2016   No results found for: VALPROATE No results found for: CBMZ  Physical Findings   AIMS    Flowsheet Row Ad[facility]Dis[facility]) [facility]  AIMS Total Score 0   AUDIT    Flowsheet  Row Admission (Discharged) [facility] Admission (Dis[facility] Ad[facility] (Discharged[facility]  Alcohol Use Disorder Identification Test Final Score (AUDIT) 0 3 1   PHQ2-9    Flowsheet Row ED from 11/29/2023 in [facility]  PHQ-2 Total Score 1   Flowsheet Row ED from 11/29/2023 in [facility] Most recent reading at 11/29/2023  6:19 PM ED from 11/29/2023 in [facility] Most recent reading at 11/29/2023 12:53 AM ED from 03/17/2022 in [facility] Emergency Department at [facility] Most recent reading at 03/17/2022  2:07 AM  C-SSRS RISK CATEGORY High Risk High Risk No Risk     Musculoskeletal  Strength & Muscle Tone: within normal limits Gait & Station: normal Patient leans: N/A  Psychiatric Specialty Exam  Presentation  General Appearance:  Casual  Eye Contact: Fair  Speech: Clear and Coherent  Speech Volume: Normal  Handedness: Right   Mood and Affect  Mood: Anxious; Depressed  Affect: Congruent   Thought Process  Thought Processes: Coherent  Descriptions of Associations:Intact  Orientation:Full (Time, Place and Person)  Thought Content:Logical  Diagnosis of Schizophrenia or Schizoaffective disorder  in past: No  Duration of Psychotic Symptoms: Greater than six months   Hallucinations:Hallucinations: None Description of Auditory Hallucinations: voices saying mean things. Sometimes solitary, sometimes a crowd  Ideas of Reference:None  Suicidal Thoughts:Suicidal Thoughts: No  Homicidal Thoughts:Homicidal Thoughts: No   Sensorium  Memory: Immediate Fair  Judgment: Fair  Insight: Fair   Chartered Certified Accountant: Fair  Attention  Span: Fair  Recall: [employer] of Knowledge: Fair  Language: Fair   Psychomotor Activity  Psychomotor Activity: Psychomotor Activity: Normal  Assets  Assets: Resilience  Sleep  Sleep: Sleep: Fair  Estimated Sleeping Duration (Last 24 Hours): 10.75-13.25 hours  No data recorded   Physical Exam  Physical Exam Vitals and nursing note reviewed.  Eyes:     Pupils: Pupils are equal, round, and reactive to light.  Neurological:     General: No focal deficit present.     Mental Status: She is alert and oriented to person, place, and time.    Review of Systems  Psychiatric/Behavioral:  Positive for depression and substance abuse. Negative for hallucinations, memory loss and suicidal ideas. The patient is nervous/anxious and has insomnia.    Blood pressure (!) 130/94, pulse (!) 120, temperature 98.1 F (36.7 C), resp. rate 17, SpO2 98%. There is no height or weight on file to calculate BMI.  Treatment Plan Summary: -Continue gabapentin 600 mg at bedtime - Continue Ativan  1 mg as needed every 6 hours for CIWA scores over 10 for alcohol use disorder - Continue trazodone  50 mg nightly for sleep - Continue Tylenol /milk of mag/Maalox as needed as per the [facility] - Continue agitation protocol medications as per the [facility]  Donia Snell, NP 12/01/2023 5:50 PM

## 2023-12-01 NOTE — ED Notes (Signed)
Patient asleep at this time. NAD.

## 2023-12-02 DIAGNOSIS — F1524 Other stimulant dependence with stimulant-induced mood disorder: Secondary | ICD-10-CM | POA: Diagnosis not present

## 2023-12-02 DIAGNOSIS — F17210 Nicotine dependence, cigarettes, uncomplicated: Secondary | ICD-10-CM | POA: Diagnosis not present

## 2023-12-02 DIAGNOSIS — F29 Unspecified psychosis not due to a substance or known physiological condition: Secondary | ICD-10-CM | POA: Diagnosis not present

## 2023-12-02 DIAGNOSIS — Z79899 Other long term (current) drug therapy: Secondary | ICD-10-CM | POA: Diagnosis not present

## 2023-12-02 LAB — BASIC METABOLIC PANEL WITH GFR
Anion gap: 9 (ref 5–15)
BUN: 16 mg/dL (ref 6–20)
CO2: 25 mmol/L (ref 22–32)
Calcium: 8.8 mg/dL — ABNORMAL LOW (ref 8.9–10.3)
Chloride: 102 mmol/L (ref 98–111)
Creatinine, Ser: 1.09 mg/dL — ABNORMAL HIGH (ref 0.44–1.00)
GFR, Estimated: >60 mL/min (ref >60)
Glucose, Bld: 85 mg/dL (ref 70–99)
Potassium: 4.3 mmol/L (ref 3.5–5.1)
Sodium: 136 mmol/L (ref 135–145)

## 2023-12-02 NOTE — ED Notes (Signed)
 Paitent provided lunch.
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
## 2023-12-02 NOTE — ED Notes (Signed)
 Paitent provided dinner.

## 2023-12-02 NOTE — ED Notes (Signed)
 Pt reports to feel 'fine' and 'tired' . Pt affirms to have adequately slept last night. Pt ate breakfast, c/o level 8/10 tooth pain- prn tylenol  given. Si hi and avh denied- verbal contract for safety provided. Last BM Friday, pt denied constipation declined MOM. Medications reviewed, questions denied.

## 2023-12-02 NOTE — ED Provider Notes (Signed)
 Behavioral Health Progress Note  Date and Time: 12/02/2023 4:20 PM Name: MARLOW HENDRIE MRN:  [id number]  HPI: Per admissions assessment: Bevan Vu is a 43 year old female with a past psychiatric history of substance induced psychosis and stimulant use disorder methamphetamine type who was prescribed Haldol  5 mg daily, gabapentin 1200 mg TID, D amphetamine  salt 30 mg BID, and Valium  5 mg TID PRN managed by Dr. Emilio Aurora.  She presents to [facility] due to wanting to detox from methamphetamine and ultimately go to a residential rehabilitation facility for her substance use.  She states that prior to coming to [facility], she called [facility] and they told her to come in detox in this facility.  She states that she is motivated to detox from methamphetamine due to the ongoing auditory hallucinations that she is experiencing along with being on probation.  Pt admitted to [facility] on 12/01/23 for methamphetamine use and auditory hallucinations. Upon admission, UDS positive for amphetamines, methamphetamines and benzodiazepines. BAL was negative.  Subjective:    Upon assessment patient is asleep in bed and easily aroused, in no distress.  She reports sleeping well overnight however still feels tired throughout the day.  Reports having a good appetite.  Patient denies having any suicidal ideations, homicidal ideations and states that the auditory hallucinations have resolved.  Patient also denies visual hallucinations and paranoia.  Patient has been compliant with medications and denies having any concerns or side effects from the psychiatric medications.  Patient denies having cravings at this time.  Discussed with patient the plan to go to a rehabilitation facility at discharge and will follow up with social work for referral updates tomorrow.   Labs reviewed: CMP was redrawn and Sodium is now WNL at 136.  Cholesterol levels elevated.  UDS positive for amphetamines, methamphetamines and benzodiazepines. BAL was negative.  Remaining labs unremarkable.   Diagnosis:  Final diagnoses:  Substance induced mood disorder (HCC)  Polysubstance dependence (HCC)  Stimulant use disorder (methamphetamine)   Admission HPI: Omah Dewalt is a 43 year old female with a past psychiatric history of substance induced psychosis and stimulant use disorder methamphetamine type who was prescribed Haldol  5 mg daily, gabapentin 1200 mg TID, D amphetamine  salt 30 mg BID, and Valium  5 mg TID PRN managed by Dr. Emilio Aurora.  She presents to [facility] due to wanting to detox from methamphetamine and ultimately go to a residential rehabilitation facility for her substance use.  She states that prior to coming to [facility], she called [facility] and they told her to come in detox in this facility.  She states that she is motivated to detox from methamphetamine due to the ongoing auditory hallucinations that she is experiencing along with being on probation.  She states that her current medication regimen is partially helpful for her auditory hallucinations.   She reports smoking methamphetamines off-and-on for years but recently has increased frequency to daily with most recent use being this morning.  She reports smoking cigarettes, about 1 pack every 2 days.  She denies alcohol, marijuana, and other illicit substance use at this time.  She denies SI, HI, and visual hallucinations.  She reports having auditory hallucinations at times telling her that they will come get her.   She reports taking Adderall for most days, most recently taking 3 days ago.  She states that she takes Haldol  daily and Valium  about 5 times a week.  She states that she takes gabapentin at night.  She states that she sees Dr. Aurora, most recently 1  month ago.   She currently lives with her cousin.  She is unemployed, on SSI.  She is single with 3 children ages 76 (currently living with patient's mother), 37, and 66.  She is currently on probation and denies any upcoming court dates.  She  denies access to a firearm.      Sleep: Good  Appetite:  Good  Current Medications:  Current Facility-Administered Medications  Medication Dose Route Frequency Provider Last Rate Last Admin   acetaminophen  (TYLENOL ) tablet 650 mg  650 mg Oral Q6H PRN Hoang, Daniela B, MD   650 mg at 12/02/23 0925   alum & mag hydroxide-simeth (MAALOX/MYLANTA) 200-200-20 MG/5ML suspension 30 mL  30 mL Oral Q4H PRN Hoang, Daniela B, MD       benzocaine (ORAJEL) 10 % mucosal gel 1 Application  1 Application Mouth/Throat TID PRN Ajibola, Ene A, NP   1 Application at 12/01/23 2018   haloperidol  (HALDOL ) tablet 5 mg  5 mg Oral TID PRN Hoang, Daniela B, MD       And   diphenhydrAMINE  (BENADRYL ) capsule 50 mg  50 mg Oral TID PRN Hoang, Daniela B, MD       haloperidol  lactate (HALDOL ) injection 5 mg  5 mg Intramuscular TID PRN Hoang, Daniela B, MD       And   diphenhydrAMINE  (BENADRYL ) injection 50 mg  50 mg Intramuscular TID PRN Hoang, Daniela B, MD       And   LORazepam  (ATIVAN ) injection 2 mg  2 mg Intramuscular TID PRN Hoang, Daniela B, MD       haloperidol  lactate (HALDOL ) injection 10 mg  10 mg Intramuscular TID PRN Hoang, Daniela B, MD       And   diphenhydrAMINE  (BENADRYL ) injection 50 mg  50 mg Intramuscular TID PRN Hoang, Daniela B, MD       And   LORazepam  (ATIVAN ) injection 2 mg  2 mg Intramuscular TID PRN Hoang, Daniela B, MD       gabapentin (NEURONTIN) capsule 600 mg  600 mg Oral QHS Hoang, Daniela B, MD   600 mg at 12/01/23 2105   haloperidol  (HALDOL ) tablet 5 mg  5 mg Oral QHS Hoang, Daniela B, MD   5 mg at 12/01/23 2104   hydrOXYzine  (ATARAX ) tablet 25 mg  25 mg Oral TID PRN Hoang, Daniela B, MD       loperamide  (IMODIUM ) capsule 2-4 mg  2-4 mg Oral PRN Hoang, Daniela B, MD       LORazepam  (ATIVAN ) tablet 1 mg  1 mg Oral Q6H PRN Hoang, Daniela B, MD       magnesium  hydroxide (MILK OF MAGNESIA) suspension 30 mL  30 mL Oral Daily PRN Hoang, Daniela B, MD       ondansetron  (ZOFRAN -ODT)  disintegrating tablet 4 mg  4 mg Oral Q6H PRN Hoang, Daniela B, MD       traZODone  (DESYREL ) tablet 50 mg  50 mg Oral QHS PRN Hoang, Daniela B, MD   50 mg at 11/30/23 2109   Current Outpatient Medications  Medication Sig Dispense Refill   amphetamine -dextroamphetamine  (ADDERALL) 30 MG tablet Take 30 mg by mouth 2 (two) times daily.     diazepam  (VALIUM ) 5 MG tablet Take 5 mg by mouth 3 (three) times daily as needed for anxiety.     gabapentin (NEURONTIN) 600 MG tablet Take 1,200 mg by mouth at bedtime.     haloperidol  (HALDOL ) 5 MG tablet Take 5-10 mg by mouth  2 (two) times daily. Take two tablets in the morning and one tablet at 12 noon      Labs  Lab Results:  Admission on 11/29/2023  Component Date Value Ref Range Status   WBC 12/01/2023 10.4  4.0 - 10.5 K/uL Final   RBC 12/01/2023 4.75  3.87 - 5.11 MIL/uL Final   Hemoglobin 12/01/2023 13.8  12.0 - 15.0 g/dL Final   HCT 89/74/7974 42.3  36.0 - 46.0 % Final   MCV 12/01/2023 89.1  80.0 - 100.0 fL Final   MCH 12/01/2023 29.1  26.0 - 34.0 pg Final   MCHC 12/01/2023 32.6  30.0 - 36.0 g/dL Final   RDW 89/74/7974 12.3  11.5 - 15.5 % Final   Platelets 12/01/2023 366  150 - 400 K/uL Final   nRBC 12/01/2023 0.0  0.0 - 0.2 % Final   Neutrophils Relative % 12/01/2023 52  % Final   Neutro Abs 12/01/2023 5.3  1.7 - 7.7 K/uL Final   Lymphocytes Relative 12/01/2023 35  % Final   Lymphs Abs 12/01/2023 3.7  0.7 - 4.0 K/uL Final   Monocytes Relative 12/01/2023 5  % Final   Monocytes Absolute 12/01/2023 0.5  0.1 - 1.0 K/uL Final   Eosinophils Relative 12/01/2023 6  % Final   Eosinophils Absolute 12/01/2023 0.6 (H)  0.0 - 0.5 K/uL Final   Basophils Relative 12/01/2023 1  % Final   Basophils Absolute 12/01/2023 0.1  0.0 - 0.1 K/uL Final   Immature Granulocytes 12/01/2023 1  % Final   Abs Immature Granulocytes 12/01/2023 0.08 (H)  0.00 - 0.07 K/uL Final   Performed at [facility], [address], [place], [place] [address]   Sodium  12/01/2023 132 (L)  135 - 145 mmol/L Final   Potassium 12/01/2023 3.8  3.5 - 5.1 mmol/L Final   Chloride 12/01/2023 95 (L)  98 - 111 mmol/L Final   CO2 12/01/2023 26  22 - 32 mmol/L Final   Glucose, Bld 12/01/2023 101 (H)  70 - 99 mg/dL Final   Glucose reference range applies only to samples taken after fasting for at least 8 hours.   BUN 12/01/2023 16  6 - 20 mg/dL Final   Creatinine, Ser 12/01/2023 0.81  0.44 - 1.00 mg/dL Final   Calcium 89/74/7974 9.0  8.9 - 10.3 mg/dL Final   Total Protein 89/74/7974 6.5  6.5 - 8.1 g/dL Final   Albumin 89/74/7974 3.5  3.5 - 5.0 g/dL Final   AST 89/74/7974 13 (L)  15 - 41 U/L Final   ALT 12/01/2023 9  0 - 44 U/L Final   Alkaline Phosphatase 12/01/2023 75  38 - 126 U/L Final   Total Bilirubin 12/01/2023 0.4  0.0 - 1.2 mg/dL Final   GFR, Estimated 12/01/2023 >60  >60 mL/min Final   Comment: (NOTE) Calculated using the CKD-EPI Creatinine Equation (2021)    Anion gap 12/01/2023 11  5 - 15 Final   Performed at [facility], [address], [place], [place] [address]   Hgb A1c MFr Bld 12/01/2023 5.0  4.8 - 5.6 % Final   Comment: (NOTE) Diagnosis of Diabetes The following HbA1c ranges recommended by the American Diabetes Association (ADA) may be used as an aid in the diagnosis of diabetes mellitus.  Hemoglobin             Suggested A1C NGSP%              Diagnosis  <5.7  Non Diabetic  5.7-6.4                Pre-Diabetic  >6.4                   Diabetic  <7.0                   Glycemic control for                       adults with diabetes.     Mean Plasma Glucose 12/01/2023 96.8  mg/dL Final   Performed at [facility], [address], [place], [place] [address]   Cholesterol 12/01/2023 212 (H)  0 - 200 mg/dL Final   Triglycerides 89/74/7974 150 (H)  <150 mg/dL Final   HDL 89/74/7974 58  >40 mg/dL Final   Total CHOL/HDL Ratio 12/01/2023 3.7  RATIO Final   VLDL 12/01/2023 30  0 - 40 mg/dL Final   LDL Cholesterol  12/01/2023 124 (H)  0 - 99 mg/dL Final   Comment:        Total Cholesterol/HDL:CHD Risk Coronary Heart Disease Risk Table                     Men   Women  1/2 Average Risk   3.4   3.3  Average Risk       5.0   4.4  2 X Average Risk   9.6   7.1  3 X Average Risk  23.4   11.0        Use the calculated Patient Ratio above and the CHD Risk Table to determine the patient's CHD Risk.        ATP III CLASSIFICATION (LDL):  <100     mg/dL   Optimal  899-870  mg/dL   Near or Above                    Optimal  130-159  mg/dL   Borderline  839-810  mg/dL   High  >809     mg/dL   Very High Performed at [facility], [address], [place], [place] [address]    Alcohol, Ethyl (B) 12/01/2023 <15  <15 mg/dL Final   Comment: (NOTE) For medical purposes only. Performed at [facility], [address] [place], [place] [address]    TSH 12/01/2023 2.685  0.350 - 4.500 uIU/mL Final   Comment: Performed by a 3rd Generation assay with a functional sensitivity of <=0.01 uIU/mL. Performed at [facility], [address] [place], [place] [address]    POC Amphetamine  UR 12/01/2023 Positive (A)  NONE DETECTED (Cut Off Level 1000 ng/mL) Final   POC Secobarbital (BAR) 12/01/2023 None Detected  NONE DETECTED (Cut Off Level 300 ng/mL) Final   POC Buprenorphine (BUP) 12/01/2023 None Detected  NONE DETECTED (Cut Off Level 10 ng/mL) Final   POC Oxazepam (BZO) 12/01/2023 Positive (A)  NONE DETECTED (Cut Off Level 300 ng/mL) Final   POC Cocaine UR 12/01/2023 None Detected  NONE DETECTED (Cut Off Level 300 ng/mL) Final   POC Methamphetamine UR 12/01/2023 Positive (A)  NONE DETECTED (Cut Off Level 1000 ng/mL) Final   POC Morphine 12/01/2023 None Detected  NONE DETECTED (Cut Off Level 300 ng/mL) Final   POC Methadone UR 12/01/2023 None Detected  NONE DETECTED (Cut Off Level 300 ng/mL) Final   POC Oxycodone  UR 12/01/2023 None Detected  NONE  DETECTED (Cut Off Level 100 ng/mL) Final   POC Marijuana  UR 12/01/2023 None Detected  NONE DETECTED (Cut Off Level 50 ng/mL) Final   Sodium 12/02/2023 136  135 - 145 mmol/L Final   Potassium 12/02/2023 4.3  3.5 - 5.1 mmol/L Final   Chloride 12/02/2023 102  98 - 111 mmol/L Final   CO2 12/02/2023 25  22 - 32 mmol/L Final   Glucose, Bld 12/02/2023 85  70 - 99 mg/dL Final   Glucose reference range applies only to samples taken after fasting for at least 8 hours.   BUN 12/02/2023 16  6 - 20 mg/dL Final   Creatinine, Ser 12/02/2023 1.09 (H)  0.44 - 1.00 mg/dL Final   Calcium 89/73/7974 8.8 (L)  8.9 - 10.3 mg/dL Final   GFR, Estimated 12/02/2023 >60  >60 mL/min Final   Comment: (NOTE) Calculated using the CKD-EPI Creatinine Equation (2021)    Anion gap 12/02/2023 9  5 - 15 Final   Performed at [facility], [address] [place], [place] [address]    Blood Alcohol level:  Lab Results  Component Value Date   [facility] <15 12/01/2023   ETH <10 03/17/2022    Metabolic Disorder Labs: Lab Results  Component Value Date   HGBA1C 5.0 12/01/2023   MPG 96.8 12/01/2023   MPG 108 07/06/2016   Lab Results  Component Value Date   PROLACTIN 64.2 (H) 07/06/2016   PROLACTIN 102.2 (H) 12/19/2014   Lab Results  Component Value Date   CHOL 212 (H) 12/01/2023   TRIG 150 (H) 12/01/2023   HDL 58 12/01/2023   CHOLHDL 3.7 12/01/2023   VLDL 30 12/01/2023   LDLCALC 124 (H) 12/01/2023   LDLCALC 88 07/06/2016    Therapeutic Lab Levels: Lab Results  Component Value Date   LITHIUM  0.23 (L) 07/04/2016   LITHIUM  0.34 (L) 07/01/2016   No results found for: VALPROATE No results found for: CBMZ  Physical Findings   AIMS    Flowsheet Row [facility]) [facility]  AIMS Total Score 0   AUDIT    Flowsheet Row Admission (Discharged) [facility]  Alcohol Use Disorder Identification Test Final Score (AUDIT) 0 3 1   PHQ2-9    Flowsheet Row ED from 11/29/2023 in [facility]  PHQ-2 Total Score 1   Flowsheet Row ED from 11/29/2023 in [facility] Most recent reading at 11/29/2023  6:19 PM ED from 11/29/2023 in [facility] Most recent reading at 11/29/2023 12:53 AM ED from 03/17/2022 in [facility] at [facility] Most recent reading at 03/17/2022  2:07 AM  C-SSRS RISK CATEGORY High Risk High Risk No Risk     Musculoskeletal  Strength & Muscle Tone: within normal limits Gait & Station: normal Patient leans: N/A  Psychiatric Specialty Exam  Presentation  General Appearance:  Casual  Eye Contact: Fair  Speech: Clear and Coherent  Speech Volume: Normal  Handedness: Right   Mood and Affect  Mood: Dysphoric  Affect: Congruent   Thought Process  Thought Processes: Coherent  Descriptions of Associations:Intact  Orientation:Full (Time, Place and Person)  Thought Content:Logical  Diagnosis of Schizophrenia or Schizoaffective disorder in past: No  Duration of Psychotic Symptoms: Greater than six months   Hallucinations:Hallucinations: None  Ideas of Reference:None  Suicidal Thoughts:Suicidal Thoughts: No  Homicidal Thoughts:Homicidal Thoughts: No   Sensorium  Memory: Immediate Fair  Judgment: Fair  Insight: Fair   Art Therapist  Concentration: Fair  Attention Span: Fair  Recall: Fair  Fund of Knowledge: Fair  Language: Fair   Psychomotor Activity  Psychomotor Activity: Psychomotor Activity: Normal  Assets  Assets: Resilience  Sleep  Sleep: Sleep: Good   Physical Exam  Physical Exam Vitals and nursing note reviewed.  Eyes:     Pupils: Pupils  are equal, round, and reactive to light.  Cardiovascular:     Rate and Rhythm: Tachycardia present.  Pulmonary:     Effort: Pulmonary effort is normal.  Musculoskeletal:        General: Normal range of motion.  Neurological:     General: No focal deficit present.     Mental Status: She is alert and oriented to person, place, and time.    Review of Systems  Constitutional: Negative.   HENT: Negative.    Eyes: Negative.   Respiratory: Negative.    Cardiovascular: Negative.   Gastrointestinal: Negative.   Genitourinary: Negative.   Musculoskeletal: Negative.   Neurological: Negative.   Endo/Heme/Allergies: Negative.   Psychiatric/Behavioral:  Positive for depression and substance abuse. Negative for hallucinations, memory loss and suicidal ideas.    Blood pressure 119/88, pulse (!) 108, temperature 97.6 F (36.4 C), temperature source Oral, resp. rate 14, SpO2 98%. There is no height or weight on file to calculate BMI.  Treatment Plan Summary:  Medications:  No medication changes today -Continue gabapentin 600 mg at bedtime - Continue Ativan  1 mg as needed every 6 hours for CIWA scores over 10 for alcohol use disorder - Continue trazodone  50 mg nightly for sleep - Continue Tylenol /milk of mag/Maalox as needed as per the [facility] - Continue agitation protocol medications as per the [facility]   Discharge planning:  - Patient was initially interested in residential treatment after discharging from [facility] however, per nursing patient is now requesting to be discharged tomorrow and signed a 72-hour request for discharge this evening.   Alan JAYSON Mcardle, NP 12/02/2023 4:20 PM

## 2023-12-02 NOTE — Progress Notes (Signed)
 Patient is alert and oriented x 4 with no acute distress noted.  Patient denies SI/HI/AVH.  Patient is calm and cooperative, and been mostly withdrawn in her room.  Patient has signed a 72 hour form early during the day, and has verbalized wanting to be discharged.  Patient remains firm on wanting to be discharge.  No issues or concerns voiced at this time. Will continue with Q 15 min safety checks.

## 2023-12-02 NOTE — ED Notes (Signed)
 Pt ate lunch. RN unsuccessful in obtaining lab work, provider notified. Pt denies concerns or complaints, no distress observed.

## 2023-12-02 NOTE — ED Notes (Signed)
 Paitent provided breakfast.

## 2023-12-02 NOTE — Progress Notes (Signed)
 Patient is resting with eyes closed at this time and no acute distress noted.  Respirations present and unlabored.  No issues currently at this time.

## 2023-12-03 DIAGNOSIS — F17210 Nicotine dependence, cigarettes, uncomplicated: Secondary | ICD-10-CM | POA: Diagnosis not present

## 2023-12-03 DIAGNOSIS — Z79899 Other long term (current) drug therapy: Secondary | ICD-10-CM | POA: Diagnosis not present

## 2023-12-03 DIAGNOSIS — F29 Unspecified psychosis not due to a substance or known physiological condition: Secondary | ICD-10-CM | POA: Diagnosis not present

## 2023-12-03 DIAGNOSIS — F1524 Other stimulant dependence with stimulant-induced mood disorder: Secondary | ICD-10-CM | POA: Diagnosis not present

## 2023-12-03 MED ORDER — HALOPERIDOL 5 MG PO TABS: 5.0000 mg | ORAL_TABLET | Freq: Every day | ORAL | 0 refills | Status: AC

## 2023-12-03 NOTE — ED Notes (Signed)
 Pt calm, statesshe's ready to go denies si and hi and avh, contracts for safety. Meds administered refer to [facility].

## 2023-12-03 NOTE — ED Notes (Signed)
 Pt was in a AA group.

## 2023-12-03 NOTE — Progress Notes (Signed)
 Patient is currently resting with eyes closed at this time.  No acute distress noted.  Respirations present and unlabored.  No current issues noted at this time.

## 2023-12-03 NOTE — Progress Notes (Signed)
 Patient resting with eyes closed at this time.  No acute distress noted.  Respirations present and unlabored.  No issues noted at this time.

## 2023-12-03 NOTE — ED Provider Notes (Addendum)
 [facility] Discharge Summary  Date and Time: 12/03/2023 12:58 PM Name: Leslie [name] MRN:  [id number]   HPI: Per admissions assessment: Leslie [name] is a 43 year old female with a past psychiatric history of substance induced psychosis and stimulant use disorder methamphetamine type who was prescribed Haldol  5 mg daily, gabapentin 1200 mg TID, D amphetamine  salt 30 mg BID, and Valium  5 mg TID PRN managed by Dr. Emilio [place].  She presents to [facility] due to wanting to detox from methamphetamine and ultimately go to a residential rehabilitation facility for her substance use.  She states that prior to coming to [facility], she called [facility] and they told her to come in detox in this facility.  She states that she is motivated to detox from methamphetamine due to the ongoing auditory hallucinations that she is experiencing along with being on probation.  Pt admitted to [facility] on 12/01/23 for methamphetamine use and auditory hallucinations. Upon admission, UDS positive for amphetamines, methamphetamines and benzodiazepines. BAL was negative.    Patient is seen prior to discharge on 12/03/2023, with assessment as follows:  Pt was resting in bed on approach. Pt reports that she feels ok, and that she sleep good last night. Pt reports that she wants to go to [facility] and states that she will call [facility] from [facility]. Pt states that she wants to go home then go to [facility], because she has a court case regarding child support and her child's father. Pt reports that the court date is next month, in November. Pt reports that she lives with her cousin, and that she will maintain her sobriety by staying away from those people. Pt Denies SI/HI, plan and intent. She also denies AVH, psychosis, and withdrawal symptoms. Pt has no complaints of medication side effects. Pt reports that she will continue her outpatient medication management with Dr. [place] and call to schedule appointments and follow-ups.  Medications continued at time of discharge  were the Haldol  5 mg nightly for psychosis and mood stabilization. Script for the Haldol  was sent to the pharmacy electronically while pt was educated to continue taking the gabapentin which she states she still has 2 months worth at home. Pt denies any plans or intent to harm self or others prior to discharge. Education provided as follows: Get help right away if: You have thoughts about hurting yourself or others. Get help right away if you feel like you may hurt yourself or others, or have thoughts about taking your own life. Go to your nearest emergency room or: Call 911. Call the National Suicide Prevention Lifeline at 843-087-2150 or 988 in the [place] This is open 24 hours a day. If you're a Veteran: Call 988 and press 1. This is open 24 hours a day. Text the [employer] at (564) 234-8210. Summary Mental health is not just the absence of mental illness. It involves understanding your emotions and behaviors, and taking steps to manage them in a healthy way. If you have symptoms of mental or emotional distress, get help from family, friends, a health care provider, or a mental health professional. Practice good mental health behaviors such as stress management skills, self-calming skills, exercise, healthy sleeping and eating, and supportive relationships. This information is not intended to replace advice given to you by your health care provider. Make sure you discuss any questions you have with your health care provider.  Education provided on the fact that if experiencing worsening of psychiatry symptoms including suicidal ideations, homicidal ideations, or having auditory/visual hallucinations, etc,  to call 911, 988, come back to this location, or go to the nearest ER. Pt verbalized understanding.   Above assessment was documented by A. Myra, and edited by Donia SAILOR, NP.    Diagnosis:  Final diagnoses:  Substance induced mood disorder (HCC)  Polysubstance dependence (HCC)    Total  Time spent with patient: 30 minutes  Past Psychiatric History: Substance induced psychosis, Stimulant use disorder methamphetamine type. Past Medical History: None reported Family History: None reported Family Psychiatric  History: None reported Social History: Pt is single with 3 children, 24, 17 and 76 yrs old. Her 13 year old lives with pt's mother. Pt is currently on probation and has a court case regarding child support next month, in November.    Sleep: Good  Appetite:  Fair  Current Medications:  Current Facility-Administered Medications  Medication Dose Route Frequency Provider Last Rate Last Admin   acetaminophen  (TYLENOL ) tablet 650 mg  650 mg Oral Q6H PRN Hoang, Daniela B, MD   650 mg at 12/02/23 0925   alum & mag hydroxide-simeth (MAALOX/MYLANTA) 200-200-20 MG/5ML suspension 30 mL  30 mL Oral Q4H PRN Hoang, Daniela B, MD       benzocaine (ORAJEL) 10 % mucosal gel 1 Application  1 Application Mouth/Throat TID PRN Ajibola, Ene A, NP   1 Application at 12/01/23 2018   haloperidol  (HALDOL ) tablet 5 mg  5 mg Oral TID PRN Hoang, Daniela B, MD       And   diphenhydrAMINE  (BENADRYL ) capsule 50 mg  50 mg Oral TID PRN Hoang, Daniela B, MD       haloperidol  lactate (HALDOL ) injection 5 mg  5 mg Intramuscular TID PRN Hoang, Daniela B, MD       And   diphenhydrAMINE  (BENADRYL ) injection 50 mg  50 mg Intramuscular TID PRN Hoang, Daniela B, MD       And   LORazepam  (ATIVAN ) injection 2 mg  2 mg Intramuscular TID PRN Hoang, Daniela B, MD       haloperidol  lactate (HALDOL ) injection 10 mg  10 mg Intramuscular TID PRN Hoang, Daniela B, MD       And   diphenhydrAMINE  (BENADRYL ) injection 50 mg  50 mg Intramuscular TID PRN Hoang, Daniela B, MD       And   LORazepam  (ATIVAN ) injection 2 mg  2 mg Intramuscular TID PRN Hoang, Daniela B, MD       gabapentin (NEURONTIN) capsule 600 mg  600 mg Oral QHS Hoang, Daniela B, MD   600 mg at 12/02/23 2102   haloperidol  (HALDOL ) tablet 5 mg  5 mg Oral QHS  Hoang, Daniela B, MD   5 mg at 12/02/23 2102   hydrOXYzine  (ATARAX ) tablet 25 mg  25 mg Oral TID PRN Hoang, Daniela B, MD   25 mg at 12/03/23 1105   magnesium  hydroxide (MILK OF MAGNESIA) suspension 30 mL  30 mL Oral Daily PRN Hoang, Daniela B, MD       traZODone  (DESYREL ) tablet 50 mg  50 mg Oral QHS PRN Hoang, Daniela B, MD   50 mg at 11/30/23 2109   Current Outpatient Medications  Medication Sig Dispense Refill   gabapentin (NEURONTIN) 600 MG tablet Take 1,200 mg by mouth at bedtime.     haloperidol  (HALDOL ) 5 MG tablet Take 1 tablet (5 mg total) by mouth at bedtime. 30 tablet 0    Labs  Lab Results:  Admission on 11/29/2023, Discharged on 12/03/2023  Component Date Value Ref Range  Status   WBC 12/01/2023 10.4  4.0 - 10.5 K/uL Final   RBC 12/01/2023 4.75  3.87 - 5.11 MIL/uL Final   Hemoglobin 12/01/2023 13.8  12.0 - 15.0 g/dL Final   HCT 89/74/7974 42.3  36.0 - 46.0 % Final   MCV 12/01/2023 89.1  80.0 - 100.0 fL Final   MCH 12/01/2023 29.1  26.0 - 34.0 pg Final   MCHC 12/01/2023 32.6  30.0 - 36.0 g/dL Final   RDW 89/74/7974 12.3  11.5 - 15.5 % Final   Platelets 12/01/2023 366  150 - 400 K/uL Final   nRBC 12/01/2023 0.0  0.0 - 0.2 % Final   Neutrophils Relative % 12/01/2023 52  % Final   Neutro Abs 12/01/2023 5.3  1.7 - 7.7 K/uL Final   Lymphocytes Relative 12/01/2023 35  % Final   Lymphs Abs 12/01/2023 3.7  0.7 - 4.0 K/uL Final   Monocytes Relative 12/01/2023 5  % Final   Monocytes Absolute 12/01/2023 0.5  0.1 - 1.0 K/uL Final   Eosinophils Relative 12/01/2023 6  % Final   Eosinophils Absolute 12/01/2023 0.6 (H)  0.0 - 0.5 K/uL Final   Basophils Relative 12/01/2023 1  % Final   Basophils Absolute 12/01/2023 0.1  0.0 - 0.1 K/uL Final   Immature Granulocytes 12/01/2023 1  % Final   Abs Immature Granulocytes 12/01/2023 0.08 (H)  0.00 - 0.07 K/uL Final   Performed at [facility], [address] [place], [place] [address]   Sodium 12/01/2023 132 (L)  135 - 145 mmol/L Final    Potassium 12/01/2023 3.8  3.5 - 5.1 mmol/L Final   Chloride 12/01/2023 95 (L)  98 - 111 mmol/L Final   CO2 12/01/2023 26  22 - 32 mmol/L Final   Glucose, Bld 12/01/2023 101 (H)  70 - 99 mg/dL Final   Glucose reference range applies only to samples taken after fasting for at least 8 hours.   BUN 12/01/2023 16  6 - 20 mg/dL Final   Creatinine, Ser 12/01/2023 0.81  0.44 - 1.00 mg/dL Final   Calcium 89/74/7974 9.0  8.9 - 10.3 mg/dL Final   Total Protein 89/74/7974 6.5  6.5 - 8.1 g/dL Final   Albumin 89/74/7974 3.5  3.5 - 5.0 g/dL Final   AST 89/74/7974 13 (L)  15 - 41 U/L Final   ALT 12/01/2023 9  0 - 44 U/L Final   Alkaline Phosphatase 12/01/2023 75  38 - 126 U/L Final   Total Bilirubin 12/01/2023 0.4  0.0 - 1.2 mg/dL Final   GFR, Estimated 12/01/2023 >60  >60 mL/min Final   Comment: (NOTE) Calculated using the CKD-EPI Creatinine Equation (2021)    Anion gap 12/01/2023 11  5 - 15 Final   Performed at [facility], [address] [place], [place] [address]   Hgb A1c MFr Bld 12/01/2023 5.0  4.8 - 5.6 % Final   Comment: (NOTE) Diagnosis of Diabetes The following HbA1c ranges recommended by the American Diabetes Association (ADA) may be used as an aid in the diagnosis of diabetes mellitus.  Hemoglobin             Suggested A1C NGSP%              Diagnosis  <5.7                   Non Diabetic  5.7-6.4                Pre-Diabetic  >  6.4                   Diabetic  <7.0                   Glycemic control for                       adults with diabetes.     Mean Plasma Glucose 12/01/2023 96.8  mg/dL Final   Performed at [facility], [address], [place], [place] [address]   Cholesterol 12/01/2023 212 (H)  0 - 200 mg/dL Final   Triglycerides 89/74/7974 150 (H)  <150 mg/dL Final   HDL 89/74/7974 58  >40 mg/dL Final   Total CHOL/HDL Ratio 12/01/2023 3.7  RATIO Final   VLDL 12/01/2023 30  0 - 40 mg/dL Final   LDL Cholesterol 12/01/2023 124 (H)  0 - 99 mg/dL Final    Comment:        Total Cholesterol/HDL:CHD Risk Coronary Heart Disease Risk Table                     Men   Women  1/2 Average Risk   3.4   3.3  Average Risk       5.0   4.4  2 X Average Risk   9.6   7.1  3 X Average Risk  23.4   11.0        Use the calculated Patient Ratio above and the CHD Risk Table to determine the patient's CHD Risk.        ATP III CLASSIFICATION (LDL):  <100     mg/dL   Optimal  899-870  mg/dL   Near or Above                    Optimal  130-159  mg/dL   Borderline  839-810  mg/dL   High  >809     mg/dL   Very High Performed at [facility], [address] [place], [place] [address]    Alcohol, Ethyl (B) 12/01/2023 <15  <15 mg/dL Final   Comment: (NOTE) For medical purposes only. Performed at [facility], [address], [place], [place] [address]    TSH 12/01/2023 2.685  0.350 - 4.500 uIU/mL Final   Comment: Performed by a 3rd Generation assay with a functional sensitivity of <=0.01 uIU/mL. Performed at [facility], [address], [place], [place] [address]    POC Amphetamine  UR 12/01/2023 Positive (A)  NONE DETECTED (Cut Off Level 1000 ng/mL) Final   POC Secobarbital (BAR) 12/01/2023 None Detected  NONE DETECTED (Cut Off Level 300 ng/mL) Final   POC Buprenorphine (BUP) 12/01/2023 None Detected  NONE DETECTED (Cut Off Level 10 ng/mL) Final   POC Oxazepam (BZO) 12/01/2023 Positive (A)  NONE DETECTED (Cut Off Level 300 ng/mL) Final   POC Cocaine UR 12/01/2023 None Detected  NONE DETECTED (Cut Off Level 300 ng/mL) Final   POC Methamphetamine UR 12/01/2023 Positive (A)  NONE DETECTED (Cut Off Level 1000 ng/mL) Final   POC Morphine 12/01/2023 None Detected  NONE DETECTED (Cut Off Level 300 ng/mL) Final   POC Methadone UR 12/01/2023 None Detected  NONE DETECTED (Cut Off Level 300 ng/mL) Final   POC Oxycodone  UR 12/01/2023 None Detected  NONE DETECTED (Cut Off Level 100 ng/mL) Final   POC Marijuana UR 12/01/2023 None Detected  NONE  DETECTED (Cut Off  Level 50 ng/mL) Final   Sodium 12/02/2023 136  135 - 145 mmol/L Final   Potassium 12/02/2023 4.3  3.5 - 5.1 mmol/L Final   Chloride 12/02/2023 102  98 - 111 mmol/L Final   CO2 12/02/2023 25  22 - 32 mmol/L Final   Glucose, Bld 12/02/2023 85  70 - 99 mg/dL Final   Glucose reference range applies only to samples taken after fasting for at least 8 hours.   BUN 12/02/2023 16  6 - 20 mg/dL Final   Creatinine, Ser 12/02/2023 1.09 (H)  0.44 - 1.00 mg/dL Final   Calcium 89/73/7974 8.8 (L)  8.9 - 10.3 mg/dL Final   GFR, Estimated 12/02/2023 >60  >60 mL/min Final   Comment: (NOTE) Calculated using the CKD-EPI Creatinine Equation (2021)    Anion gap 12/02/2023 9  5 - 15 Final   Performed at [facility], [address], [place], [place] [address]    Blood Alcohol level:  Lab Results  Component Value Date   [facility] <15 12/01/2023   ETH <10 03/17/2022    Metabolic Disorder Labs: Lab Results  Component Value Date   HGBA1C 5.0 12/01/2023   MPG 96.8 12/01/2023   MPG 108 07/06/2016   Lab Results  Component Value Date   PROLACTIN 64.2 (H) 07/06/2016   PROLACTIN 102.2 (H) 12/19/2014   Lab Results  Component Value Date   CHOL 212 (H) 12/01/2023   TRIG 150 (H) 12/01/2023   HDL 58 12/01/2023   CHOLHDL 3.7 12/01/2023   VLDL 30 12/01/2023   LDLCALC 124 (H) 12/01/2023   LDLCALC 88 07/06/2016    Therapeutic Lab Levels: Lab Results  Component Value Date   LITHIUM  0.23 (L) 07/04/2016   LITHIUM  0.34 (L) 07/01/2016   No results found for: VALPROATE No results found for: CBMZ  Physical Findings   AIMS    Flowsheet Row Ad[facility] (Discharged) [facility]  AIMS Total Score 0   AUDIT    Flowsheet Row Admission (Discharged) [facility] Admission (Discharged[facility] Admission  ([facility]charged) [facility]  Alcohol Use Disorder Identification Test Final Score (AUDIT) 0 3 1   PHQ2-9    Flowsheet Row ED from 11/29/2023 in [facility]  PHQ-2 Total Score 0   Flowsheet Row ED from 11/29/2023 in [facility] Most recent reading at 11/29/2023  6:19 PM ED from 11/29/2023 in [facility] Most recent reading at 11/29/2023 12:53 AM ED from 03/17/2022 in [facility] Emergency Department at [facility] Most recent reading at 03/17/2022  2:07 AM  C-SSRS RISK CATEGORY High Risk High Risk No Risk     Musculoskeletal  Strength & Muscle Tone: within normal limits Gait & Station: normal Patient leans: N/A  Psychiatric Specialty Exam  Presentation  General Appearance:  Casual  Eye Contact: Good  Speech: Clear and Coherent  Speech Volume: Normal  Handedness: Right   Mood and Affect  Mood: Depressed  Affect: Congruent   Thought Process  Thought Processes: Linear; Coherent  Descriptions of Associations:Intact  Orientation:Full (Time, Place and Person)  Thought Content:Logical  Diagnosis of Schizophrenia or Schizoaffective disorder in past: No  Duration of Psychotic Symptoms: N/A   Hallucinations:Hallucinations: None  Ideas of Reference:None  Suicidal Thoughts:Suicidal Thoughts: No  Homicidal  Thoughts:Homicidal Thoughts: No   Sensorium  Memory: Immediate Fair  Judgment: Fair  Insight: Fair   Chartered Certified Accountant: Fair  Attention Span: Fair  Recall: [employer] of Knowledge: Fair  Language: Fair   Psychomotor Activity  Psychomotor Activity:Psychomotor Activity: Normal   Assets  Assets: Manufacturing Systems Engineer; Resilience   Sleep  Sleep:Sleep: Fair  Estimated Sleeping Duration (Last 24 Hours): 13.25-14.50 hours  No data recorded  Physical Exam  Physical  Exam Vitals and nursing note reviewed.  Neurological:     Mental Status: She is alert and oriented to person, place, and time.     Gait: Gait is intact.  Psychiatric:        Attention and Perception: Attention and perception normal. She does not perceive auditory or visual hallucinations.        Mood and Affect: Mood and affect normal. Mood is not anxious or depressed.        Speech: Speech normal.        Behavior: Behavior normal. Behavior is cooperative.        Thought Content: Thought content is not paranoid or delusional. Thought content does not include homicidal or suicidal ideation. Thought content does not include homicidal or suicidal plan.        Cognition and Memory: Cognition and memory normal.        Judgment: Judgment normal.    Review of Systems  Psychiatric/Behavioral:  Positive for depression (stable for outpatient management) and substance abuse. Negative for hallucinations, memory loss and suicidal ideas. The patient is nervous/anxious (stable) and has insomnia (stable).   All other systems reviewed and are negative.  Blood pressure 116/85, pulse 93, temperature 97.7 F (36.5 C), temperature source Oral, resp. rate 18, SpO2 97%. There is no height or weight on file to calculate BMI.  Treatment Plan Summary: Continue outpatient medication management and follow-ups with Dr. Emilio [place]. Continue plan to go to residential rehabilitation after her court date in November.  Donia Snell, NP 12/03/2023 12:58 PM

## 2023-12-03 NOTE — ED Notes (Signed)
 At the time of discharge, the patient was alert and oriented 3,  No current suicidal or homicidal ideation, plan, or intent was reported. No signs of acute distress were noted. The patient was medically cleared for discharge.Patient was discharged to home. Discharge instructions were reviewed with patient Patient verbalized understanding of discharge instructions and was provided a copy.

## 2023-12-03 NOTE — Discharge Instructions (Addendum)
 [facility] Care Management  Upon Discharge...   Patient is to contact probation her officer  Officer Duwaine [phone]  Contact [facility] to complete phone interview for inpatient treatment [facility] [address] [place], [place] [address] [phone]  Community Resource Guide Outpatient Counseling/Substance Abuse Adult The United Way's "211" is a great source of information about community services available.  Access by dialing 2-1-1 from anywhere in [place] , or by website -  pooledincome.pl.   Other Local Resources (Updated 12/2015)  Crisis Hotlines   Services     Area Served  [employer] Crisis Hotline, available 24 hours a day, 7 days a week: 6361116802 [facility], [place]  [facility] Crisis Hotline, available 24 hours a day, 7 days a week: 726-119-2648 [facility], [place]  [facility] Suicide Prevention Hotline, available 24 hours a day, 7 days a week: (430) 342-8474 [facility], [place]  [employer], available 24 hours a day, 7 days a week: 805-342-5427 or ([phone] [facility], [place]   [facility] Access to [employer], available 24 hours a day, 7 days a week: 616-017-7106 All   [facility] Crisis Hotline, available 24 hours a day, 7 days a week: 864 381 6252 All   Other Local Resources (Updated 12/2015)  Outpatient Counseling/ Substance Abuse Programs  Services     Address and Phone Number  ADS ([facility] Drug Services)  Options include Individual counseling, group counseling, intensive outpatient program (several hours a day, several days a week) Offers depression assessments Provides methadone maintenance program [phone] [address], [address] [place], [place] [address]   [facility]  Offers partial hospitalization/day treatment and DUI/DWI programs [employer], private insurance [phone] [address]  [address] [place], [place] [address]  [facility] [place], [place], private insurance [phone] [address] [place], [place]  [facility]   Services include intensive outpatient program (several hours a day, several days a week), outpatient treatment, DUI/DWI services, family education Also has some services specifically for [employer] transitional housing  [phone] [address] [place], [place] [address]     [facility] Accepts Medicare, private pay, and private insurance [phone] [address] [place], [place] [address]  [employer] [facility] Services include individual counseling, substance abuse intensive outpatient program (several hours a day, several days a week), day treatment Florinda Many, Medicaid, private insurance [phone] [address], [address] [place], [place] [address]  [facility]  Offers substance abuse intensive outpatient program (several hours a day, several days a week), partial hospitalization program [phone] [address] [place], [place] [address]  [phone] [address] [place], [place] [address]  [phone] [address] [place], [place] [address]  [phone], [address] [place], [place] [address]  [facility] Individual counseling only Accepts private insurance only [phone] [address] [place], [place] [address]  [facility] Methadone maintenance program [phone] [address] [place], [place] [address]  [facility] providing substance abuse and mental health counseling Accepts Medicaid, Medicare, private insurance Offers sliding scale for uninsured [phone] [address] [place], [place]   [facility] [place], [employer]. Offers individual counseling, and intensive in-home services  [phone] [address], [address] [place], [place] [address]  [facility] individual counseling, family counseling, group therapy, domestic violence counseling, consumer credit counseling Accepts Medicare, Medicaid,  private insurance Offers sliding scale for uninsured [phone] [address] [place], [place] [address]  [phone] [facility], [address] [place], [place] [address]  [facility] Offers individual, family and group counseling 3 locations - [place], [place], and [place]  [phone]  [address]  [place], [place] [address]  [address] [place], [place] [address]  [address] [place], [place] [address]  [facility]   Offers psychiatric assessment, 8-week Intensive Outpatient Program (several hours a day, several times a week, daytime or evenings), early recovery group, family Program, medication management Private pay or private insurance only [phone], or  [phone] [address] [place], [place] [address]  [employer] individual, couples and family counseling Accepts Medicaid, private insurance, and sliding scale for uninsured [phone] [address] [place], [place] [address]  Alm Riding, MD Individual counseling Private insurance [phone] [address] [place], [place] [address]  [facility]  Offers assessment, substance abuse treatment, and behavioral health treatment [phone] [address] [place], [place] [address]  [facility] Individual counseling Accepts private insurance [phone] [address] [place], [place] [address]  [facility] Individual counseling Florinda Many, private insurance [phone] [address] [place], [place] [address]  [facility]   Serves children and adolescents ages 68 to 20 years of age Offers 90-day or longer intensive  outpatient programs (3 hours a day, 3 days a week) Cost is $5000/month. Facility is out of network with all insurance companies. Accepts private pay. Does not accept Medicaid.   Offers scholarships and financial assistance [phone] [employer] [place], [place]  [facility] Individual counseling Medicare, private insurance [phone] [address] [place], [place] [address]  [facility]   Offers intensive outpatient program (several hours a day, several times a week) and partial hospitalization program [phone] [address] [place], [place] [address]  Elodie Anon, MD Individual counseling [phone] [address] [place], [place] [address]  [facility] Offers Christian counseling to individuals, couples, and families Accepts Medicare and private insurance; offers sliding scale for uninsured [phone] [address] [place], [place] [address]  [facility] counseling [phone] [address] [place], [place] [address]  [facility] [employer] crisis counseling, individual counseling, group therapy, in-home therapy, domestic violence services, day treatment, DWI services, Administrator, Arts (CST), Doctor, Hospital ([facility]), substance abuse Intensive Outpatient Program (several hours a day, several times a week) 2 locations - [place] and [place] [phone] [address] [place], [place] [address]  [phone] [address] [place], [place] [address]  [facility]    Individual counseling and group therapy Accepts private insurance, [place], [place] [phone] [address] [place], [place]  [facility] Offers individual and family counseling Offers LGBTQ services Accepts private insurance and private pay [phone] [address] [place], [place] [address]   [facility] Full service practice for adult and pediatric patients Monday-Friday; 8:00a - 5:00p [phone] (Multiple locations)  [facility] [employer] individual counseling, group therapy, and outpatient detox Accepts private insurance [phone] [address] [place], [place]  [facility] Individual counseling Accepts Medicare, private insurance [phone] [address] [place], [place] [address]  [employer] Individual counseling [employer], private insurance [phone] [address] [place], [place] [address]  [facility]  Offers substance  abuse Intensive Outpatient Program (several hours a day, several times a week) [phone], or [phone] [place], [place]

## 2023-12-03 NOTE — Care Management (Signed)
 [facility] Care Management...  Writer met with patient..  Patient reported that she wants to discharge today 12/03/2023  Per providers patient will be discharging today.  Writer provided patient with out patient community resources  [place] spoke with probation officer Duwaine [phone]  Writer advised Officer Duwaine of patient disposition, provided information to complete of phone interview and out patient resources

## 2024-03-10 ENCOUNTER — Emergency Department (HOSPITAL_COMMUNITY): Payer: MEDICAID

## 2024-03-10 ENCOUNTER — Emergency Department (HOSPITAL_COMMUNITY)
Admission: EM | Admit: 2024-03-10 | Discharge: 2024-03-10 | Disposition: A | Discharge status: Home / Self Care | Payer: MEDICAID | Attending: Emergency Medicine | Admitting: Emergency Medicine

## 2024-03-10 DIAGNOSIS — Y92410 Unspecified street and highway as the place of occurrence of the external cause: Secondary | ICD-10-CM | POA: Insufficient documentation

## 2024-03-10 DIAGNOSIS — R10A1 Flank pain, right side: Secondary | ICD-10-CM | POA: Insufficient documentation

## 2024-03-10 DIAGNOSIS — R0789 Other chest pain: Secondary | ICD-10-CM

## 2024-03-10 DIAGNOSIS — R1011 Right upper quadrant pain: Secondary | ICD-10-CM | POA: Insufficient documentation

## 2024-03-10 DIAGNOSIS — R1031 Right lower quadrant pain: Secondary | ICD-10-CM | POA: Insufficient documentation

## 2024-03-10 MED ORDER — CYCLOBENZAPRINE HCL 10 MG PO TABS: 10.0000 mg | ORAL_TABLET | Freq: Two times a day (BID) | ORAL | 0 refills | Status: AC | PRN
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
## 2024-03-13 ENCOUNTER — Emergency Department (HOSPITAL_COMMUNITY)
Admission: EM | Admit: 2024-03-13 | Discharge: 2024-03-13 | Disposition: A | Discharge status: Home / Self Care | Payer: MEDICAID | Attending: Emergency Medicine | Admitting: Emergency Medicine

## 2024-03-13 ENCOUNTER — Other Ambulatory Visit: Payer: Self-pay

## 2024-03-13 ENCOUNTER — Emergency Department (HOSPITAL_COMMUNITY): Payer: MEDICAID

## 2024-03-13 DIAGNOSIS — G5791 Unspecified mononeuropathy of right lower limb: Secondary | ICD-10-CM | POA: Insufficient documentation

## 2024-03-13 MED ORDER — ACETAMINOPHEN 500 MG PO TABS
1000.0000 mg | ORAL_TABLET | Freq: Once | ORAL | Status: AC
  Administered 2024-03-13: 1000 mg via ORAL
  Filled 2024-03-13: qty 2

## 2024-03-13 MED ORDER — ONDANSETRON 8 MG PO TBDP
8.0000 mg | ORAL_TABLET | Freq: Once | ORAL | Status: AC
  Administered 2024-03-13: 8 mg via ORAL
  Filled 2024-03-13: qty 1

## 2024-03-13 NOTE — ED Notes (Signed)
 Pulse found in foot with doppler and marked w/X

## 2024-03-13 NOTE — ED Notes (Signed)
Patient refuses blood work

## 2024-03-13 NOTE — ED Notes (Signed)
Ortho on the way 

## 2024-03-13 NOTE — ED Triage Notes (Signed)
 Pt c/o waking up this AM with right foot pain, pt reports that it feels like it is asleep, pt denies injury or trauma to area

## 2024-03-13 NOTE — Progress Notes (Signed)
 Orthopedic Tech Progress Note Patient Details:  NALLA PURDY 03-02-1980 [id number]  Ortho Devices Type of Ortho Device: Crutches, CAM walker Ortho Device/Splint Location: right cam boot applied. crutchees adjusted and instructed on use Ortho Device/Splint Interventions: Ordered, Application, Adjustment   Post Interventions Patient Tolerated: Well Instructions Provided: Adjustment of device, Care of device  Waylan Thom Loving 03/13/2024, 6:46 PM

## 2024-03-13 NOTE — Discharge Instructions (Signed)
 You were seen in the emergency department for your numbness to your foot.  This is likely due to a pinched nerve in your leg.  I have given you a boot to wear for comfort and support and I have given you a list of exercises that you can do.  You should follow-up with orthopedics for further testing and evaluation.  You should return to the emergency department if you develop severe pain in your foot, your toes turn blue or if you have any other new or concerning symptoms.
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
